# Patient Record
Sex: Female | Born: 1970 | Race: White | Hispanic: No | Marital: Married | Smoking: Never smoker
Health system: Southern US, Community
[De-identification: ages and names within clinical notes are randomized; demographics above are authoritative.]

## PROBLEM LIST (undated history)

## (undated) DIAGNOSIS — G4700 Insomnia, unspecified: Secondary | ICD-10-CM

## (undated) DIAGNOSIS — G8929 Other chronic pain: Secondary | ICD-10-CM

## (undated) DIAGNOSIS — S86112A Strain of other muscle(s) and tendon(s) of posterior muscle group at lower leg level, left leg, initial encounter: Secondary | ICD-10-CM

## (undated) DIAGNOSIS — F419 Anxiety disorder, unspecified: Secondary | ICD-10-CM

## (undated) DIAGNOSIS — I10 Essential (primary) hypertension: Secondary | ICD-10-CM

## (undated) HISTORY — PX: ABDOMINAL HYSTERECTOMY: SHX81

---

## 2005-08-25 ENCOUNTER — Emergency Department: Payer: Self-pay | Admitting: Emergency Medicine

## 2007-02-01 ENCOUNTER — Ambulatory Visit: Payer: Self-pay | Admitting: Family Medicine

## 2008-01-17 ENCOUNTER — Emergency Department: Payer: Self-pay | Admitting: Emergency Medicine

## 2012-08-30 ENCOUNTER — Ambulatory Visit: Payer: Self-pay | Admitting: Obstetrics & Gynecology

## 2012-08-30 LAB — CBC
HCT: 40.3 % (ref 35.0–47.0)
HGB: 13.9 g/dL (ref 12.0–16.0)
MCHC: 34.4 g/dL (ref 32.0–36.0)
MCV: 89 fL (ref 80–100)
RBC: 4.53 10*6/uL (ref 3.80–5.20)
RDW: 13.1 % (ref 11.5–14.5)

## 2012-09-11 ENCOUNTER — Ambulatory Visit: Payer: Self-pay | Admitting: Obstetrics & Gynecology

## 2012-09-12 LAB — HEMOGLOBIN: HGB: 11.7 g/dL — ABNORMAL LOW (ref 12.0–16.0)

## 2012-09-13 LAB — PATHOLOGY REPORT

## 2015-01-02 NOTE — Op Note (Signed)
PATIENT NAME:  Kelly Stephenson, Kelly L MR#:  [id number] DATE OF BIRTH:  [birth year]  DATE OF PROCEDURE:  09/11/2012  PREOPERATIVE DIAGNOSIS: Menorrhagia and fibroid uterus.  POSTOPERATIVE DIAGNOSIS: Menorrhagia and fibroid uterus.  PROCEDURE: Laparoscopic supracervical hysterectomy.   SURGEON: Dierdre Searles. Paul Breanne Olvera, MD  ASSISTANT: Kate SablePhilip Rosenow, MD  ANESTHESIA: General.   ESTIMATED BLOOD LOSS: Minimal.   COMPLICATIONS: None.   FINDINGS: Fibroid uterus, normal ovaries without cysts.   DISPOSITION: To recovery in stable condition.   TECHNIQUE: The patient is prepped and draped in the usual sterile fashion, after adequate anesthesia is obtained, in the dorsal lithotomy position. A Foley catheter is inserted and a sponge stick is placed vaginally for manipulation purposes.   Attention is then turned to the abdomen where a Veress needle is inserted through a 5 mm infraumbilical incision after Marcaine is used to anesthetize the skin. Veress needle placement is confirmed using the hanging drop technique and the abdomen is then insufflated with CO2 gas. A 5 mm trocar is then inserted under direct visualization with the laparoscope with no injuries or bleeding noted and no adhesions visualized. An 11 mm trocar is placed in the right lower quadrant and a 5 mm trocar is placed in the left lower quadrant lateral to the inferior epigastric blood vessels with no injuries or bleeding noted. The patient was placed in Trendelenburg positioning.  The  right and left uterine ovarian blood vessels and their ligaments are carefully coagulated and cut using the bipolar cautery device and then the Harmonic scalpel is used to dissect the adnexa free from the uterus. The round ligaments are carefully coagulated and cut with the Harmonic scalpel and dissection is carried down to the level of the uterine arteries. The uterine arteries are cauterized using the Kleppinger bipolar cautery device and then the bladder is inferiorly  dissected and retracted. The uterus is then amputated with approximately 1 cm of cervix left in place. The endocervical canal is cauterized. Excellent hemostasis is noted and the pelvic cavity is irrigated with aspiration of all fluid.   A morcellator device is placed through the right lower quadrant incision and the uterus is removed by morcellation technique without complication. Inspection of all organs reveals no apparent injury to ureter, bowel, bladder or other structures and all fluid is aspirated. Interceed is placed over the cervical stump. The right lower quadrant rectus fascial incision is closed with 0 Vicryl suture. Trocars are removed after gas is expelled and then the skin is closed with Dermabond. The patient goes to the recovery room in stable condition. All sponge, instrument and needle counts are correct.  ____________________________ R. Annamarie MajorPaul Thorin Starner, MD rph:sb D: 09/11/2012 08:35:13 ET T: 09/11/2012 10:23:21 ET JOB#: [id number]  cc: Dierdre Searles. Paul Orlene Salmons, MD, <Dictator> Nadara MustardOBERT P Faris Coolman MD ELECTRONICALLY SIGNED 09/12/2012 8:56

## 2015-07-21 ENCOUNTER — Emergency Department
Admission: EM | Admit: 2015-07-21 | Discharge: 2015-07-21 | Disposition: A | Discharge status: Home / Self Care | Payer: BLUE CROSS/BLUE SHIELD | Attending: Emergency Medicine | Admitting: Emergency Medicine

## 2015-07-21 ENCOUNTER — Emergency Department: Payer: BLUE CROSS/BLUE SHIELD

## 2015-07-21 DIAGNOSIS — K0889 Other specified disorders of teeth and supporting structures: Secondary | ICD-10-CM | POA: Diagnosis not present

## 2015-07-21 DIAGNOSIS — I10 Essential (primary) hypertension: Secondary | ICD-10-CM | POA: Diagnosis not present

## 2015-07-21 DIAGNOSIS — R079 Chest pain, unspecified: Secondary | ICD-10-CM

## 2015-07-21 DIAGNOSIS — G43809 Other migraine, not intractable, without status migrainosus: Secondary | ICD-10-CM

## 2015-07-21 DIAGNOSIS — H53143 Visual discomfort, bilateral: Secondary | ICD-10-CM

## 2015-07-21 DIAGNOSIS — R51 Headache: Secondary | ICD-10-CM | POA: Diagnosis present

## 2015-07-21 HISTORY — DX: Essential (primary) hypertension: I10

## 2015-07-21 LAB — COMPREHENSIVE METABOLIC PANEL
ALBUMIN: 4.1 g/dL (ref 3.5–5.0)
ALT: 17 U/L (ref 14–54)
ANION GAP: 8 (ref 5–15)
AST: 18 U/L (ref 15–41)
Alkaline Phosphatase: 77 U/L (ref 38–126)
BUN: 11 mg/dL (ref 6–20)
CO2: 25 mmol/L (ref 22–32)
Calcium: 9.1 mg/dL (ref 8.9–10.3)
Chloride: 104 mmol/L (ref 101–111)
Creatinine, Ser: 0.72 mg/dL (ref 0.44–1.00)
GFR calc Af Amer: >60 mL/min (ref >60)
GFR calc non Af Amer: >60 mL/min (ref >60)
GLUCOSE: 100 mg/dL — AB (ref 65–99)
Potassium: 3.8 mmol/L (ref 3.5–5.1)
SODIUM: 137 mmol/L (ref 135–145)
Total Bilirubin: 0.6 mg/dL (ref 0.3–1.2)
Total Protein: 7.4 g/dL (ref 6.5–8.1)

## 2015-07-21 LAB — CBC
HCT: 42.2 % (ref 35.0–47.0)
HEMOGLOBIN: 14.2 g/dL (ref 12.0–16.0)
MCH: 28.9 pg (ref 26.0–34.0)
MCHC: 33.8 g/dL (ref 32.0–36.0)
MCV: 85.6 fL (ref 80.0–100.0)
Platelets: 250 10*3/uL (ref 150–440)
RBC: 4.92 MIL/uL (ref 3.80–5.20)
RDW: 12.8 % (ref 11.5–14.5)
WBC: 8.5 10*3/uL (ref 3.6–11.0)

## 2015-07-21 LAB — TROPONIN I: Troponin I: <0.03 ng/mL (ref <0.031)

## 2015-07-21 MED ORDER — GI COCKTAIL ~~LOC~~
30.0000 mL | Freq: Once | ORAL | Status: AC
  Administered 2015-07-21: 30 mL via ORAL

## 2015-07-21 MED ORDER — KETOROLAC TROMETHAMINE 60 MG/2ML IM SOLN
INTRAMUSCULAR | Status: AC
  Administered 2015-07-21: 60 mg
  Filled 2015-07-21: qty 2

## 2015-07-21 MED ORDER — KETOROLAC TROMETHAMINE 10 MG PO TABS: 10.0000 mg | ORAL_TABLET | Freq: Three times a day (TID) | ORAL | Status: DC | PRN

## 2015-07-21 MED ORDER — KETOROLAC TROMETHAMINE 30 MG/ML IJ SOLN: 60.0000 mg | Freq: Once | INTRAMUSCULAR | Status: AC

## 2015-07-21 MED ORDER — ONDANSETRON 4 MG PO TBDP
4.0000 mg | ORAL_TABLET | Freq: Once | ORAL | Status: AC
  Administered 2015-07-21: 4 mg via ORAL

## 2015-07-21 MED ORDER — GI COCKTAIL ~~LOC~~
ORAL | Status: AC
  Filled 2015-07-21: qty 30

## 2015-07-21 MED ORDER — ONDANSETRON 4 MG PO TBDP
ORAL_TABLET | ORAL | Status: AC
  Filled 2015-07-21: qty 1

## 2015-07-21 NOTE — Discharge Instructions (Signed)
Please return to the emergency department if he develops severe pain, nausea or vomiting, chest pain, shortness of breath, palpitations, fainting, fever or any other symptoms concerning to you.

## 2015-07-21 NOTE — ED Notes (Signed)
New onset of chest pain that started 1 hour ago.  Patient with history of hypertension. Patient denies nausea or vomiting but does report shortness of breath.

## 2015-07-21 NOTE — ED Provider Notes (Signed)
[facility] Emergency Department Provider Note  ____________________________________________  Time seen: Approximately 7:15 PM  I have reviewed the triage vital signs and the nursing notes.   HISTORY  Chief Complaint Chest Pain    HPI Kelly Stephenson is a 44 y.o. female with a history of migraines presenting with headache and chest pain. Patient reports that earlier today she developed a progressively worsening headache with associated photophobia. She did not have any nausea or vomiting, fever, neck stiffness, trauma area did this felt similar to her previous headaches. Now, she has developed some pain in tooth #15. Later on in the day she developed some midsternal chest "tightness" without associated diaphoresis, nausea or vomiting, or shortness of breath. It did not radiate. It was not worse with deep breaths. She has not had any cough or cold symptoms, fever or chills. She had a large meal from this given L for lunch prior to the onset of her chest pain.   Past Medical History  Diagnosis Date  . Hypertension     There are no active problems to display for this patient.  family history is significant for father who died of an MI at age 44.  Social history is significant for no smoking, very occasional alcohol, no cocaine.  History reviewed. No pertinent past surgical history.  Current Outpatient Rx  Name  Route  Sig  Dispense  Refill  . ketorolac (TORADOL) 10 MG tablet   Oral   Take 1 tablet (10 mg total) by mouth every 8 (eight) hours as needed for moderate pain (with food).   15 tablet   0     Allergies Review of patient's allergies indicates no known allergies.  History reviewed. No pertinent family history.  Social History Social History  Substance Use Topics  . Smoking status: Never Smoker   . Smokeless tobacco: None  . Alcohol Use: No    Review of Systems Constitutional: No fever/chills. No lightheadedness or syncope. Eyes: No  visual changes. No blurred or double vision. ENT: No sore throat. Cardiovascular: Positive chest pain, negative palpitations. Respiratory: Denies shortness of breath.  No cough. Gastrointestinal: No abdominal pain.  No nausea, no vomiting.  No diarrhea.  No constipation. Genitourinary: Negative for dysuria. Musculoskeletal: Negative for back pain. Skin: Negative for rash. Neurological: Positive for headaches, negative focal weakness or numbness. No visual or speech changes. No difficulty walking.  10-point ROS otherwise negative.  ____________________________________________   PHYSICAL EXAM:  VITAL SIGNS: ED Triage Vitals  Enc Vitals Group     BP 07/21/15 1703 182/103 mmHg     Pulse Rate 07/21/15 1703 89     Resp 07/21/15 1703 18     Temp 07/21/15 1703 97.8 F (36.6 C)     Temp src --      SpO2 07/21/15 1703 99 %     Weight 07/21/15 1703 175 lb (79.379 kg)     Height 07/21/15 1703 5\' 4"  (1.626 m)     Head Cir --      Peak Flow --      Pain Score 07/21/15 1704 5     Pain Loc --      Pain Edu? --      Excl. in GC? --     Constitutional: Patient is lying in the stretcher mildly uncomfortable. She is alert and oriented and able to answer questions appropriately.  Eyes: Conjunctivae are normal.  EOMI. no nystagmus. PERRLA. Head: Atraumatic.  Nose: No congestion/rhinnorhea. Mouth/Throat: Mucous membranes  are moist. Tooth #15 has some mild discoloration but no evidence of abscess. The patient has no trismus or drooling. No posterior pharyngeal erythema, tonsillar swelling or exudate. Uvula is midline.  Neck: No stridor.  Supple.  No meningismus. Cardiovascular: Normal rate, regular rhythm. No murmurs, rubs or gallops.  Respiratory: Normal respiratory effort.  No retractions. Lungs CTAB.  No wheezes, rales or ronchi. Gastrointestinal: Soft and nontender. No distention. No peritoneal signs. Musculoskeletal: No LE edema.  Neurologic:  Normal speech and language. No gross focal  neurologic deficits are appreciated.  Skin:  Skin is warm, dry and intact. No rash noted. Psychiatric: Mood and affect are normal. Speech and behavior are normal.  Normal judgement.  ____________________________________________   LABS (all labs ordered are listed, but only abnormal results are displayed)  Labs Reviewed  COMPREHENSIVE METABOLIC PANEL - Abnormal; Notable for the following:    Glucose, Bld 100 (*)    All other components within normal limits  CBC  TROPONIN I   ____________________________________________  EKG  ED ECG REPORT I, Rockne Menghini, the attending physician, personally viewed and interpreted this ECG.   Date: 07/21/2015  EKG Time: 1701  Rate: 88  Rhythm: normal sinus rhythm  Axis: Normal  Intervals:none  ST&T Change: Nonspecific T-wave inversions in V1.  ____________________________________________  RADIOLOGY  Dg Chest 2 View  07/21/2015  CLINICAL DATA:  New onset chest pain 1 hour ago. History of hypertension. EXAM: CHEST  2 VIEW COMPARISON:  None. FINDINGS: The cardiac silhouette, mediastinal and hilar contours are normal. The lungs are clear. No pleural effusion. The bony thorax is intact. IMPRESSION: No acute cardiopulmonary findings. Electronically Signed   By: Rudie Meyer M.D.   On: 07/21/2015 17:48    ____________________________________________   PROCEDURES  Procedure(s) performed: None  Critical Care performed: No ____________________________________________   INITIAL IMPRESSION / ASSESSMENT AND PLAN / ED COURSE  Pertinent labs & imaging results that were available during my care of the patient were reviewed by me and considered in my medical decision making (see chart for details).  44 y.o. female with no significant cardiac risk factors other than apparent with early CAD presenting with substernal chest pain after the onset of a typical headache. Clinically it does not appear that the patient has risk for meningitis or  subarachnoid hemorrhage. Her EKG is reassuring for no ischemic changes. She has negative chest x-ray. I will plan symptomatic treatment and if she improves, plan discharge home with close PMD follow-up. I have recommended that she get an outpatient stress test for risk stratification.  ----------------------------------------- 8:13 PM on 07/21/2015 -----------------------------------------  The patient's headache is significantly improved and now down to a 3 out of 10. Her nausea has resolved. And her chest pain is completely gone after GI cocktail. Given that this is the first on the patient has had any chest discomfort, I will not start her on a PPI or H2 blocker. However I did tell her that if she persists in her chest pain that she needs to follow up with her primary care physician for further evaluation. I will talk to the cardiologist on-call to help the patient set up an outpatient stress test. Plan discharge. Patient and her husband understand follow-up instructions and return precautions.  ____________________________________________  FINAL CLINICAL IMPRESSION(S) / ED DIAGNOSES  Final diagnoses:  Chest pain, unspecified chest pain type  Other migraine without status migrainosus, not intractable  Photophobia of both eyes      NEW MEDICATIONS STARTED DURING THIS VISIT:  New Prescriptions   KETOROLAC (TORADOL) 10 MG TABLET    Take 1 tablet (10 mg total) by mouth every 8 (eight) hours as needed for moderate pain (with food).     Rockne Menghini, MD 07/21/15 2016

## 2019-11-21 DIAGNOSIS — R519 Headache, unspecified: Secondary | ICD-10-CM | POA: Diagnosis not present

## 2019-11-21 DIAGNOSIS — M62830 Muscle spasm of back: Secondary | ICD-10-CM | POA: Diagnosis not present

## 2019-11-21 DIAGNOSIS — M9902 Segmental and somatic dysfunction of thoracic region: Secondary | ICD-10-CM | POA: Diagnosis not present

## 2019-11-21 DIAGNOSIS — M9901 Segmental and somatic dysfunction of cervical region: Secondary | ICD-10-CM | POA: Diagnosis not present

## 2020-07-15 DIAGNOSIS — M9902 Segmental and somatic dysfunction of thoracic region: Secondary | ICD-10-CM | POA: Diagnosis not present

## 2020-07-15 DIAGNOSIS — M62830 Muscle spasm of back: Secondary | ICD-10-CM | POA: Diagnosis not present

## 2020-07-15 DIAGNOSIS — M9903 Segmental and somatic dysfunction of lumbar region: Secondary | ICD-10-CM | POA: Diagnosis not present

## 2020-07-15 DIAGNOSIS — M9907 Segmental and somatic dysfunction of upper extremity: Secondary | ICD-10-CM | POA: Diagnosis not present

## 2020-07-15 DIAGNOSIS — R293 Abnormal posture: Secondary | ICD-10-CM | POA: Diagnosis not present

## 2020-07-15 DIAGNOSIS — M62411 Contracture of muscle, right shoulder: Secondary | ICD-10-CM | POA: Diagnosis not present

## 2020-07-16 DIAGNOSIS — M9902 Segmental and somatic dysfunction of thoracic region: Secondary | ICD-10-CM | POA: Diagnosis not present

## 2020-07-16 DIAGNOSIS — M62830 Muscle spasm of back: Secondary | ICD-10-CM | POA: Diagnosis not present

## 2020-07-16 DIAGNOSIS — R293 Abnormal posture: Secondary | ICD-10-CM | POA: Diagnosis not present

## 2020-07-16 DIAGNOSIS — M9907 Segmental and somatic dysfunction of upper extremity: Secondary | ICD-10-CM | POA: Diagnosis not present

## 2020-07-16 DIAGNOSIS — M62411 Contracture of muscle, right shoulder: Secondary | ICD-10-CM | POA: Diagnosis not present

## 2020-07-16 DIAGNOSIS — M9903 Segmental and somatic dysfunction of lumbar region: Secondary | ICD-10-CM | POA: Diagnosis not present

## 2020-07-20 DIAGNOSIS — M62411 Contracture of muscle, right shoulder: Secondary | ICD-10-CM | POA: Diagnosis not present

## 2020-07-20 DIAGNOSIS — R293 Abnormal posture: Secondary | ICD-10-CM | POA: Diagnosis not present

## 2020-07-20 DIAGNOSIS — M9907 Segmental and somatic dysfunction of upper extremity: Secondary | ICD-10-CM | POA: Diagnosis not present

## 2020-07-20 DIAGNOSIS — M9903 Segmental and somatic dysfunction of lumbar region: Secondary | ICD-10-CM | POA: Diagnosis not present

## 2020-07-20 DIAGNOSIS — M9902 Segmental and somatic dysfunction of thoracic region: Secondary | ICD-10-CM | POA: Diagnosis not present

## 2020-07-20 DIAGNOSIS — M62830 Muscle spasm of back: Secondary | ICD-10-CM | POA: Diagnosis not present

## 2020-07-23 DIAGNOSIS — M9907 Segmental and somatic dysfunction of upper extremity: Secondary | ICD-10-CM | POA: Diagnosis not present

## 2020-07-23 DIAGNOSIS — M9902 Segmental and somatic dysfunction of thoracic region: Secondary | ICD-10-CM | POA: Diagnosis not present

## 2020-07-23 DIAGNOSIS — M9903 Segmental and somatic dysfunction of lumbar region: Secondary | ICD-10-CM | POA: Diagnosis not present

## 2020-07-23 DIAGNOSIS — R293 Abnormal posture: Secondary | ICD-10-CM | POA: Diagnosis not present

## 2020-07-23 DIAGNOSIS — M62411 Contracture of muscle, right shoulder: Secondary | ICD-10-CM | POA: Diagnosis not present

## 2020-07-23 DIAGNOSIS — M62830 Muscle spasm of back: Secondary | ICD-10-CM | POA: Diagnosis not present

## 2020-07-27 DIAGNOSIS — M62411 Contracture of muscle, right shoulder: Secondary | ICD-10-CM | POA: Diagnosis not present

## 2020-07-27 DIAGNOSIS — M62830 Muscle spasm of back: Secondary | ICD-10-CM | POA: Diagnosis not present

## 2020-07-27 DIAGNOSIS — M9903 Segmental and somatic dysfunction of lumbar region: Secondary | ICD-10-CM | POA: Diagnosis not present

## 2020-07-27 DIAGNOSIS — R293 Abnormal posture: Secondary | ICD-10-CM | POA: Diagnosis not present

## 2020-07-27 DIAGNOSIS — M9907 Segmental and somatic dysfunction of upper extremity: Secondary | ICD-10-CM | POA: Diagnosis not present

## 2020-07-27 DIAGNOSIS — M9902 Segmental and somatic dysfunction of thoracic region: Secondary | ICD-10-CM | POA: Diagnosis not present

## 2020-08-04 DIAGNOSIS — M62830 Muscle spasm of back: Secondary | ICD-10-CM | POA: Diagnosis not present

## 2020-08-04 DIAGNOSIS — M9907 Segmental and somatic dysfunction of upper extremity: Secondary | ICD-10-CM | POA: Diagnosis not present

## 2020-08-04 DIAGNOSIS — M9903 Segmental and somatic dysfunction of lumbar region: Secondary | ICD-10-CM | POA: Diagnosis not present

## 2020-08-04 DIAGNOSIS — M9902 Segmental and somatic dysfunction of thoracic region: Secondary | ICD-10-CM | POA: Diagnosis not present

## 2020-08-04 DIAGNOSIS — R293 Abnormal posture: Secondary | ICD-10-CM | POA: Diagnosis not present

## 2020-08-04 DIAGNOSIS — M62411 Contracture of muscle, right shoulder: Secondary | ICD-10-CM | POA: Diagnosis not present

## 2020-08-11 DIAGNOSIS — M9902 Segmental and somatic dysfunction of thoracic region: Secondary | ICD-10-CM | POA: Diagnosis not present

## 2020-08-11 DIAGNOSIS — M9903 Segmental and somatic dysfunction of lumbar region: Secondary | ICD-10-CM | POA: Diagnosis not present

## 2020-08-11 DIAGNOSIS — M9907 Segmental and somatic dysfunction of upper extremity: Secondary | ICD-10-CM | POA: Diagnosis not present

## 2020-08-11 DIAGNOSIS — R293 Abnormal posture: Secondary | ICD-10-CM | POA: Diagnosis not present

## 2020-08-11 DIAGNOSIS — M62411 Contracture of muscle, right shoulder: Secondary | ICD-10-CM | POA: Diagnosis not present

## 2020-08-11 DIAGNOSIS — M62830 Muscle spasm of back: Secondary | ICD-10-CM | POA: Diagnosis not present

## 2020-08-18 DIAGNOSIS — R293 Abnormal posture: Secondary | ICD-10-CM | POA: Diagnosis not present

## 2020-08-18 DIAGNOSIS — M62411 Contracture of muscle, right shoulder: Secondary | ICD-10-CM | POA: Diagnosis not present

## 2020-08-18 DIAGNOSIS — M9903 Segmental and somatic dysfunction of lumbar region: Secondary | ICD-10-CM | POA: Diagnosis not present

## 2020-08-18 DIAGNOSIS — M62830 Muscle spasm of back: Secondary | ICD-10-CM | POA: Diagnosis not present

## 2020-08-18 DIAGNOSIS — M9907 Segmental and somatic dysfunction of upper extremity: Secondary | ICD-10-CM | POA: Diagnosis not present

## 2020-08-18 DIAGNOSIS — M9902 Segmental and somatic dysfunction of thoracic region: Secondary | ICD-10-CM | POA: Diagnosis not present

## 2020-10-07 ENCOUNTER — Telehealth: Payer: Self-pay | Admitting: Family Medicine

## 2020-10-07 ENCOUNTER — Ambulatory Visit: Payer: Self-pay | Admitting: *Deleted

## 2020-10-07 NOTE — Telephone Encounter (Signed)
Copied from CRM [id number]. Topic: Appointment Scheduling - Scheduling Inquiry for Clinic >> Oct 07, 2020 12:00 PM Randol Kern wrote: Reason for CRM: Pt is a former patient of Dr. Sherrie Mustache and would like to reestablish Best contact: [phone]

## 2020-10-07 NOTE — Telephone Encounter (Signed)
Positive covid . Caller requesting to review quarantine guidelines. Called patient and no answer, left voicemail to call back at #[phone].

## 2020-10-07 NOTE — Telephone Encounter (Signed)
That's fine

## 2020-10-07 NOTE — Telephone Encounter (Signed)
Attempted to contact patient Left VM to return call to [phone] to speak with a nurse abut her questions.Marland Kitchen

## 2020-10-08 ENCOUNTER — Ambulatory Visit (INDEPENDENT_AMBULATORY_CARE_PROVIDER_SITE_OTHER): Payer: BC Managed Care – PPO | Admitting: Family Medicine

## 2020-10-08 ENCOUNTER — Encounter: Payer: Self-pay | Admitting: Family Medicine

## 2020-10-08 VITALS — Ht 64.0 in | Wt 183.0 lb

## 2020-10-08 DIAGNOSIS — U071 COVID-19: Secondary | ICD-10-CM | POA: Diagnosis not present

## 2020-10-08 DIAGNOSIS — I10 Essential (primary) hypertension: Secondary | ICD-10-CM

## 2020-10-08 NOTE — Progress Notes (Signed)
MyChart Video Visit    Virtual Visit via Video Note   This visit type was conducted due to national recommendations for restrictions regarding the COVID-19 Pandemic (e.g. social distancing) in an effort to limit this patient's exposure and mitigate transmission in our community. This patient is at least at moderate risk for complications without adequate follow up. This format is felt to be most appropriate for this patient at this time. Physical exam was limited by quality of the video and audio technology used for the visit.   Patient location: Home Provider location: Home  I discussed the limitations of evaluation and management by telemedicine and the availability of in person appointments. The patient expressed understanding and agreed to proceed.  Patient: Kelly Stephenson   DOB: Apr 30, 1971   50 y.o. Female  MRN: [id number] Visit Date: 10/08/2020  Today's healthcare provider: Shirlee Latch, MD   Chief Complaint  Patient presents with  . Covid Positive   Subjective    HPI   Kelly Stephenson presents for follow up of her COVID symptoms. She is not vaccinated. She was exposed to COVID at her work and first noticed a sore throat thursday 1/20. The next day she reported a significant headache that last two days and was relieved by Excedrin Migraine. Also reported some fatigue and congestion. Over the last week her symptoms have improved and mostly resolved. Today, she only notes some facial pressure and congestion. She is concerned if she is doing everything right and would like more information on the risks of it progressing to a severe infection.   She would also like to reestablish care at [facility] with former PCP Dr Sherrie Mustache.    Past Medical History:  Diagnosis Date  . Hypertension     Medications: Outpatient Medications Prior to Visit  Medication Sig  . [DISCONTINUED] ketorolac (TORADOL) 10 MG tablet Take 1 tablet (10 mg total) by mouth every 8 (eight) hours as needed for  moderate pain (with food).   No facility-administered medications prior to visit.   Family History  Problem Relation Age of Onset  . High blood pressure Father   . Heart Problems Father    Past Surgical History:  Procedure Laterality Date  . ABDOMINAL HYSTERECTOMY     Social History   Substance and Sexual Activity  Sexual Activity Not on file   Tobacco Use: Low Risk   . Smoking Tobacco Use: Never Smoker  . Smokeless Tobacco Use: Never Used    Review of Systems  Constitutional: Positive for fatigue.  HENT: Positive for congestion, postnasal drip, rhinorrhea and sinus pressure.       Objective    Ht 5\' 4"  (1.626 m)   Wt 183 lb (83 kg)   BMI 31.41 kg/m    Physical Exam   General: Well appearing woman in her living room with her dog on camera. Oriented to conversation and speaking in full sentences without breaks Lungs: Normal Work of breathing Cardiac: extremities appear pink and well-perfused Abdomen: non-distended Derm: No apparent rashes  Psych: Alert and Oriented, mood and affect appropriate to situation.    Assessment & Plan     Problem List Items Addressed This Visit      Other   COVID-19 - Primary    One week since onset of symptoms Confirmed by + PCR Only mild congestion and fatigue persist Counseled on natural course, symptomatic management, and return precautions Discussed quarantine window       Other Visit Diagnoses  Primary hypertension        - will f/u and repeat at next office visit in person.  Not currently on any meds.   Return in about 2 months (around 12/06/2020) for CPE.     I discussed the assessment and treatment plan with the patient. The patient was provided an opportunity to ask questions and all were answered. The patient agreed with the plan and demonstrated an understanding of the instructions.   The patient was advised to call back or seek an in-person evaluation if the symptoms worsen or if the condition fails to  improve as anticipated.  I provided 30 minutes of non-face-to-face time during this encounter.  Patient seen along with MS[employer] student [facility]. I personally evaluated this patient along with the student, and verified all aspects of the history, physical exam, and medical decision making as documented by the student. I agree with the student's documentation and have made all necessary edits.  Saraann Enneking, Marzella Schlein, MD, MPH [facility]

## 2020-10-08 NOTE — Patient Instructions (Signed)

## 2020-10-08 NOTE — Progress Notes (Signed)
See note from Preston Roche, medical student, attested by me from same date of service 

## 2020-10-08 NOTE — Assessment & Plan Note (Addendum)
One week since onset of symptoms Confirmed by + PCR Only mild congestion and fatigue persist Counseled on natural course, symptomatic management, and return precautions Discussed quarantine window

## 2020-10-12 ENCOUNTER — Ambulatory Visit: Payer: Self-pay | Admitting: Family Medicine

## 2020-11-16 ENCOUNTER — Ambulatory Visit: Payer: Self-pay | Admitting: Family Medicine

## 2020-11-27 ENCOUNTER — Encounter: Payer: Self-pay | Admitting: Family Medicine

## 2020-11-27 ENCOUNTER — Other Ambulatory Visit: Payer: Self-pay

## 2020-11-27 ENCOUNTER — Ambulatory Visit (INDEPENDENT_AMBULATORY_CARE_PROVIDER_SITE_OTHER): Payer: BC Managed Care – PPO | Admitting: Family Medicine

## 2020-11-27 VITALS — BP 183/115 | HR 96 | Temp 97.5°F | Ht 64.0 in | Wt 191.0 lb

## 2020-11-27 DIAGNOSIS — F5101 Primary insomnia: Secondary | ICD-10-CM

## 2020-11-27 DIAGNOSIS — R4586 Emotional lability: Secondary | ICD-10-CM

## 2020-11-27 DIAGNOSIS — R030 Elevated blood-pressure reading, without diagnosis of hypertension: Secondary | ICD-10-CM | POA: Diagnosis not present

## 2020-11-27 DIAGNOSIS — R61 Generalized hyperhidrosis: Secondary | ICD-10-CM

## 2020-11-27 DIAGNOSIS — F419 Anxiety disorder, unspecified: Secondary | ICD-10-CM

## 2020-11-27 DIAGNOSIS — R221 Localized swelling, mass and lump, neck: Secondary | ICD-10-CM

## 2020-11-27 MED ORDER — METOPROLOL SUCCINATE ER 25 MG PO TB24: 25.0000 mg | ORAL_TABLET | Freq: Every day | ORAL | 3 refills | Status: DC

## 2020-11-27 NOTE — Progress Notes (Signed)
New patient visit   Patient: Kelly Stephenson   DOB: [birth year]   50 y.o. Female  MRN: [id number] Visit Date: 11/27/2020  Today's healthcare provider: Mila Merry, MD   No chief complaint on file.  Subjective    Kelly Stephenson is a 50 y.o. female who presents today as a new patient to reestablish care.  HPI  Pt would like to discuss difficulty sleeping. She states she has had nervousness, insomnia, racing heart, palpitations, night sweats and mood swings for several months. She had covid in January but sx preceded that. She has also felt lump in her throat when she swallows and is tender in the front of her neck. She states he appetite goes up and down. She did lose about 20 pounds a few months ago but has gained it all back, including about 15 pounds in the the last month.   Past Medical History:  Diagnosis Date  . Hypertension    Past Surgical History:  Procedure Laterality Date  . ABDOMINAL HYSTERECTOMY     Family Status  Relation Name Status  . Mother  Alive  . Father  Deceased at age 91       heart attack  . Sister  Alive  . Brother  Alive  . Daughter  Alive  . Son  Alive  . Sister  Alive  . Daughter  Alive   Family History  Problem Relation Age of Onset  . High blood pressure Father   . Heart Problems Father    Social History   Socioeconomic History  . Marital status: Married    Spouse name: Not on file  . Number of children: Not on file  . Years of education: Not on file  . Highest education level: Not on file  Occupational History  . Not on file  Tobacco Use  . Smoking status: Never Smoker  . Smokeless tobacco: Never Used  Vaping Use  . Vaping Use: Never used  Substance and Sexual Activity  . Alcohol use: Yes    Alcohol/week: 1.0 standard drink    Types: 1 Glasses of wine per week  . Drug use: No  . Sexual activity: Not on file  Other Topics Concern  . Not on file  Social History Narrative   3 children   Social Determinants of Health    Financial Resource Strain: Not on file  Food Insecurity: Not on file  Transportation Needs: Not on file  Physical Activity: Not on file  Stress: Not on file  Social Connections: Not on file   No outpatient medications prior to visit.   No facility-administered medications prior to visit.   No Known Allergies   There is no immunization history on file for this patient.  Health Maintenance  Topic Date Due  . Hepatitis C Screening  Never done  . COVID-19 Vaccine (1) Never done  . HIV Screening  Never done  . PAP SMEAR-Modifier  Never done  . TETANUS/TDAP  09/12/2014  . COLONOSCOPY (Pts 45-95yrs Insurance coverage will need to be confirmed)  Never done  . INFLUENZA VACCINE  12/10/2020 (Originally 04/12/2020)  . HPV VACCINES  Aged Out    Patient Care Team: Malva Limes, MD as PCP - General (Family Medicine)  Review of Systems  Constitutional: Positive for diaphoresis, fatigue, fever and unexpected weight change.  Eyes: Positive for redness.  Respiratory: Positive for apnea, cough, choking, chest tightness and shortness of breath.   Cardiovascular: Positive for chest pain and  palpitations.  Gastrointestinal: Positive for abdominal distention, blood in stool, constipation, diarrhea, nausea, rectal pain and vomiting.  Endocrine: Positive for polydipsia.  Neurological: Positive for dizziness, speech difficulty, light-headedness and headaches.  Psychiatric/Behavioral: Positive for agitation, decreased concentration, dysphoric mood and sleep disturbance. The patient is nervous/anxious.   All other systems reviewed and are negative.     Objective    BP (!) 183/115 (BP Location: Right Arm, Patient Position: Sitting, Cuff Size: Normal)   Pulse 96   Temp (!) 97.5 F (36.4 C) (Temporal)   Ht 5\' 4"  (1.626 m)   Wt 191 lb (86.6 kg)   SpO2 98%   BMI 32.79 kg/m  Physical Exam   General: Appearance:    Mildly obese female in no acute distress  Neck:   Slight fullness and  moderate tenderness of thyroid.   Eyes:    PERRL, conjunctiva/corneas clear, EOM's intact       Lungs:     Clear to auscultation bilaterally, respirations unlabored  Heart:    Normal heart rate. Normal rhythm. No murmurs, rubs, or gallops.   MS:   All extremities are intact.   Neurologic:   Awake, alert, oriented x 3. No apparent focal neurological           defect.        Assessment & Plan      1. Elevated blood pressure reading Differential diagnosis includes essential hypertension, thyroid disorder, perimenopausal, and secondary hypertension.  - EKG 12-Lead - CBC - Comprehensive metabolic panel - TSH - T4, free - FSH  Start - metoprolol succinate (TOPROL-XL) 25 MG 24 hr tablet; Take 1 tablet (25 mg total) by mouth daily.  Dispense: 90 tablet; Refill: 3  Consider addition of calcium channel or alpha-blocker after reviewing labs.    2. Night sweats  - Cortisol  3. Anxiety   4. Primary insomnia   5. Lump in throat   6. Mood swings       The entirety of the information documented in the History of Present Illness, Review of Systems and Physical Exam were personally obtained by me. Portions of this information were initially documented by the CMA and reviewed by me for thoroughness and accuracy.      , MD  [facility] [phone] (phone) [phone] (fax)  [facility]

## 2020-11-28 LAB — COMPREHENSIVE METABOLIC PANEL
ALT: 26 IU/L (ref 0–32)
AST: 22 IU/L (ref 0–40)
Albumin/Globulin Ratio: 1.7 (ref 1.2–2.2)
Albumin: 4.4 g/dL (ref 3.8–4.8)
Alkaline Phosphatase: 130 IU/L — ABNORMAL HIGH (ref 44–121)
BUN/Creatinine Ratio: 15 (ref 9–23)
BUN: 14 mg/dL (ref 6–24)
Bilirubin Total: 0.4 mg/dL (ref 0.0–1.2)
CO2: 21 mmol/L (ref 20–29)
Calcium: 9.3 mg/dL (ref 8.7–10.2)
Chloride: 101 mmol/L (ref 96–106)
Creatinine, Ser: 0.92 mg/dL (ref 0.57–1.00)
Globulin, Total: 2.6 g/dL (ref 1.5–4.5)
Glucose: 95 mg/dL (ref 65–99)
Potassium: 4.1 mmol/L (ref 3.5–5.2)
Sodium: 138 mmol/L (ref 134–144)
Total Protein: 7 g/dL (ref 6.0–8.5)
eGFR: 76 mL/min/{1.73_m2} (ref >59)

## 2020-11-28 LAB — CBC
Hematocrit: 44.1 % (ref 34.0–46.6)
Hemoglobin: 14.6 g/dL (ref 11.1–15.9)
MCH: 28 pg (ref 26.6–33.0)
MCHC: 33.1 g/dL (ref 31.5–35.7)
MCV: 85 fL (ref 79–97)
Platelets: 305 10*3/uL (ref 150–450)
RBC: 5.22 x10E6/uL (ref 3.77–5.28)
RDW: 13.6 % (ref 11.7–15.4)
WBC: 7.8 10*3/uL (ref 3.4–10.8)

## 2020-11-28 LAB — T4, FREE: Free T4: 1.29 ng/dL (ref 0.82–1.77)

## 2020-11-28 LAB — FOLLICLE STIMULATING HORMONE: FSH: 84.6 m[IU]/mL

## 2020-11-28 LAB — TSH: TSH: 2.01 u[IU]/mL (ref 0.450–4.500)

## 2020-11-28 LAB — CORTISOL: Cortisol: 8.8 ug/dL

## 2020-11-30 ENCOUNTER — Telehealth: Payer: Self-pay

## 2020-11-30 NOTE — Telephone Encounter (Signed)
Patient notified of lab results and PCP recommendation- she voices understanding

## 2020-11-30 NOTE — Telephone Encounter (Signed)
-----   Message from Malva Limes, MD sent at 11/30/2020  7:56 AM EDT ----- Labs are all completely normal. She should check blood pressure once daily and follow up her as scheduled next week.  Let me know if she sees any blood pressure over 200/120 before then

## 2020-12-11 ENCOUNTER — Ambulatory Visit
Admission: RE | Admit: 2020-12-11 | Discharge: 2020-12-11 | Disposition: A | Discharge status: Home / Self Care | Payer: BC Managed Care – PPO | Attending: Family Medicine | Admitting: Family Medicine

## 2020-12-11 ENCOUNTER — Other Ambulatory Visit: Payer: Self-pay

## 2020-12-11 ENCOUNTER — Ambulatory Visit
Admission: RE | Admit: 2020-12-11 | Discharge: 2020-12-11 | Disposition: A | Discharge status: Home / Self Care | Payer: BC Managed Care – PPO | Source: Ambulatory Visit | Attending: Family Medicine | Admitting: Family Medicine

## 2020-12-11 ENCOUNTER — Ambulatory Visit (INDEPENDENT_AMBULATORY_CARE_PROVIDER_SITE_OTHER): Payer: BC Managed Care – PPO | Admitting: Family Medicine

## 2020-12-11 VITALS — BP 168/108 | HR 88 | Ht 65.0 in | Wt 188.8 lb

## 2020-12-11 DIAGNOSIS — L409 Psoriasis, unspecified: Secondary | ICD-10-CM | POA: Insufficient documentation

## 2020-12-11 DIAGNOSIS — R079 Chest pain, unspecified: Secondary | ICD-10-CM | POA: Insufficient documentation

## 2020-12-11 DIAGNOSIS — R030 Elevated blood-pressure reading, without diagnosis of hypertension: Secondary | ICD-10-CM | POA: Diagnosis not present

## 2020-12-11 DIAGNOSIS — I10 Essential (primary) hypertension: Secondary | ICD-10-CM | POA: Diagnosis not present

## 2020-12-11 MED ORDER — AMLODIPINE BESYLATE 5 MG PO TABS: 5.0000 mg | ORAL_TABLET | Freq: Every day | ORAL | 3 refills | Status: DC

## 2020-12-11 MED ORDER — TRIAMCINOLONE ACETONIDE 0.5 % EX OINT: 1.0000 "application " | TOPICAL_OINTMENT | Freq: Two times a day (BID) | CUTANEOUS | 0 refills | Status: DC

## 2020-12-11 NOTE — Progress Notes (Signed)
Established patient visit   Patient: Kelly Stephenson   DOB: 11-Aug-1971   50 y.o. Female  MRN: [id number] Visit Date: 12/11/2020  Today's healthcare provider: Mila Merry, MD   No chief complaint on file.  Subjective    HPI  Hypertension, follow-up  BP Readings from Last 3 Encounters:  11/27/20 (!) 183/115  07/21/15 (!) 151/91   Wt Readings from Last 3 Encounters:  11/27/20 191 lb (86.6 kg)  10/08/20 183 lb (83 kg)  07/21/15 175 lb (79.4 kg)     She was last seen for hypertension 2 weeks ago.  BP at that visit was 183/115. Management since that visit includes starting metoprolol succinate (TOPROL-XL) 25 MG 24 hr tablet; Take 1 tablet (25 mg total) by mouth daily. She reports excellent compliance with treatment. She is not having side effects.  She is following a Regular diet. She is exercising. She does not smoke.  Use of agents associated with hypertension: none.    Outside blood pressures are n/a. Symptoms: Yes chest pain Yes chest pressure  Yes palpitations Yes syncope  Yes dyspnea No orthopnea  No paroxysmal nocturnal dyspnea No lower extremity edema   Pertinent labs: No results found for: CHOL, HDL, LDLCALC, LDLDIRECT, TRIG, CHOLHDL Lab Results  Component Value Date   NA 138 11/27/2020   K 4.1 11/27/2020   CREATININE 0.92 11/27/2020   GFRNONAA >60 07/21/2015   GFRAA >60 07/21/2015   GLUCOSE 95 11/27/2020     The ASCVD Risk score Denman George DC Jr., et al., 2013) failed to calculate for the following reasons:   Cannot find a previous HDL lab   Cannot find a previous total cholesterol lab   She also reports having frequent episodes of substernal chest pain sometimes radiating to right. No specific triggers. Has been having a lot more anxiety and feels like she is having panic attacks. No dyspnea. She does report family history of heart disease which her worried.   ---------------------------------------------------------------------------------------------------      Medications: Outpatient Medications Prior to Visit  Medication Sig  . metoprolol succinate (TOPROL-XL) 25 MG 24 hr tablet Take 1 tablet (25 mg total) by mouth daily.   No facility-administered medications prior to visit.    Last CBC Lab Results  Component Value Date   WBC 7.8 11/27/2020   HGB 14.6 11/27/2020   HCT 44.1 11/27/2020   MCV 85 11/27/2020   MCH 28.0 11/27/2020   RDW 13.6 11/27/2020   PLT 305 11/27/2020   Last metabolic panel Lab Results  Component Value Date   GLUCOSE 95 11/27/2020   NA 138 11/27/2020   K 4.1 11/27/2020   CL 101 11/27/2020   CO2 21 11/27/2020   BUN 14 11/27/2020   CREATININE 0.92 11/27/2020   GFRNONAA >60 07/21/2015   GFRAA >60 07/21/2015   CALCIUM 9.3 11/27/2020   PROT 7.0 11/27/2020   ALBUMIN 4.4 11/27/2020   LABGLOB 2.6 11/27/2020   AGRATIO 1.7 11/27/2020   BILITOT 0.4 11/27/2020   ALKPHOS 130 (H) 11/27/2020   AST 22 11/27/2020   ALT 26 11/27/2020   ANIONGAP 8 07/21/2015   Last thyroid functions Lab Results  Component Value Date   TSH 2.010 11/27/2020       Objective    BP (!) 168/108   Pulse 88   Ht 5\' 5"  (1.651 m)   Wt 188 lb 12.8 oz (85.6 kg)   SpO2 98%   BMI 31.42 kg/m     Physical Exam  General: Appearance:    Obese female in no acute distress  Eyes:    PERRL, conjunctiva/corneas clear, EOM's intact       Lungs:     Clear to auscultation bilaterally, respirations unlabored  Heart:    Normal heart rate. Normal rhythm. No murmurs, rubs, or gallops.   MS:   All extremities are intact.   Neurologic:   Awake, alert, oriented x 3. No apparent focal neurological           defect.         Assessment & Plan     1. Chest pain, unspecified type Her father had extensive vascular disease including MI and died in his late 9s. She had normal EKG at recent visit.  - DG Chest 2 View; Future - Ambulatory referral  to Cardiology  Consider chest CT if xray is non-diagnostic.   2. Psoriasis She reports several lesions on extremities that usually flare during the allergy seasons.  - triamcinolone ointment (KENALOG) 0.5 %; Apply 1 application topically 2 (two) times daily.  Dispense: 30 g; Refill: 0  3. Primary hypertension Somewhat improved with metoprolol. Will add- amLODipine (NORVASC) 5 MG tablet; Take 1 tablet (5 mg total) by mouth daily.  Dispense: 30 tablet; Refill: 3    she is also having menopausal symptoms and anxiety attacks, will consider addition of SNRI once her blood pressure is better controlled. Clonidine may also be an option.      The entirety of the information documented in the History of Present Illness, Review of Systems and Physical Exam were personally obtained by me. Portions of this information were initially documented by the CMA and reviewed by me for thoroughness and accuracy.      Mila Merry, MD  [facility] [phone] (phone) [phone] (fax)  [facility]

## 2020-12-11 NOTE — Patient Instructions (Addendum)
Go to the [facility] on [address] for chest Xray  

## 2020-12-15 ENCOUNTER — Other Ambulatory Visit: Payer: Self-pay | Admitting: Family Medicine

## 2020-12-15 DIAGNOSIS — R079 Chest pain, unspecified: Secondary | ICD-10-CM

## 2020-12-15 DIAGNOSIS — S86112A Strain of other muscle(s) and tendon(s) of posterior muscle group at lower leg level, left leg, initial encounter: Secondary | ICD-10-CM | POA: Diagnosis not present

## 2020-12-16 DIAGNOSIS — S86112A Strain of other muscle(s) and tendon(s) of posterior muscle group at lower leg level, left leg, initial encounter: Secondary | ICD-10-CM | POA: Diagnosis not present

## 2020-12-24 ENCOUNTER — Other Ambulatory Visit: Payer: Self-pay

## 2020-12-24 ENCOUNTER — Ambulatory Visit
Admission: RE | Admit: 2020-12-24 | Discharge: 2020-12-24 | Disposition: A | Discharge status: Home / Self Care | Payer: BC Managed Care – PPO | Source: Ambulatory Visit | Attending: Family Medicine | Admitting: Family Medicine

## 2020-12-24 DIAGNOSIS — R079 Chest pain, unspecified: Secondary | ICD-10-CM | POA: Diagnosis not present

## 2020-12-24 DIAGNOSIS — R911 Solitary pulmonary nodule: Secondary | ICD-10-CM | POA: Diagnosis not present

## 2020-12-24 MED ORDER — IOHEXOL 300 MG/ML  SOLN
75.0000 mL | Freq: Once | INTRAMUSCULAR | Status: AC | PRN
  Administered 2020-12-24: 75 mL via INTRAVENOUS

## 2020-12-28 ENCOUNTER — Encounter: Payer: Self-pay | Admitting: Family Medicine

## 2020-12-28 DIAGNOSIS — K760 Fatty (change of) liver, not elsewhere classified: Secondary | ICD-10-CM | POA: Insufficient documentation

## 2021-01-04 ENCOUNTER — Ambulatory Visit: Payer: BC Managed Care – PPO | Admitting: Cardiology

## 2021-01-05 ENCOUNTER — Encounter: Payer: Self-pay | Admitting: Cardiology

## 2021-01-05 DIAGNOSIS — S86112A Strain of other muscle(s) and tendon(s) of posterior muscle group at lower leg level, left leg, initial encounter: Secondary | ICD-10-CM | POA: Diagnosis not present

## 2021-01-26 DIAGNOSIS — S86112D Strain of other muscle(s) and tendon(s) of posterior muscle group at lower leg level, left leg, subsequent encounter: Secondary | ICD-10-CM | POA: Diagnosis not present

## 2021-01-27 DIAGNOSIS — S86112D Strain of other muscle(s) and tendon(s) of posterior muscle group at lower leg level, left leg, subsequent encounter: Secondary | ICD-10-CM | POA: Diagnosis not present

## 2021-02-02 DIAGNOSIS — S86112A Strain of other muscle(s) and tendon(s) of posterior muscle group at lower leg level, left leg, initial encounter: Secondary | ICD-10-CM | POA: Diagnosis not present

## 2021-02-11 DIAGNOSIS — S86112A Strain of other muscle(s) and tendon(s) of posterior muscle group at lower leg level, left leg, initial encounter: Secondary | ICD-10-CM | POA: Diagnosis not present

## 2021-02-17 DIAGNOSIS — S86112A Strain of other muscle(s) and tendon(s) of posterior muscle group at lower leg level, left leg, initial encounter: Secondary | ICD-10-CM | POA: Diagnosis not present

## 2021-02-23 DIAGNOSIS — S86112D Strain of other muscle(s) and tendon(s) of posterior muscle group at lower leg level, left leg, subsequent encounter: Secondary | ICD-10-CM | POA: Diagnosis not present

## 2021-02-25 DIAGNOSIS — S86112A Strain of other muscle(s) and tendon(s) of posterior muscle group at lower leg level, left leg, initial encounter: Secondary | ICD-10-CM | POA: Diagnosis not present

## 2021-07-07 ENCOUNTER — Other Ambulatory Visit: Payer: Self-pay | Admitting: Orthopedic Surgery

## 2021-07-07 DIAGNOSIS — M67911 Unspecified disorder of synovium and tendon, right shoulder: Secondary | ICD-10-CM

## 2021-07-07 DIAGNOSIS — M7531 Calcific tendinitis of right shoulder: Secondary | ICD-10-CM | POA: Diagnosis not present

## 2021-07-07 DIAGNOSIS — M542 Cervicalgia: Secondary | ICD-10-CM | POA: Diagnosis not present

## 2021-07-21 ENCOUNTER — Ambulatory Visit
Admission: EM | Admit: 2021-07-21 | Discharge: 2021-07-21 | Disposition: A | Discharge status: Home / Self Care | Payer: BC Managed Care – PPO | Attending: Emergency Medicine | Admitting: Emergency Medicine

## 2021-07-21 ENCOUNTER — Encounter: Payer: Self-pay | Admitting: Emergency Medicine

## 2021-07-21 ENCOUNTER — Other Ambulatory Visit: Payer: Self-pay

## 2021-07-21 DIAGNOSIS — B349 Viral infection, unspecified: Secondary | ICD-10-CM

## 2021-07-21 DIAGNOSIS — I10 Essential (primary) hypertension: Secondary | ICD-10-CM | POA: Diagnosis not present

## 2021-07-21 LAB — POCT INFLUENZA A/B
Influenza A, POC: NEGATIVE
Influenza B, POC: NEGATIVE

## 2021-07-21 LAB — POCT RAPID STREP A (OFFICE): Rapid Strep A Screen: NEGATIVE

## 2021-07-21 NOTE — ED Triage Notes (Addendum)
Pt c/o ST and cough x 2 weeks. She developed a fever yesterday. At home Covid test was negative.

## 2021-07-21 NOTE — Discharge Instructions (Addendum)
Your strep test is negative.  Your flu test is negative.    Your COVID test is pending.  You should self quarantine until the test result is back.    Take Tylenol or ibuprofen as needed for fever or discomfort.  Rest and keep yourself hydrated.    Follow-up with your primary care provider if your symptoms are not improving.    Your blood pressure is elevated today at 170/100.  Please have this rechecked by your primary care provider in 1-2 weeks.

## 2021-07-21 NOTE — ED Provider Notes (Signed)
Kelly Stephenson    CSN: [id number] Arrival date & time: 07/21/21  1158      History   Chief Complaint Chief Complaint  Patient presents with   Fever   Cough   Sore Throat    HPI Kelly Stephenson is a 50 y.o. female.  Patient presents with fever, sore throat, congestion, cough since yesterday.  1 episode of vomiting yesterday.  Treatment at home with OTC cold and flu medication.  She had similar symptoms 2 weeks ago but those improved except for some residual cough.  She denies rash, shortness of breath, diarrhea, or other symptoms.  Patient requests strep and flu testing.  Her medical history includes hypertension.  The history is provided by the patient and medical records.   Past Medical History:  Diagnosis Date   Hypertension     Patient Active Problem List   Diagnosis Date Noted   Hepatic steatosis 12/28/2020   Primary hypertension 12/11/2020   Psoriasis 12/11/2020   COVID-19 10/08/2020    Past Surgical History:  Procedure Laterality Date   ABDOMINAL HYSTERECTOMY     CESAREAN SECTION      OB History   No obstetric history on file.      Home Medications    Prior to Admission medications   Medication Sig Start Date End Date Taking? Authorizing Provider  amLODipine (NORVASC) 5 MG tablet Take 1 tablet (5 mg total) by mouth daily. 12/11/20   Malva Limes, MD  metoprolol succinate (TOPROL-XL) 25 MG 24 hr tablet Take 1 tablet (25 mg total) by mouth daily. 11/27/20   Malva Limes, MD  triamcinolone ointment (KENALOG) 0.5 % Apply 1 application topically 2 (two) times daily. 12/11/20   Malva Limes, MD    Family History Family History  Problem Relation Age of Onset   Osteoporosis Mother    High blood pressure Father    Heart Problems Father    Diabetes Father    Hypertension Father    Stroke Father    Heart attack Father    Depression Sister    Diabetes Sister    Hypertension Sister    Hypertension Maternal Aunt    Obesity Maternal Aunt     Hypertension Maternal Uncle    Kidney disease Maternal Uncle    Glaucoma Maternal Uncle    Stroke Paternal Uncle    Stroke Maternal Grandfather    Stroke Other     Social History Social History   Tobacco Use   Smoking status: Never   Smokeless tobacco: Never  Vaping Use   Vaping Use: Never used  Substance Use Topics   Alcohol use: Yes    Alcohol/week: 1.0 standard drink    Types: 1 Glasses of wine per week   Drug use: No     Allergies   Patient has no known allergies.   Review of Systems Review of Systems  Constitutional:  Positive for fever. Negative for chills.  HENT:  Positive for congestion and sore throat. Negative for ear pain.   Respiratory:  Positive for cough. Negative for shortness of breath.   Cardiovascular:  Negative for chest pain and palpitations.  Gastrointestinal:  Positive for vomiting. Negative for abdominal pain and diarrhea.  Skin:  Negative for color change and rash.  All other systems reviewed and are negative.   Physical Exam Triage Vital Signs ED Triage Vitals [07/21/21 1235]  Enc Vitals Group     BP      Pulse Rate 92  Resp      Temp 98.8 F (37.1 C)     Temp Source Oral     SpO2 97 %     Weight      Height      Head Circumference      Peak Flow      Pain Score      Pain Loc      Pain Edu?      Excl. in GC?    No data found.  Updated Vital Signs BP (!) 170/100 (BP Location: Left Arm)   Pulse 92   Temp 98.8 F (37.1 C) (Oral)   SpO2 97%   Visual Acuity Right Eye Distance:   Left Eye Distance:   Bilateral Distance:    Right Eye Near:   Left Eye Near:    Bilateral Near:     Physical Exam Vitals and nursing note reviewed.  Constitutional:      General: She is not in acute distress.    Appearance: She is well-developed.  HENT:     Head: Normocephalic and atraumatic.     Right Ear: Tympanic membrane normal.     Left Ear: Tympanic membrane normal.     Nose: Nose normal.     Mouth/Throat:     Mouth: Mucous  membranes are moist.     Pharynx: Posterior oropharyngeal erythema present.  Eyes:     Conjunctiva/sclera: Conjunctivae normal.  Cardiovascular:     Rate and Rhythm: Normal rate and regular rhythm.     Heart sounds: Normal heart sounds.  Pulmonary:     Effort: Pulmonary effort is normal. No respiratory distress.     Breath sounds: Normal breath sounds.  Abdominal:     Palpations: Abdomen is soft.     Tenderness: There is no abdominal tenderness.  Musculoskeletal:     Cervical back: Neck supple.  Skin:    General: Skin is warm and dry.  Neurological:     Mental Status: She is alert.  Psychiatric:        Mood and Affect: Mood normal.        Behavior: Behavior normal.     UC Treatments / Results  Labs (all labs ordered are listed, but only abnormal results are displayed) Labs Reviewed  NOVEL CORONAVIRUS, NAA  POCT RAPID STREP A (OFFICE)  POCT INFLUENZA A/B    EKG   Radiology No results found.  Procedures Procedures (including critical care time)  Medications Ordered in UC Medications - No data to display  Initial Impression / Assessment and Plan / UC Course  I have reviewed the triage vital signs and the nursing notes.  Pertinent labs & imaging results that were available during my care of the patient were reviewed by me and considered in my medical decision making (see chart for details).  Viral illness.  Elevated blood pressure reading with hypertension.  Rapid strep negative.  Rapid flu negative.  COVID pending.  Instructed patient to self quarantine per CDC guidelines.  Discussed symptomatic treatment including Tylenol or ibuprofen, rest, hydration.  Instructed patient to follow up with PCP if symptoms are not improving.  Discussed with patient that her blood pressure is elevated today and needs to be rechecked by her PCP in 1 to 2 weeks.  Education provided on managing hypertension and instructed patient to avoid OTC medications that can elevate her blood  pressure.  Suggested Coricidin HBP for her current symptoms.  Patient agrees to plan of care.    Final Clinical  Impressions(s) / UC Diagnoses   Final diagnoses:  Viral illness  Elevated blood pressure reading in office with diagnosis of hypertension     Discharge Instructions      Your strep test is negative.  Your flu test is negative.    Your COVID test is pending.  You should self quarantine until the test result is back.    Take Tylenol or ibuprofen as needed for fever or discomfort.  Rest and keep yourself hydrated.    Follow-up with your primary care provider if your symptoms are not improving.    Your blood pressure is elevated today at 170/100.  Please have this rechecked by your primary care provider in 1-2 weeks.          ED Prescriptions   None    PDMP not reviewed this encounter.   Mickie Bail, NP 07/21/21 1347

## 2021-07-22 ENCOUNTER — Other Ambulatory Visit: Payer: BC Managed Care – PPO

## 2021-07-22 LAB — NOVEL CORONAVIRUS, NAA: SARS-CoV-2, NAA: NOT DETECTED

## 2021-07-22 LAB — SARS-COV-2, NAA 2 DAY TAT

## 2021-07-23 ENCOUNTER — Encounter: Payer: Self-pay | Admitting: Emergency Medicine

## 2021-07-23 ENCOUNTER — Other Ambulatory Visit: Payer: Self-pay

## 2021-07-23 ENCOUNTER — Emergency Department: Payer: BC Managed Care – PPO

## 2021-07-23 ENCOUNTER — Emergency Department
Admission: EM | Admit: 2021-07-23 | Discharge: 2021-07-23 | Disposition: A | Discharge status: Home / Self Care | Payer: BC Managed Care – PPO | Attending: Emergency Medicine | Admitting: Emergency Medicine

## 2021-07-23 ENCOUNTER — Ambulatory Visit: Payer: Self-pay | Admitting: *Deleted

## 2021-07-23 DIAGNOSIS — J0100 Acute maxillary sinusitis, unspecified: Secondary | ICD-10-CM | POA: Insufficient documentation

## 2021-07-23 DIAGNOSIS — I10 Essential (primary) hypertension: Secondary | ICD-10-CM | POA: Insufficient documentation

## 2021-07-23 DIAGNOSIS — R059 Cough, unspecified: Secondary | ICD-10-CM | POA: Diagnosis not present

## 2021-07-23 DIAGNOSIS — J40 Bronchitis, not specified as acute or chronic: Secondary | ICD-10-CM | POA: Diagnosis not present

## 2021-07-23 DIAGNOSIS — Z20822 Contact with and (suspected) exposure to covid-19: Secondary | ICD-10-CM | POA: Diagnosis not present

## 2021-07-23 DIAGNOSIS — R079 Chest pain, unspecified: Secondary | ICD-10-CM | POA: Diagnosis not present

## 2021-07-23 DIAGNOSIS — Z8616 Personal history of COVID-19: Secondary | ICD-10-CM | POA: Insufficient documentation

## 2021-07-23 DIAGNOSIS — Z79899 Other long term (current) drug therapy: Secondary | ICD-10-CM | POA: Diagnosis not present

## 2021-07-23 LAB — URINALYSIS, COMPLETE (UACMP) WITH MICROSCOPIC
Bilirubin Urine: NEGATIVE
Glucose, UA: NEGATIVE mg/dL
Hgb urine dipstick: NEGATIVE
Ketones, ur: NEGATIVE mg/dL
Leukocytes,Ua: NEGATIVE
Nitrite: NEGATIVE
Protein, ur: NEGATIVE mg/dL
Specific Gravity, Urine: 1.005 (ref 1.005–1.030)
pH: 7 (ref 5.0–8.0)

## 2021-07-23 LAB — BASIC METABOLIC PANEL
Anion gap: 7 (ref 5–15)
BUN: 12 mg/dL (ref 6–20)
CO2: 25 mmol/L (ref 22–32)
Calcium: 9.2 mg/dL (ref 8.9–10.3)
Chloride: 105 mmol/L (ref 98–111)
Creatinine, Ser: 0.69 mg/dL (ref 0.44–1.00)
GFR, Estimated: >60 mL/min (ref >60)
Glucose, Bld: 103 mg/dL — ABNORMAL HIGH (ref 70–99)
Potassium: 3.9 mmol/L (ref 3.5–5.1)
Sodium: 137 mmol/L (ref 135–145)

## 2021-07-23 LAB — RESP PANEL BY RT-PCR (FLU A&B, COVID) ARPGX2
Influenza A by PCR: NEGATIVE
Influenza B by PCR: NEGATIVE
SARS Coronavirus 2 by RT PCR: NEGATIVE

## 2021-07-23 LAB — CBC
HCT: 42.1 % (ref 36.0–46.0)
Hemoglobin: 14.4 g/dL (ref 12.0–15.0)
MCH: 28.1 pg (ref 26.0–34.0)
MCHC: 34.2 g/dL (ref 30.0–36.0)
MCV: 82.2 fL (ref 80.0–100.0)
Platelets: 321 10*3/uL (ref 150–400)
RBC: 5.12 MIL/uL — ABNORMAL HIGH (ref 3.87–5.11)
RDW: 12.7 % (ref 11.5–15.5)
WBC: 10.2 10*3/uL (ref 4.0–10.5)
nRBC: 0 % (ref 0.0–0.2)

## 2021-07-23 LAB — TROPONIN I (HIGH SENSITIVITY)
Troponin I (High Sensitivity): 5 ng/L (ref <18)
Troponin I (High Sensitivity): 4 ng/L (ref <18)

## 2021-07-23 MED ORDER — HYDROCOD POLST-CPM POLST ER 10-8 MG/5ML PO SUER: 5.0000 mL | Freq: Two times a day (BID) | ORAL | 0 refills | Status: DC | PRN

## 2021-07-23 MED ORDER — PREDNISONE 20 MG PO TABS: 40.0000 mg | ORAL_TABLET | Freq: Every day | ORAL | 0 refills | Status: AC

## 2021-07-23 MED ORDER — AMOXICILLIN-POT CLAVULANATE 875-125 MG PO TABS
1.0000 | ORAL_TABLET | Freq: Once | ORAL | Status: AC
  Administered 2021-07-23: 1 via ORAL
  Filled 2021-07-23: qty 1

## 2021-07-23 MED ORDER — AZITHROMYCIN 250 MG PO TABS: ORAL_TABLET | ORAL | 0 refills | Status: DC

## 2021-07-23 MED ORDER — AMOXICILLIN-POT CLAVULANATE 875-125 MG PO TABS: 1.0000 | ORAL_TABLET | Freq: Two times a day (BID) | ORAL | 0 refills | Status: AC

## 2021-07-23 MED ORDER — PREDNISONE 20 MG PO TABS
60.0000 mg | ORAL_TABLET | Freq: Once | ORAL | Status: AC
  Administered 2021-07-23: 60 mg via ORAL
  Filled 2021-07-23: qty 3

## 2021-07-23 MED ORDER — ONDANSETRON 4 MG PO TBDP
4.0000 mg | ORAL_TABLET | Freq: Once | ORAL | Status: AC
  Administered 2021-07-23: 4 mg via ORAL
  Filled 2021-07-23: qty 1

## 2021-07-23 NOTE — Telephone Encounter (Signed)
Reason for Disposition  Chest pain  (Exception: MILD central chest pain, present only when coughing)  Answer Assessment - Initial Assessment Questions 1. ONSET: "When did the cough begin?"      Pt calling in c/o coughing up thick green mucus.   Started for 2 weeks ago today.   2. SEVERITY: "How bad is the cough today?"      Bad   It keeps me up at night 3. SPUTUM: "Describe the color of your sputum" (none, dry cough; clear, white, yellow, green)     Green mucus.   I went to walk in 2 days ago.   No fever then.   Test negative for flu and strep.   Covid test not back yet.   They said to treat symptoms.   I'm getting worse I'm can't catch my breath, and vomiting too. 4. HEMOPTYSIS: "Are you coughing up any blood?" If so ask: "How much?" (flecks, streaks, tablespoons, etc.)     I have blood in mucus day before yesterday when I coughed up a lot of mucus.   No blood since then 5. DIFFICULTY BREATHING: "Are you having difficulty breathing?" If Yes, ask: "How bad is it?" (e.g., mild, moderate, severe)    - MILD: No SOB at rest, mild SOB with walking, speaks normally in sentences, can lie down, no retractions, pulse < 100.    - MODERATE: SOB at rest, SOB with minimal exertion and prefers to sit, cannot lie down flat, speaks in phrases, mild retractions, audible wheezing, pulse 100-120.    - SEVERE: Very SOB at rest, speaks in single words, struggling to breathe, sitting hunched forward, retractions, pulse > 120      I'm short of breath.   I'm sitting up in a recliner.   When I lay down I hear a crackle.    I can't lay down.   6. FEVER: "Do you have a fever?" If Yes, ask: "What is your temperature, how was it measured, and when did it start?"     I don't have fever.  I've checked it twice today. My urine smells like ammonia.      I have hypertension 7. CARDIAC HISTORY: "Do you have any history of heart disease?" (e.g., heart attack, congestive heart failure)      I have hypertension   No heart  problems 8. LUNG HISTORY: "Do you have any history of lung disease?"  (e.g., pulmonary embolus, asthma, emphysema)     No  lung problems 9. PE RISK FACTORS: "Do you have a history of blood clots?" (or: recent major surgery, recent prolonged travel, bedridden)     No 10. OTHER SYMPTOMS: "Do you have any other symptoms?" (e.g., runny nose, wheezing, chest pain)       Difficulty breathing, coughing a lot, runny nose and sore throat and my chest hurts a lot.   The chest pain is tight and really hurts when I cough.   Urine smells like ammonia, frequency, no burning, no lower abd pain.   I vomited when choking and getting choked when I vomit.     11. PREGNANCY: "Is there any chance you are pregnant?" "When was your last menstrual period?"       No 12. TRAVEL: "Have you traveled out of the country in the last month?" (e.g., travel history, exposures)       No exposures or travels.  Protocols used: Cough - Acute Productive-A-AH

## 2021-07-23 NOTE — ED Notes (Signed)
E signature pad did not work. Pt gave verbal consent for discharge. Pt ambulatory to lobby, husband waiting for pt.

## 2021-07-23 NOTE — ED Provider Notes (Signed)
[facility] Emergency Department Provider Note  ____________________________________________   Event Date/Time   First MD Initiated Contact with Patient 07/23/21 1506     (approximate)  I have reviewed the triage vital signs and the nursing notes.   HISTORY  Chief Complaint Cough, Chest Pain, and Shortness of Breath    HPI Kelly Stephenson is a 50 y.o. female  with h/o HTN hepatic steatosis, here with cough, congestion. Pt sx started approx 1.5 weeks ago with cough, nasal congestion, sore throat. Since then, pt has had slight improvement in her sore throat but congestion, cough,a nd significant sputum production has persisted.  She's had intermittent SOB. No nausea, vomiting. Has been taking mucinex with minimal relief.  No specific aggravating factors.  Her sore throat is worse in the mornings.  Chest congestion also appears worse in the morning.  She has history of COVID-19, earlier this year.       Past Medical History:  Diagnosis Date   Hypertension     Patient Active Problem List   Diagnosis Date Noted   Hepatic steatosis 12/28/2020   Primary hypertension 12/11/2020   Psoriasis 12/11/2020   COVID-19 10/08/2020    Past Surgical History:  Procedure Laterality Date   ABDOMINAL HYSTERECTOMY     CESAREAN SECTION      Prior to Admission medications   Medication Sig Start Date End Date Taking? Authorizing Provider  amoxicillin-clavulanate (AUGMENTIN) 875-125 MG tablet Take 1 tablet by mouth 2 (two) times daily for 7 days. 07/23/21 07/30/21 Yes Shaune Pollack, MD  azithromycin (ZITHROMAX Z-PAK) 250 MG tablet Take 2 tablets (500 mg) on  Day 1,  followed by 1 tablet (250 mg) once daily on Days 2 through 5. 07/23/21  Yes Shaune Pollack, MD  chlorpheniramine-HYDROcodone [facility] PENNKINETIC ER) 10-8 MG/5ML SUER Take 5 mLs by mouth every 12 (twelve) hours as needed for cough. 07/23/21  Yes Shaune Pollack, MD  predniSONE (DELTASONE) 20 MG tablet  Take 2 tablets (40 mg total) by mouth daily for 5 days. 07/23/21 07/28/21 Yes Shaune Pollack, MD  amLODipine (NORVASC) 5 MG tablet Take 1 tablet (5 mg total) by mouth daily. 12/11/20   Malva Limes, MD  metoprolol succinate (TOPROL-XL) 25 MG 24 hr tablet Take 1 tablet (25 mg total) by mouth daily. 11/27/20   Malva Limes, MD  triamcinolone ointment (KENALOG) 0.5 % Apply 1 application topically 2 (two) times daily. 12/11/20   Malva Limes, MD    Allergies Patient has no known allergies.  Family History  Problem Relation Age of Onset   Osteoporosis Mother    High blood pressure Father    Heart Problems Father    Diabetes Father    Hypertension Father    Stroke Father    Heart attack Father    Depression Sister    Diabetes Sister    Hypertension Sister    Hypertension Maternal Aunt    Obesity Maternal Aunt    Hypertension Maternal Uncle    Kidney disease Maternal Uncle    Glaucoma Maternal Uncle    Stroke Paternal Uncle    Stroke Maternal Grandfather    Stroke Other     Social History Social History   Tobacco Use   Smoking status: Never   Smokeless tobacco: Never  Vaping Use   Vaping Use: Never used  Substance Use Topics   Alcohol use: Yes    Alcohol/week: 1.0 standard drink    Types: 1 Glasses of wine per week  Drug use: No    Review of Systems  Review of Systems  Constitutional:  Positive for fatigue. Negative for fever.  HENT:  Positive for congestion, postnasal drip, rhinorrhea and sore throat.   Eyes:  Negative for visual disturbance.  Respiratory:  Positive for cough. Negative for shortness of breath.   Cardiovascular:  Negative for chest pain.  Gastrointestinal:  Negative for abdominal pain, diarrhea, nausea and vomiting.  Genitourinary:  Negative for flank pain.  Musculoskeletal:  Negative for back pain and neck pain.  Skin:  Negative for rash and wound.  Neurological:  Negative for weakness.  All other systems reviewed and are negative.    ____________________________________________  PHYSICAL EXAM:      VITAL SIGNS: ED Triage Vitals  Enc Vitals Group     BP 07/23/21 1132 (!) 186/118     Pulse Rate 07/23/21 1132 (!) 105     Resp 07/23/21 1132 18     Temp 07/23/21 1132 98 F (36.7 C)     Temp Source 07/23/21 1132 Oral     SpO2 07/23/21 1132 96 %     Weight 07/23/21 1133 195 lb (88.5 kg)     Height 07/23/21 1133 5\' 4"  (1.626 m)     Head Circumference --      Peak Flow --      Pain Score 07/23/21 1132 7     Pain Loc --      Pain Edu? --      Excl. in [place]? --      Physical Exam Vitals and nursing note reviewed.  Constitutional:      General: She is not in acute distress.    Appearance: She is well-developed.  HENT:     Head: Normocephalic and atraumatic.     Comments: Moderate nasal congestion.  Posterior pharyngeal erythema without tonsillar swelling or exudates.  Uvula is midline. Eyes:     Conjunctiva/sclera: Conjunctivae normal.  Cardiovascular:     Rate and Rhythm: Normal rate and regular rhythm.     Heart sounds: Normal heart sounds. No murmur heard.   No friction rub.  Pulmonary:     Effort: Pulmonary effort is normal. No respiratory distress.     Breath sounds: Examination of the left-lower field reveals rales. Rales present. No wheezing.  Abdominal:     General: There is no distension.     Palpations: Abdomen is soft.     Tenderness: There is no abdominal tenderness.  Musculoskeletal:     Cervical back: Neck supple.  Skin:    General: Skin is warm.     Capillary Refill: Capillary refill takes less than 2 seconds.  Neurological:     Mental Status: She is alert and oriented to person, place, and time.     Motor: No abnormal muscle tone.      ____________________________________________   LABS (all labs ordered are listed, but only abnormal results are displayed)  Labs Reviewed  BASIC METABOLIC PANEL - Abnormal; Notable for the following components:      Result Value   Glucose, Bld  103 (*)    All other components within normal limits  CBC - Abnormal; Notable for the following components:   RBC 5.12 (*)    All other components within normal limits  URINALYSIS, COMPLETE (UACMP) WITH MICROSCOPIC - Abnormal; Notable for the following components:   Color, Urine STRAW (*)    APPearance CLEAR (*)    Bacteria, UA RARE (*)    All other components within  normal limits  RESP PANEL BY RT-PCR (FLU A&B, COVID) ARPGX2  TROPONIN I (HIGH SENSITIVITY)  TROPONIN I (HIGH SENSITIVITY)    ____________________________________________  EKG: Normal sinus rhythm 108, PR 122, QRS 74, QTc 460. No acute ST elevations or depressions. No ischemia or infarct. ________________________________________  RADIOLOGY All imaging, including plain films, CT scans, and ultrasounds, independently reviewed by me, and interpretations confirmed via formal radiology reads.  ED MD interpretation:   CXR: Clear  Official radiology report(s): DG Chest 2 View  Result Date: 07/23/2021 CLINICAL DATA:  Chest pain EXAM: CHEST - 2 VIEW COMPARISON:  The prior chest x-ray 12/11/2020 FINDINGS: The lungs are clear and negative for focal airspace consolidation, pulmonary edema or suspicious pulmonary nodule. No pleural effusion or pneumothorax. Cardiac and mediastinal contours are within normal limits. No acute fracture or lytic or blastic osseous lesions. The visualized upper abdominal bowel gas pattern is unremarkable. IMPRESSION: Negative chest x-ray. Electronically Signed   By: Malachy Moan M.D.   On: 07/23/2021 12:08    ____________________________________________  PROCEDURES   Procedure(s) performed (including Critical Care):  Procedures  ____________________________________________  INITIAL IMPRESSION / MDM / ASSESSMENT AND PLAN / ED COURSE  As part of my medical decision making, I reviewed the following data within the electronic MEDICAL RECORD NUMBER Nursing notes reviewed and incorporated, Old chart  reviewed, Notes from prior ED visits, and [place] Controlled Substance Database       *ETHELENE CLOSSER was evaluated in Emergency Department on 07/23/2021 for the symptoms described in the history of present illness. She was evaluated in the context of the global COVID-19 pandemic, which necessitated consideration that the patient might be at risk for infection with the SARS-CoV-2 virus that causes COVID-19. Institutional protocols and algorithms that pertain to the evaluation of patients at risk for COVID-19 are in a state of rapid change based on information released by regulatory bodies including the CDC and federal and state organizations. These policies and algorithms were followed during the patient's care in the ED.  Some ED evaluations and interventions may be delayed as a result of limited staffing during the pandemic.*     Medical Decision Making: 50 year old female with past medical history as above including hypertension here with cough, nasal congestion, shortness of breath, sputum production.  Likely has ongoing viral URI which I suspect is possibly developed into a bacterial sinusitis with possible bronchitis given the amount of sputum production and drainage.  The time course is also consistent with this.  She is afebrile and overall nontoxic-appearing.  Chest x-ray is clear.  EKG nonischemic.  Troponin negative despite symptoms for several days, do not suspect ACS.  CBC and BMP are unremarkable.  She has no evidence of congestive heart failure.  COVID-negative.  Flu negative.  Given ongoing infectious symptoms with sputum production and drainage, will treat empirically for possible early commune acquired pneumonia not evident on chest x-ray.  Will give a brief course of prednisone for her possible sinus inflammation, questionable history of reactive airways.  She does not appear to be wheezing currently and will hold on albuterol to avoid additional hypertension or  tachycardia.  ____________________________________________  FINAL CLINICAL IMPRESSION(S) / ED DIAGNOSES  Final diagnoses:  Bronchitis  Acute non-recurrent maxillary sinusitis     MEDICATIONS GIVEN DURING THIS VISIT:  Medications  predniSONE (DELTASONE) tablet 60 mg (60 mg Oral Given 07/23/21 1702)  amoxicillin-clavulanate (AUGMENTIN) 875-125 MG per tablet 1 tablet (1 tablet Oral Given 07/23/21 1702)  ondansetron (ZOFRAN-ODT) disintegrating tablet 4  mg (4 mg Oral Given 07/23/21 1702)     ED Discharge Orders          Ordered    predniSONE (DELTASONE) 20 MG tablet  Daily        07/23/21 1747    amoxicillin-clavulanate (AUGMENTIN) 875-125 MG tablet  2 times daily        07/23/21 1747    azithromycin (ZITHROMAX Z-PAK) 250 MG tablet        07/23/21 1747    chlorpheniramine-HYDROcodone (TUSSIONEX PENNKINETIC ER) 10-8 MG/5ML SUER  Every 12 hours PRN        07/23/21 1748             Note:  This document was prepared using Dragon voice recognition software and may include unintentional dictation errors.   Shaune Pollack, MD 07/23/21 [phone]

## 2021-07-23 NOTE — ED Triage Notes (Signed)
Pt here with c/o chest tightness, productive cough and shob for over a week now, no respiratory distress noted in triage. States she [facility] and hasn't gotten the results, was negative for flu and strep at that time. Also c/o urine smelling like ammonia. Denies vomiting, diarrhea. NAD.

## 2021-07-23 NOTE — ED Notes (Signed)
Provided pt with graham crackers to take with antibiotics and medication. Pt reported had not eaten today.

## 2021-07-23 NOTE — Telephone Encounter (Signed)
Pt called in c/o difficulty breathing, tightness in her chest, coughing a lot with green mucus, sore throat, runny nose and stopped up nose, urine that smells like ammonia.   Been sick for 2 weeks now.    See triage notes.  No appts at [facility] so I have referred her to the ED due to the difficulty breathing and tightness in her chest.   Pt agreeable to going and will go to [facility] ED in [place].   Not comfortable with the care at [facility].  I sent my notes to [facility] for Dr. Sherrie Mustache.

## 2021-08-04 ENCOUNTER — Ambulatory Visit
Admission: RE | Admit: 2021-08-04 | Discharge: 2021-08-04 | Disposition: A | Discharge status: Home / Self Care | Payer: BC Managed Care – PPO | Source: Ambulatory Visit | Attending: Orthopedic Surgery | Admitting: Orthopedic Surgery

## 2021-08-04 DIAGNOSIS — M67911 Unspecified disorder of synovium and tendon, right shoulder: Secondary | ICD-10-CM

## 2021-08-04 DIAGNOSIS — M25511 Pain in right shoulder: Secondary | ICD-10-CM | POA: Diagnosis not present

## 2021-08-13 DIAGNOSIS — M7531 Calcific tendinitis of right shoulder: Secondary | ICD-10-CM | POA: Diagnosis not present

## 2021-11-24 DIAGNOSIS — M9902 Segmental and somatic dysfunction of thoracic region: Secondary | ICD-10-CM | POA: Diagnosis not present

## 2021-11-24 DIAGNOSIS — M9901 Segmental and somatic dysfunction of cervical region: Secondary | ICD-10-CM | POA: Diagnosis not present

## 2021-11-24 DIAGNOSIS — M50122 Cervical disc disorder at C5-C6 level with radiculopathy: Secondary | ICD-10-CM | POA: Diagnosis not present

## 2021-11-24 DIAGNOSIS — M546 Pain in thoracic spine: Secondary | ICD-10-CM | POA: Diagnosis not present

## 2021-11-25 DIAGNOSIS — M50122 Cervical disc disorder at C5-C6 level with radiculopathy: Secondary | ICD-10-CM | POA: Diagnosis not present

## 2021-11-25 DIAGNOSIS — M546 Pain in thoracic spine: Secondary | ICD-10-CM | POA: Diagnosis not present

## 2021-11-25 DIAGNOSIS — M9902 Segmental and somatic dysfunction of thoracic region: Secondary | ICD-10-CM | POA: Diagnosis not present

## 2021-11-25 DIAGNOSIS — M9901 Segmental and somatic dysfunction of cervical region: Secondary | ICD-10-CM | POA: Diagnosis not present

## 2021-11-26 DIAGNOSIS — M50122 Cervical disc disorder at C5-C6 level with radiculopathy: Secondary | ICD-10-CM | POA: Diagnosis not present

## 2021-11-26 DIAGNOSIS — M9902 Segmental and somatic dysfunction of thoracic region: Secondary | ICD-10-CM | POA: Diagnosis not present

## 2021-11-26 DIAGNOSIS — M546 Pain in thoracic spine: Secondary | ICD-10-CM | POA: Diagnosis not present

## 2021-11-26 DIAGNOSIS — M9901 Segmental and somatic dysfunction of cervical region: Secondary | ICD-10-CM | POA: Diagnosis not present

## 2021-11-29 DIAGNOSIS — M50122 Cervical disc disorder at C5-C6 level with radiculopathy: Secondary | ICD-10-CM | POA: Diagnosis not present

## 2021-11-29 DIAGNOSIS — M9901 Segmental and somatic dysfunction of cervical region: Secondary | ICD-10-CM | POA: Diagnosis not present

## 2021-11-29 DIAGNOSIS — M9902 Segmental and somatic dysfunction of thoracic region: Secondary | ICD-10-CM | POA: Diagnosis not present

## 2021-11-29 DIAGNOSIS — M546 Pain in thoracic spine: Secondary | ICD-10-CM | POA: Diagnosis not present

## 2021-11-30 DIAGNOSIS — M9902 Segmental and somatic dysfunction of thoracic region: Secondary | ICD-10-CM | POA: Diagnosis not present

## 2021-11-30 DIAGNOSIS — M9901 Segmental and somatic dysfunction of cervical region: Secondary | ICD-10-CM | POA: Diagnosis not present

## 2021-11-30 DIAGNOSIS — M546 Pain in thoracic spine: Secondary | ICD-10-CM | POA: Diagnosis not present

## 2021-11-30 DIAGNOSIS — M50122 Cervical disc disorder at C5-C6 level with radiculopathy: Secondary | ICD-10-CM | POA: Diagnosis not present

## 2021-12-03 DIAGNOSIS — M9902 Segmental and somatic dysfunction of thoracic region: Secondary | ICD-10-CM | POA: Diagnosis not present

## 2021-12-03 DIAGNOSIS — M9901 Segmental and somatic dysfunction of cervical region: Secondary | ICD-10-CM | POA: Diagnosis not present

## 2021-12-03 DIAGNOSIS — M50122 Cervical disc disorder at C5-C6 level with radiculopathy: Secondary | ICD-10-CM | POA: Diagnosis not present

## 2021-12-03 DIAGNOSIS — M546 Pain in thoracic spine: Secondary | ICD-10-CM | POA: Diagnosis not present

## 2021-12-06 DIAGNOSIS — M546 Pain in thoracic spine: Secondary | ICD-10-CM | POA: Diagnosis not present

## 2021-12-06 DIAGNOSIS — M9901 Segmental and somatic dysfunction of cervical region: Secondary | ICD-10-CM | POA: Diagnosis not present

## 2021-12-06 DIAGNOSIS — M9902 Segmental and somatic dysfunction of thoracic region: Secondary | ICD-10-CM | POA: Diagnosis not present

## 2021-12-06 DIAGNOSIS — M50122 Cervical disc disorder at C5-C6 level with radiculopathy: Secondary | ICD-10-CM | POA: Diagnosis not present

## 2021-12-07 DIAGNOSIS — M9902 Segmental and somatic dysfunction of thoracic region: Secondary | ICD-10-CM | POA: Diagnosis not present

## 2021-12-07 DIAGNOSIS — M546 Pain in thoracic spine: Secondary | ICD-10-CM | POA: Diagnosis not present

## 2021-12-07 DIAGNOSIS — M50122 Cervical disc disorder at C5-C6 level with radiculopathy: Secondary | ICD-10-CM | POA: Diagnosis not present

## 2021-12-07 DIAGNOSIS — M9901 Segmental and somatic dysfunction of cervical region: Secondary | ICD-10-CM | POA: Diagnosis not present

## 2021-12-08 DIAGNOSIS — M9902 Segmental and somatic dysfunction of thoracic region: Secondary | ICD-10-CM | POA: Diagnosis not present

## 2021-12-08 DIAGNOSIS — M546 Pain in thoracic spine: Secondary | ICD-10-CM | POA: Diagnosis not present

## 2021-12-08 DIAGNOSIS — M50122 Cervical disc disorder at C5-C6 level with radiculopathy: Secondary | ICD-10-CM | POA: Diagnosis not present

## 2021-12-08 DIAGNOSIS — M9901 Segmental and somatic dysfunction of cervical region: Secondary | ICD-10-CM | POA: Diagnosis not present

## 2021-12-13 DIAGNOSIS — M9902 Segmental and somatic dysfunction of thoracic region: Secondary | ICD-10-CM | POA: Diagnosis not present

## 2021-12-13 DIAGNOSIS — M50122 Cervical disc disorder at C5-C6 level with radiculopathy: Secondary | ICD-10-CM | POA: Diagnosis not present

## 2021-12-13 DIAGNOSIS — M9901 Segmental and somatic dysfunction of cervical region: Secondary | ICD-10-CM | POA: Diagnosis not present

## 2021-12-13 DIAGNOSIS — M546 Pain in thoracic spine: Secondary | ICD-10-CM | POA: Diagnosis not present

## 2021-12-14 DIAGNOSIS — M9901 Segmental and somatic dysfunction of cervical region: Secondary | ICD-10-CM | POA: Diagnosis not present

## 2021-12-14 DIAGNOSIS — M546 Pain in thoracic spine: Secondary | ICD-10-CM | POA: Diagnosis not present

## 2021-12-14 DIAGNOSIS — M9902 Segmental and somatic dysfunction of thoracic region: Secondary | ICD-10-CM | POA: Diagnosis not present

## 2021-12-14 DIAGNOSIS — M50122 Cervical disc disorder at C5-C6 level with radiculopathy: Secondary | ICD-10-CM | POA: Diagnosis not present

## 2021-12-15 DIAGNOSIS — M546 Pain in thoracic spine: Secondary | ICD-10-CM | POA: Diagnosis not present

## 2021-12-15 DIAGNOSIS — M9901 Segmental and somatic dysfunction of cervical region: Secondary | ICD-10-CM | POA: Diagnosis not present

## 2021-12-15 DIAGNOSIS — M9902 Segmental and somatic dysfunction of thoracic region: Secondary | ICD-10-CM | POA: Diagnosis not present

## 2021-12-15 DIAGNOSIS — M50122 Cervical disc disorder at C5-C6 level with radiculopathy: Secondary | ICD-10-CM | POA: Diagnosis not present

## 2021-12-21 DIAGNOSIS — M546 Pain in thoracic spine: Secondary | ICD-10-CM | POA: Diagnosis not present

## 2021-12-21 DIAGNOSIS — M50122 Cervical disc disorder at C5-C6 level with radiculopathy: Secondary | ICD-10-CM | POA: Diagnosis not present

## 2021-12-21 DIAGNOSIS — M9901 Segmental and somatic dysfunction of cervical region: Secondary | ICD-10-CM | POA: Diagnosis not present

## 2021-12-21 DIAGNOSIS — M9902 Segmental and somatic dysfunction of thoracic region: Secondary | ICD-10-CM | POA: Diagnosis not present

## 2021-12-22 DIAGNOSIS — M9901 Segmental and somatic dysfunction of cervical region: Secondary | ICD-10-CM | POA: Diagnosis not present

## 2021-12-22 DIAGNOSIS — M9902 Segmental and somatic dysfunction of thoracic region: Secondary | ICD-10-CM | POA: Diagnosis not present

## 2021-12-22 DIAGNOSIS — M50122 Cervical disc disorder at C5-C6 level with radiculopathy: Secondary | ICD-10-CM | POA: Diagnosis not present

## 2021-12-22 DIAGNOSIS — M546 Pain in thoracic spine: Secondary | ICD-10-CM | POA: Diagnosis not present

## 2021-12-29 DIAGNOSIS — M546 Pain in thoracic spine: Secondary | ICD-10-CM | POA: Diagnosis not present

## 2021-12-29 DIAGNOSIS — M9901 Segmental and somatic dysfunction of cervical region: Secondary | ICD-10-CM | POA: Diagnosis not present

## 2021-12-29 DIAGNOSIS — M50122 Cervical disc disorder at C5-C6 level with radiculopathy: Secondary | ICD-10-CM | POA: Diagnosis not present

## 2021-12-29 DIAGNOSIS — M9902 Segmental and somatic dysfunction of thoracic region: Secondary | ICD-10-CM | POA: Diagnosis not present

## 2022-01-03 DIAGNOSIS — M9901 Segmental and somatic dysfunction of cervical region: Secondary | ICD-10-CM | POA: Diagnosis not present

## 2022-01-03 DIAGNOSIS — M9902 Segmental and somatic dysfunction of thoracic region: Secondary | ICD-10-CM | POA: Diagnosis not present

## 2022-01-03 DIAGNOSIS — M546 Pain in thoracic spine: Secondary | ICD-10-CM | POA: Diagnosis not present

## 2022-01-03 DIAGNOSIS — M50122 Cervical disc disorder at C5-C6 level with radiculopathy: Secondary | ICD-10-CM | POA: Diagnosis not present

## 2022-01-04 DIAGNOSIS — M9901 Segmental and somatic dysfunction of cervical region: Secondary | ICD-10-CM | POA: Diagnosis not present

## 2022-01-04 DIAGNOSIS — M50122 Cervical disc disorder at C5-C6 level with radiculopathy: Secondary | ICD-10-CM | POA: Diagnosis not present

## 2022-01-04 DIAGNOSIS — M546 Pain in thoracic spine: Secondary | ICD-10-CM | POA: Diagnosis not present

## 2022-01-04 DIAGNOSIS — M9902 Segmental and somatic dysfunction of thoracic region: Secondary | ICD-10-CM | POA: Diagnosis not present

## 2022-08-14 IMAGING — MR MR SHOULDER*R* W/O CM
5 series · 36 of 40 positions shown · non-contrast
Comparison: None.

CLINICAL DATA: Right shoulder pain anteriorly for 2 years.

EXAM:
MRI OF THE RIGHT SHOULDER WITHOUT CONTRAST
TECHNIQUE: Multiplanar, multisequence MR imaging of the shoulder was performed.
No intravenous contrast was administered.

[Series 3: T2 fat-sat · axial · 4.0mm · 0.55mm/px · z∈[-32,+59]mm · 8 of 21 slices shown (1 of 3)]
[im 1/21]
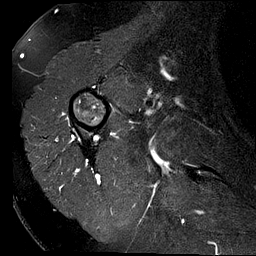
[im 3/21]
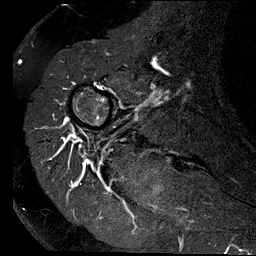
[im 6/21]
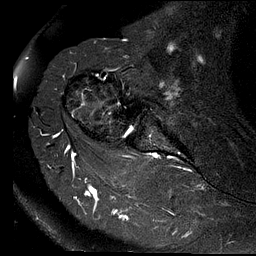
[im 9/21]
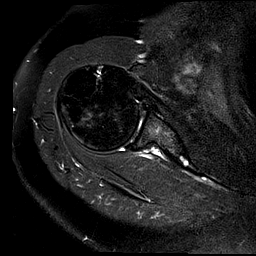
[im 12/21]
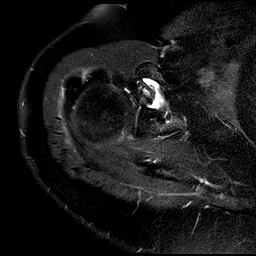
[im 15/21]
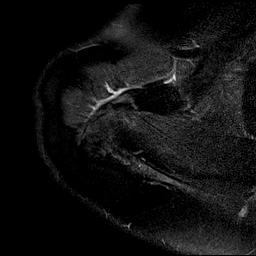
[im 18/21]
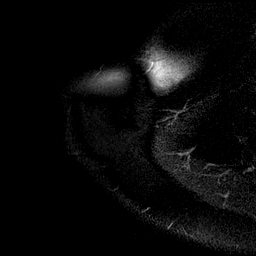
[im 21/21]
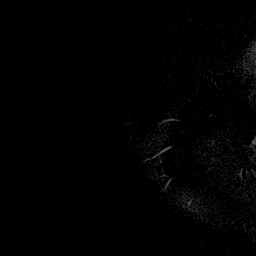

[Series 4: T2 fat-sat · oblique · 4.0mm · 0.59mm/px · 8 of 17 slices shown (2 of 3)]
[im 1/17]
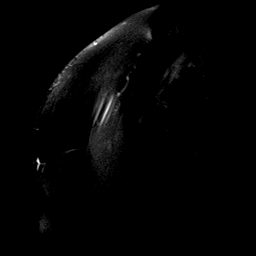
[im 3/17]
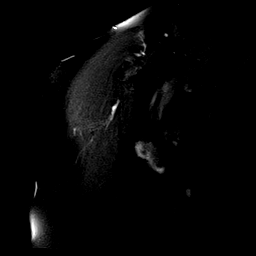
[im 5/17]
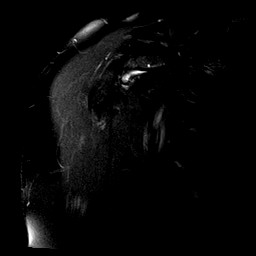
[im 7/17]
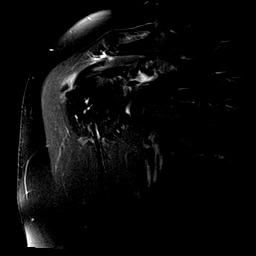
[im 10/17]
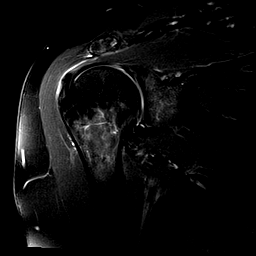
[im 12/17]
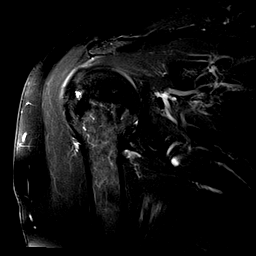
[im 14/17]
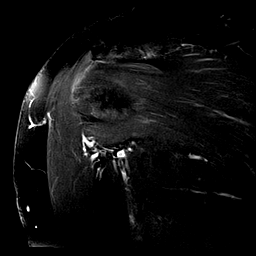
[im 17/17]
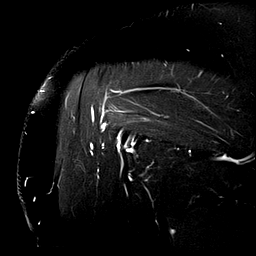

[Series 5: PD · oblique · 4.0mm · 0.29mm/px · 8 of 17 slices shown]
[im 1/17]
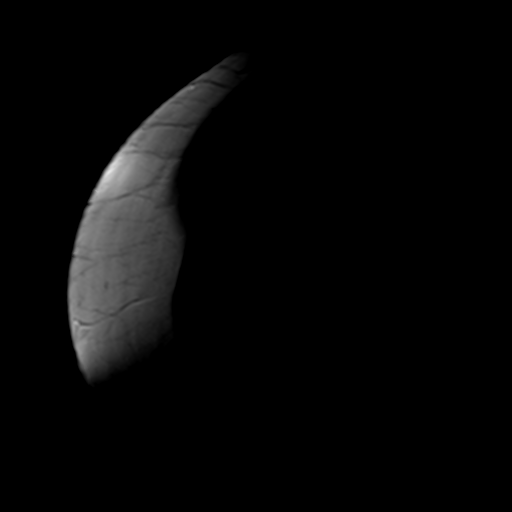
[im 3/17]
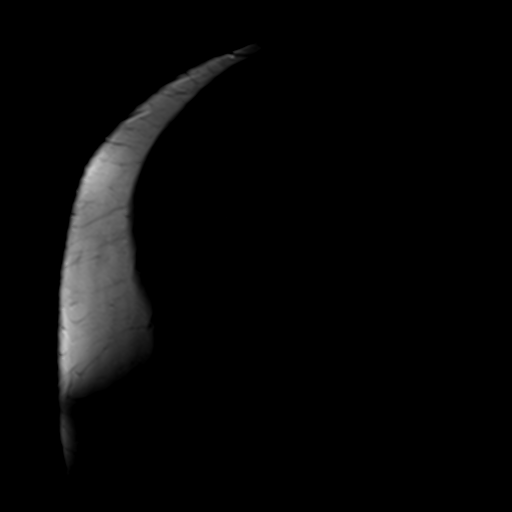
[im 5/17]
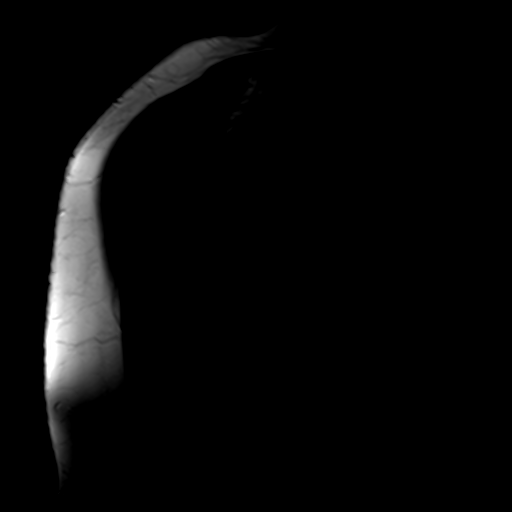
[im 7/17]
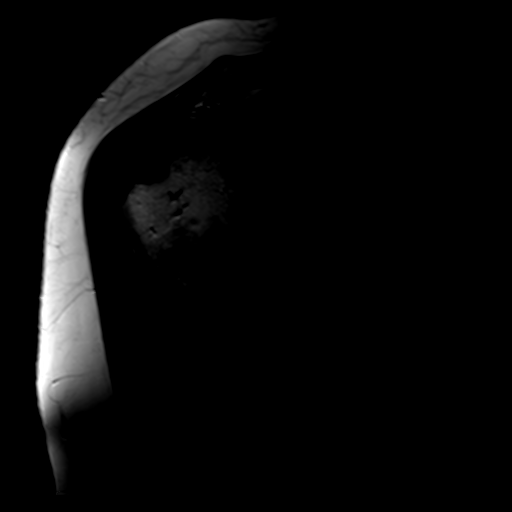
[im 10/17]
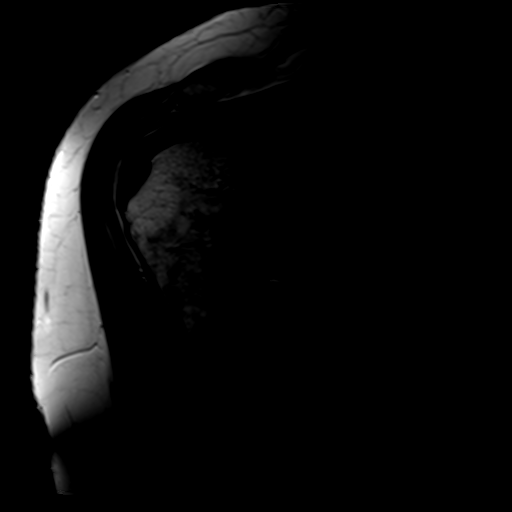
[im 12/17]
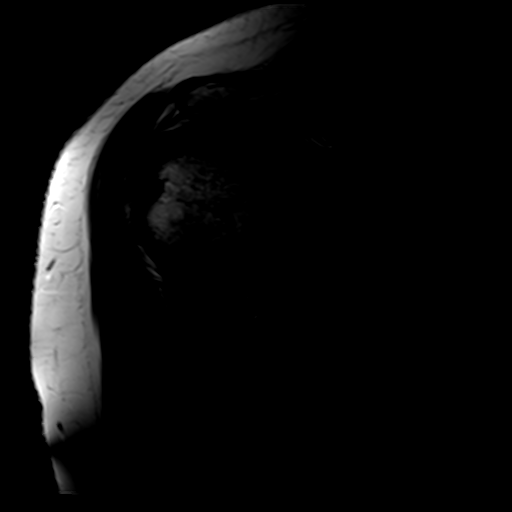
[im 14/17]
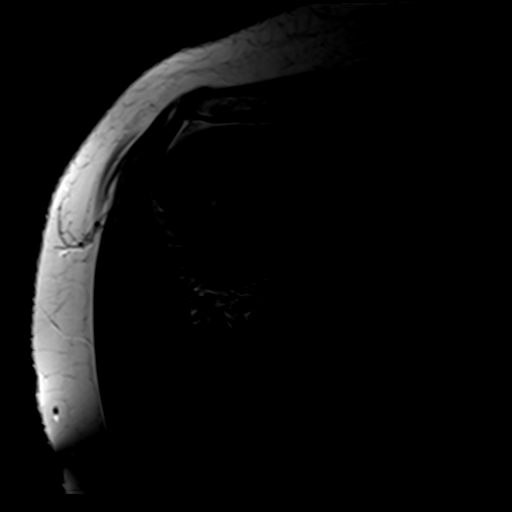
[im 17/17]
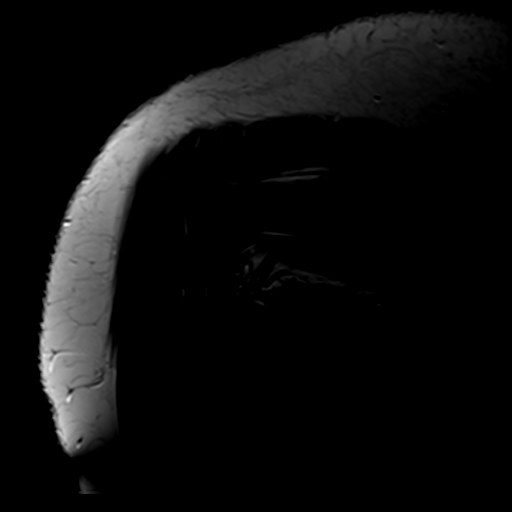

[Series 6: T2 fat-sat · oblique · 4.0mm · 0.59mm/px · 8 of 17 slices shown (3 of 3)]
[im 1/17]
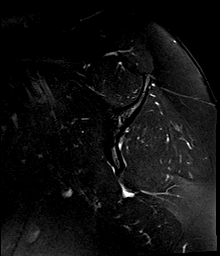
[im 3/17]
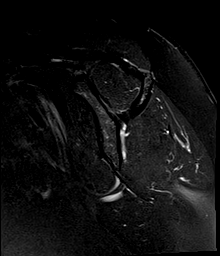
[im 5/17]
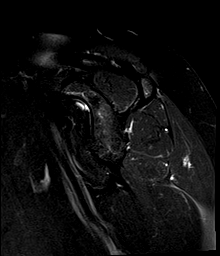
[im 7/17]
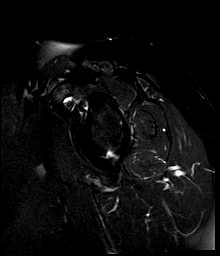
[im 10/17]
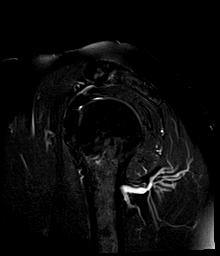
[im 12/17]
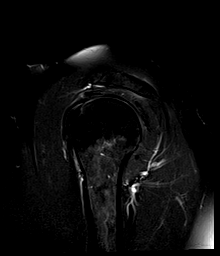
[im 14/17]
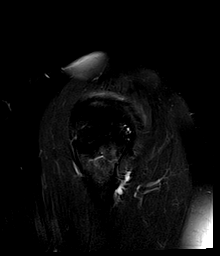
[im 17/17]
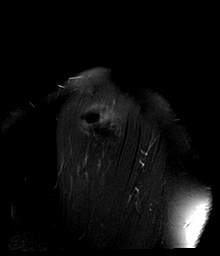

[Series 7: T1 · oblique · 4.0mm · 0.29mm/px · 4 of 17 slices shown]
[im 1/17]
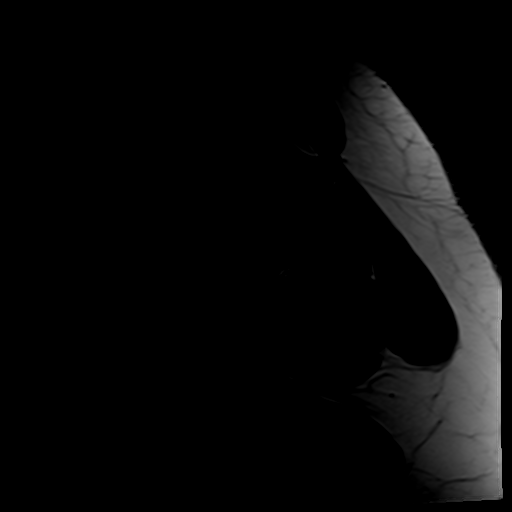
[im 3/17]
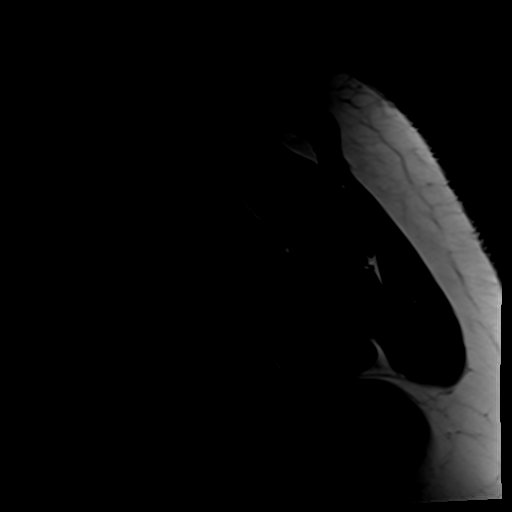
[im 5/17]
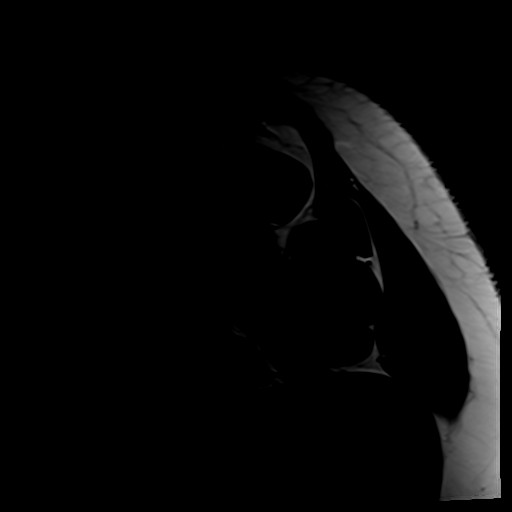
[im 7/17]
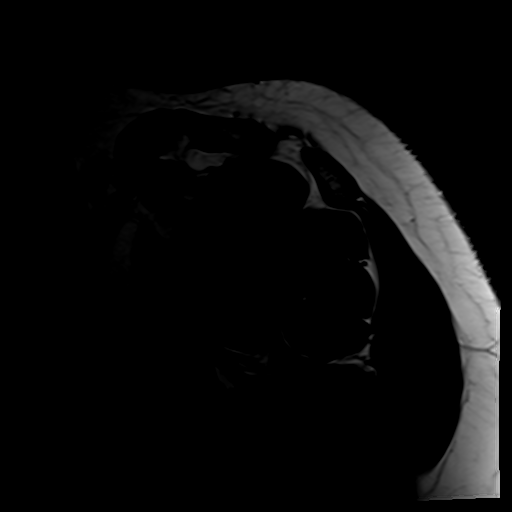

[36 of 40 positions shown; findings below may reference images not displayed]

FINDINGS: Rotator cuff: Substantial calcific tendinopathy of the supraspinatus
tendon, with dense calcification along the superficial margin of the
tendon and partially extending into the subacromial subdeltoid
bursa, with surrounding moderate accentuated signal in the
supraspinatus tendon compatible with tendinopathy and partial
tearing along the bursal surface. No full-thickness tear identified.

Muscles:  Unremarkable

Biceps long head:  Unremarkable

Acromioclavicular Joint: Unremarkable type II acromion. Mild
subacromial subdeltoid bursitis.

Glenohumeral Joint: Mild degenerative glenohumeral chondral
thinning.

Labrum: Linear accentuated signal in the inferior labrum on images 8
through 9 of series 4 could reflect a small labral tear, or edema
within a small fragmented spur.

Bones:  Unremarkable

Other: No supplemental non-categorized findings.
IMPRESSION: 1. Substantial calcific tendinopathy in the supraspinatus tendon,
with a dense calcification partially extending into the subacromial
subdeltoid bursa and primarily in the superficial portion of the
distal tendon where there is potentially some mild partial tearing.
2. Mild subacromial subdeltoid bursitis.
3. Linear accentuated signal along the inferior glenoid could
reflect a small labral tear or edema signal in a small fragmented
osteophyte.

## 2022-09-16 ENCOUNTER — Emergency Department: Payer: BC Managed Care – PPO

## 2022-09-16 ENCOUNTER — Inpatient Hospital Stay (HOSPITAL_COMMUNITY)
Admit: 2022-09-16 | Discharge: 2022-09-20 | DRG: 065 | Disposition: A | Discharge status: Rehabilitation Facility | Payer: BC Managed Care – PPO | Source: Other Acute Inpatient Hospital | Attending: Family Medicine | Admitting: Family Medicine

## 2022-09-16 ENCOUNTER — Inpatient Hospital Stay (HOSPITAL_COMMUNITY): Payer: BC Managed Care – PPO

## 2022-09-16 ENCOUNTER — Other Ambulatory Visit: Payer: Self-pay

## 2022-09-16 ENCOUNTER — Encounter (HOSPITAL_COMMUNITY): Payer: Self-pay

## 2022-09-16 ENCOUNTER — Inpatient Hospital Stay
Admission: EM | Admit: 2022-09-16 | Discharge: 2022-09-16 | DRG: 065 | Disposition: A | Discharge status: Other Acute Inpatient Hospital | Payer: BC Managed Care – PPO | Source: Other Acute Inpatient Hospital | Attending: Emergency Medicine | Admitting: Emergency Medicine

## 2022-09-16 DIAGNOSIS — G441 Vascular headache, not elsewhere classified: Secondary | ICD-10-CM | POA: Diagnosis not present

## 2022-09-16 DIAGNOSIS — F419 Anxiety disorder, unspecified: Secondary | ICD-10-CM | POA: Diagnosis present

## 2022-09-16 DIAGNOSIS — I69154 Hemiplegia and hemiparesis following nontraumatic intracerebral hemorrhage affecting left non-dominant side: Secondary | ICD-10-CM | POA: Diagnosis not present

## 2022-09-16 DIAGNOSIS — R4701 Aphasia: Secondary | ICD-10-CM | POA: Diagnosis not present

## 2022-09-16 DIAGNOSIS — R29709 NIHSS score 9: Secondary | ICD-10-CM | POA: Diagnosis present

## 2022-09-16 DIAGNOSIS — R109 Unspecified abdominal pain: Secondary | ICD-10-CM | POA: Diagnosis not present

## 2022-09-16 DIAGNOSIS — Z79899 Other long term (current) drug therapy: Secondary | ICD-10-CM | POA: Diagnosis not present

## 2022-09-16 DIAGNOSIS — R471 Dysarthria and anarthria: Secondary | ICD-10-CM | POA: Diagnosis present

## 2022-09-16 DIAGNOSIS — Z8249 Family history of ischemic heart disease and other diseases of the circulatory system: Secondary | ICD-10-CM | POA: Diagnosis not present

## 2022-09-16 DIAGNOSIS — I161 Hypertensive emergency: Secondary | ICD-10-CM | POA: Diagnosis present

## 2022-09-16 DIAGNOSIS — N179 Acute kidney failure, unspecified: Secondary | ICD-10-CM | POA: Diagnosis not present

## 2022-09-16 DIAGNOSIS — L409 Psoriasis, unspecified: Secondary | ICD-10-CM | POA: Diagnosis not present

## 2022-09-16 DIAGNOSIS — Z91148 Patient's other noncompliance with medication regimen for other reason: Secondary | ICD-10-CM

## 2022-09-16 DIAGNOSIS — F5104 Psychophysiologic insomnia: Secondary | ICD-10-CM | POA: Diagnosis not present

## 2022-09-16 DIAGNOSIS — R531 Weakness: Secondary | ICD-10-CM | POA: Diagnosis not present

## 2022-09-16 DIAGNOSIS — R29705 NIHSS score 5: Secondary | ICD-10-CM | POA: Diagnosis not present

## 2022-09-16 DIAGNOSIS — R4781 Slurred speech: Secondary | ICD-10-CM | POA: Diagnosis present

## 2022-09-16 DIAGNOSIS — I619 Nontraumatic intracerebral hemorrhage, unspecified: Principal | ICD-10-CM | POA: Diagnosis present

## 2022-09-16 DIAGNOSIS — D72829 Elevated white blood cell count, unspecified: Secondary | ICD-10-CM | POA: Diagnosis present

## 2022-09-16 DIAGNOSIS — E538 Deficiency of other specified B group vitamins: Secondary | ICD-10-CM | POA: Diagnosis not present

## 2022-09-16 DIAGNOSIS — I6389 Other cerebral infarction: Secondary | ICD-10-CM | POA: Diagnosis not present

## 2022-09-16 DIAGNOSIS — Z1152 Encounter for screening for COVID-19: Secondary | ICD-10-CM

## 2022-09-16 DIAGNOSIS — Z8262 Family history of osteoporosis: Secondary | ICD-10-CM | POA: Diagnosis not present

## 2022-09-16 DIAGNOSIS — E785 Hyperlipidemia, unspecified: Secondary | ICD-10-CM | POA: Diagnosis not present

## 2022-09-16 DIAGNOSIS — I10 Essential (primary) hypertension: Secondary | ICD-10-CM | POA: Diagnosis present

## 2022-09-16 DIAGNOSIS — R197 Diarrhea, unspecified: Secondary | ICD-10-CM | POA: Diagnosis not present

## 2022-09-16 DIAGNOSIS — Z823 Family history of stroke: Secondary | ICD-10-CM | POA: Diagnosis not present

## 2022-09-16 DIAGNOSIS — I610 Nontraumatic intracerebral hemorrhage in hemisphere, subcortical: Secondary | ICD-10-CM | POA: Diagnosis not present

## 2022-09-16 DIAGNOSIS — F482 Pseudobulbar affect: Secondary | ICD-10-CM | POA: Diagnosis not present

## 2022-09-16 DIAGNOSIS — I615 Nontraumatic intracerebral hemorrhage, intraventricular: Secondary | ICD-10-CM | POA: Diagnosis present

## 2022-09-16 DIAGNOSIS — F4325 Adjustment disorder with mixed disturbance of emotions and conduct: Secondary | ICD-10-CM | POA: Diagnosis not present

## 2022-09-16 DIAGNOSIS — F411 Generalized anxiety disorder: Secondary | ICD-10-CM | POA: Diagnosis not present

## 2022-09-16 DIAGNOSIS — E871 Hypo-osmolality and hyponatremia: Secondary | ICD-10-CM | POA: Diagnosis not present

## 2022-09-16 DIAGNOSIS — Z833 Family history of diabetes mellitus: Secondary | ICD-10-CM | POA: Diagnosis not present

## 2022-09-16 DIAGNOSIS — I672 Cerebral atherosclerosis: Secondary | ICD-10-CM | POA: Diagnosis not present

## 2022-09-16 DIAGNOSIS — I160 Hypertensive urgency: Secondary | ICD-10-CM | POA: Diagnosis not present

## 2022-09-16 DIAGNOSIS — Z8679 Personal history of other diseases of the circulatory system: Secondary | ICD-10-CM | POA: Diagnosis present

## 2022-09-16 DIAGNOSIS — R7401 Elevation of levels of liver transaminase levels: Secondary | ICD-10-CM | POA: Diagnosis not present

## 2022-09-16 DIAGNOSIS — R29818 Other symptoms and signs involving the nervous system: Secondary | ICD-10-CM | POA: Diagnosis not present

## 2022-09-16 DIAGNOSIS — A419 Sepsis, unspecified organism: Secondary | ICD-10-CM | POA: Diagnosis not present

## 2022-09-16 DIAGNOSIS — I639 Cerebral infarction, unspecified: Secondary | ICD-10-CM | POA: Diagnosis not present

## 2022-09-16 DIAGNOSIS — R29810 Facial weakness: Secondary | ICD-10-CM | POA: Diagnosis present

## 2022-09-16 DIAGNOSIS — I6523 Occlusion and stenosis of bilateral carotid arteries: Secondary | ICD-10-CM | POA: Diagnosis not present

## 2022-09-16 DIAGNOSIS — G8194 Hemiplegia, unspecified affecting left nondominant side: Secondary | ICD-10-CM | POA: Diagnosis present

## 2022-09-16 DIAGNOSIS — Z90710 Acquired absence of both cervix and uterus: Secondary | ICD-10-CM

## 2022-09-16 DIAGNOSIS — I69254 Hemiplegia and hemiparesis following other nontraumatic intracranial hemorrhage affecting left non-dominant side: Secondary | ICD-10-CM | POA: Diagnosis not present

## 2022-09-16 DIAGNOSIS — Z818 Family history of other mental and behavioral disorders: Secondary | ICD-10-CM

## 2022-09-16 DIAGNOSIS — R7989 Other specified abnormal findings of blood chemistry: Secondary | ICD-10-CM | POA: Diagnosis not present

## 2022-09-16 DIAGNOSIS — R519 Headache, unspecified: Secondary | ICD-10-CM | POA: Diagnosis not present

## 2022-09-16 DIAGNOSIS — G936 Cerebral edema: Secondary | ICD-10-CM | POA: Diagnosis not present

## 2022-09-16 HISTORY — DX: Strain of other muscle(s) and tendon(s) of posterior muscle group at lower leg level, left leg, initial encounter: S86.112A

## 2022-09-16 HISTORY — DX: Anxiety disorder, unspecified: F41.9

## 2022-09-16 HISTORY — DX: Other chronic pain: G89.29

## 2022-09-16 HISTORY — DX: Insomnia, unspecified: G47.00

## 2022-09-16 LAB — APTT
aPTT: 22 seconds — ABNORMAL LOW (ref 24–36)
aPTT: 24 seconds (ref 24–36)

## 2022-09-16 LAB — LIPID PANEL
Cholesterol: 310 mg/dL — ABNORMAL HIGH (ref 0–200)
HDL: 59 mg/dL (ref >40)
LDL Cholesterol: 206 mg/dL — ABNORMAL HIGH (ref 0–99)
Total CHOL/HDL Ratio: 5.3 RATIO
Triglycerides: 225 mg/dL — ABNORMAL HIGH (ref <150)
VLDL: 45 mg/dL — ABNORMAL HIGH (ref 0–40)

## 2022-09-16 LAB — PROTIME-INR
INR: 1 (ref 0.8–1.2)
INR: 1 (ref 0.8–1.2)
INR: 1 (ref 0.8–1.2)
Prothrombin Time: 13.3 seconds (ref 11.4–15.2)
Prothrombin Time: 12.8 seconds (ref 11.4–15.2)
Prothrombin Time: 13.4 seconds (ref 11.4–15.2)

## 2022-09-16 LAB — DIFFERENTIAL
Abs Immature Granulocytes: 0.05 10*3/uL (ref 0.00–0.07)
Basophils Absolute: 0.1 10*3/uL (ref 0.0–0.1)
Basophils Relative: 1 %
Eosinophils Absolute: 0.1 10*3/uL (ref 0.0–0.5)
Eosinophils Relative: 1 %
Immature Granulocytes: 1 %
Lymphocytes Relative: 23 %
Lymphs Abs: 2.3 10*3/uL (ref 0.7–4.0)
Monocytes Absolute: 0.7 10*3/uL (ref 0.1–1.0)
Monocytes Relative: 7 %
Neutro Abs: 7 10*3/uL (ref 1.7–7.7)
Neutrophils Relative %: 67 %

## 2022-09-16 LAB — COMPREHENSIVE METABOLIC PANEL
ALT: 20 U/L (ref 0–44)
AST: 24 U/L (ref 15–41)
Albumin: 3.9 g/dL (ref 3.5–5.0)
Alkaline Phosphatase: 124 U/L (ref 38–126)
Anion gap: 12 (ref 5–15)
BUN: 15 mg/dL (ref 6–20)
CO2: 20 mmol/L — ABNORMAL LOW (ref 22–32)
Calcium: 8.9 mg/dL (ref 8.9–10.3)
Chloride: 103 mmol/L (ref 98–111)
Creatinine, Ser: 0.82 mg/dL (ref 0.44–1.00)
GFR, Estimated: >60 mL/min (ref >60)
Glucose, Bld: 131 mg/dL — ABNORMAL HIGH (ref 70–99)
Potassium: 3.6 mmol/L (ref 3.5–5.1)
Sodium: 135 mmol/L (ref 135–145)
Total Bilirubin: 0.9 mg/dL (ref 0.3–1.2)
Total Protein: 7.4 g/dL (ref 6.5–8.1)

## 2022-09-16 LAB — CBC
HCT: 44.7 % (ref 36.0–46.0)
Hemoglobin: 15 g/dL (ref 12.0–15.0)
MCH: 27.9 pg (ref 26.0–34.0)
MCHC: 33.6 g/dL (ref 30.0–36.0)
MCV: 83.2 fL (ref 80.0–100.0)
Platelets: 293 10*3/uL (ref 150–400)
RBC: 5.37 MIL/uL — ABNORMAL HIGH (ref 3.87–5.11)
RDW: 12.6 % (ref 11.5–15.5)
WBC: 10.3 10*3/uL (ref 4.0–10.5)
nRBC: 0 % (ref 0.0–0.2)

## 2022-09-16 LAB — RAPID URINE DRUG SCREEN, HOSP PERFORMED
Amphetamines: NOT DETECTED
Barbiturates: NOT DETECTED
Benzodiazepines: NOT DETECTED
Cocaine: NOT DETECTED
Opiates: NOT DETECTED
Tetrahydrocannabinol: NOT DETECTED

## 2022-09-16 LAB — RESP PANEL BY RT-PCR (RSV, FLU A&B, COVID)  RVPGX2
Influenza A by PCR: NEGATIVE
Influenza B by PCR: NEGATIVE
Resp Syncytial Virus by PCR: NEGATIVE
SARS Coronavirus 2 by RT PCR: NEGATIVE

## 2022-09-16 LAB — MRSA NEXT GEN BY PCR, NASAL: MRSA by PCR Next Gen: NOT DETECTED

## 2022-09-16 LAB — CBG MONITORING, ED: Glucose-Capillary: 127 mg/dL — ABNORMAL HIGH (ref 70–99)

## 2022-09-16 LAB — HIV ANTIBODY (ROUTINE TESTING W REFLEX): HIV Screen 4th Generation wRfx: NONREACTIVE

## 2022-09-16 MED ORDER — LORAZEPAM 2 MG/ML IJ SOLN
1.0000 mg | INTRAMUSCULAR | Status: AC | PRN
  Administered 2022-09-16 (×2): 1 mg via INTRAVENOUS
  Filled 2022-09-16 (×2): qty 1

## 2022-09-16 MED ORDER — DIPHENHYDRAMINE HCL 50 MG/ML IJ SOLN
25.0000 mg | Freq: Once | INTRAMUSCULAR | Status: AC
  Administered 2022-09-16: 25 mg via INTRAVENOUS
  Filled 2022-09-16: qty 1

## 2022-09-16 MED ORDER — GADOBUTROL 1 MMOL/ML IV SOLN
9.2000 mL | Freq: Once | INTRAVENOUS | Status: AC | PRN
  Administered 2022-09-16: 9.2 mL via INTRAVENOUS

## 2022-09-16 MED ORDER — CLEVIDIPINE BUTYRATE 0.5 MG/ML IV EMUL
0.0000 mg/h | INTRAVENOUS | Status: DC
  Administered 2022-09-16: 2 mg/h via INTRAVENOUS
  Filled 2022-09-16 (×3): qty 50

## 2022-09-16 MED ORDER — LORAZEPAM 1 MG PO TABS: 1.0000 mg | ORAL_TABLET | Freq: Once | ORAL | Status: DC

## 2022-09-16 MED ORDER — STROKE: EARLY STAGES OF RECOVERY BOOK: Freq: Once | Status: DC

## 2022-09-16 MED ORDER — ONDANSETRON HCL 4 MG/2ML IJ SOLN
4.0000 mg | Freq: Four times a day (QID) | INTRAMUSCULAR | Status: DC | PRN
  Administered 2022-09-16 – 2022-09-20 (×5): 4 mg via INTRAVENOUS
  Filled 2022-09-16 (×5): qty 2

## 2022-09-16 MED ORDER — STROKE: EARLY STAGES OF RECOVERY BOOK
Freq: Once | Status: AC
  Filled 2022-09-16: qty 1

## 2022-09-16 MED ORDER — LABETALOL HCL 5 MG/ML IV SOLN
10.0000 mg | INTRAVENOUS | Status: DC | PRN
  Administered 2022-09-16 – 2022-09-18 (×9): 10 mg via INTRAVENOUS
  Filled 2022-09-16 (×8): qty 4

## 2022-09-16 MED ORDER — ACETAMINOPHEN 160 MG/5ML PO SOLN: 650.0000 mg | ORAL | Status: DC | PRN

## 2022-09-16 MED ORDER — FENTANYL CITRATE PF 50 MCG/ML IJ SOSY
25.0000 ug | PREFILLED_SYRINGE | Freq: Once | INTRAMUSCULAR | Status: AC
  Administered 2022-09-16: 25 ug via INTRAVENOUS
  Filled 2022-09-16: qty 1

## 2022-09-16 MED ORDER — SENNOSIDES-DOCUSATE SODIUM 8.6-50 MG PO TABS
1.0000 | ORAL_TABLET | Freq: Two times a day (BID) | ORAL | Status: DC
  Administered 2022-09-18 – 2022-09-20 (×3): 1 via ORAL
  Filled 2022-09-16 (×4): qty 1

## 2022-09-16 MED ORDER — PANTOPRAZOLE SODIUM 40 MG IV SOLR
40.0000 mg | Freq: Every day | INTRAVENOUS | Status: DC
  Administered 2022-09-16: 40 mg via INTRAVENOUS
  Filled 2022-09-16: qty 10

## 2022-09-16 MED ORDER — IOHEXOL 350 MG/ML SOLN
75.0000 mL | Freq: Once | INTRAVENOUS | Status: AC | PRN
  Administered 2022-09-16: 75 mL via INTRAVENOUS

## 2022-09-16 MED ORDER — CLEVIDIPINE BUTYRATE 0.5 MG/ML IV EMUL
0.0000 mg/h | INTRAVENOUS | Status: DC
  Administered 2022-09-16: 12 mg/h via INTRAVENOUS
  Administered 2022-09-16: 18 mg/h via INTRAVENOUS
  Administered 2022-09-16: 20 mg/h via INTRAVENOUS
  Administered 2022-09-16 (×2): 21 mg/h via INTRAVENOUS
  Administered 2022-09-17: 9 mg/h via INTRAVENOUS
  Administered 2022-09-17: 10 mg/h via INTRAVENOUS
  Administered 2022-09-17: 7 mg/h via INTRAVENOUS
  Administered 2022-09-17: 5 mg/h via INTRAVENOUS
  Administered 2022-09-17: 10 mg/h via INTRAVENOUS
  Administered 2022-09-17: 11 mg/h via INTRAVENOUS
  Administered 2022-09-17: 7 mg/h via INTRAVENOUS
  Administered 2022-09-18 (×2): 8 mg/h via INTRAVENOUS
  Administered 2022-09-18 (×2): 9 mg/h via INTRAVENOUS
  Administered 2022-09-18: 5 mg/h via INTRAVENOUS
  Filled 2022-09-16 (×13): qty 50
  Filled 2022-09-16: qty 150

## 2022-09-16 MED ORDER — LORAZEPAM 2 MG/ML IJ SOLN
1.0000 mg | Freq: Once | INTRAMUSCULAR | Status: AC
  Administered 2022-09-16: 1 mg via INTRAVENOUS
  Filled 2022-09-16: qty 1

## 2022-09-16 MED ORDER — CHLORHEXIDINE GLUCONATE CLOTH 2 % EX PADS
6.0000 | MEDICATED_PAD | Freq: Every day | CUTANEOUS | Status: DC
  Administered 2022-09-16 – 2022-09-20 (×5): 6 via TOPICAL

## 2022-09-16 MED ORDER — ACETAMINOPHEN 325 MG PO TABS
650.0000 mg | ORAL_TABLET | ORAL | Status: DC | PRN
  Administered 2022-09-16 – 2022-09-19 (×4): 650 mg via ORAL
  Filled 2022-09-16 (×4): qty 2

## 2022-09-16 MED ORDER — SODIUM CHLORIDE 0.9% FLUSH: 3.0000 mL | Freq: Once | INTRAVENOUS | Status: DC

## 2022-09-16 MED ORDER — ACETAMINOPHEN 650 MG RE SUPP: 650.0000 mg | RECTAL | Status: DC | PRN

## 2022-09-16 NOTE — Code Documentation (Addendum)
Stroke Response Nurse Documentation Code Documentation  Kelly Stephenson is a 52 y.o. female arriving to [facility] via [facility] on 09/16/2022 with past medical hx of HTN. On No antithrombotic. Code stroke was activated by EMS.   Patient from work where she was LKW at 1250 and now complaining of left sided weakness and slurred speech. Patient was working when she suddenly developed left sided facial, arm, leg weakness, and slurred speech.  Stroke team at the bedside on patient arrival. Patient to CT with team. CBG checked in CT 127. NIHSS 9, see documentation for details and code stroke times. Patient with left facial droop, left arm weakness, left leg weakness, left decreased sensation, Expressive aphasia , and dysarthria  on exam. The following imaging was completed:  CT Head and CTA. Patient is not a candidate for IV Thrombolytic due to hemorrhagic brain bleed on imaging per MD. Patient is not a candidate for IR due to hemorrhagic bleed on imaging. Cleviprex drip initiated after imaging.   Care Plan: SBP 130-150 Q1H NIHSS, tx to [facility].   Bedside handoff with ED RN Lattie Haw.    Charise Carwin  Stroke Response RN

## 2022-09-16 NOTE — ED Provider Notes (Signed)
[facility] Provider Note    Event Date/Time   First MD Initiated Contact with Patient 09/16/22 1359     (approximate)   History   Code Stroke   HPI  Kelly Stephenson is a 52 y.o. female with no significant past medical history here with acute onset left-sided weakness.  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  Initial blood pressure 210/110 with EMS.  She was activated as a code stroke and brought to the ED.  She currently endorses ongoing weakness and numbness throughout her left side as well as a sensation of confusion.  She now has a mild headache.  Denies history of similar episodes.  She does states she has a strong family history of strokes.     Physical Exam   Triage Vital Signs: ED Triage Vitals  Enc Vitals Group     BP 09/16/22 1352 (!) 214/108     Pulse Rate 09/16/22 1400 (!) 106     Resp 09/16/22 1400 17     Temp --      Temp src --      SpO2 09/16/22 1400 100 %     Weight --      Height --      Head Circumference --      Peak Flow --      Pain Score --      Pain Loc --      Pain Edu? --      Excl. in [place]? --     Most recent vital signs: Vitals:   09/16/22 1445 09/16/22 1448  BP: 134/67   Pulse: (!) 111 (!) 109  Resp: (!) 26 13  Temp:  (!) 97.4 F (36.3 C)  SpO2: 100% 100%     General: Awake, no distress.  CV:  Good peripheral perfusion.  Resp:  Normal effort.  Abd:  No distention.  Other:  Speech mildly dysarthric.  Strength in left upper extremity able to wiggle fingers only and wiggle toes but not raise against gravity.  Right upper extremity and right lower extremity normal.  Left upper extremity numbness.   ED Results / Procedures / Treatments   Labs (all labs ordered are listed, but only abnormal results are displayed) Labs Reviewed  APTT - Abnormal; Notable for the following components:       Result Value   aPTT 22 (*)    All other components within normal limits  CBC - Abnormal; Notable for the following components:   RBC 5.37 (*)    All other components within normal limits  COMPREHENSIVE METABOLIC PANEL - Abnormal; Notable for the following components:   CO2 20 (*)    Glucose, Bld 131 (*)    All other components within normal limits  CBG MONITORING, ED - Abnormal; Notable for the following components:   Glucose-Capillary 127 (*)    All other components within normal limits  PROTIME-INR  DIFFERENTIAL  ETHANOL  POC URINE PREG, ED     EKG Normal sinus rhythm, trickle rate 95.  PR 118, QRS 93, QTc 44.  No acute ST elevations or depressions.   RADIOLOGY CT head: Acute basal ganglia hemorrhage with intraventricular extension CT angio: No vascular malformation   I also independently reviewed and agree with radiologist interpretations.   PROCEDURES:  Critical Care performed: Yes, see critical care procedure note(s)  .  Critical Care  Performed by: Duffy Bruce, MD Authorized by: Duffy Bruce, MD   Critical care provider statement:    Critical care time (minutes):  45   Critical care time was exclusive of:  Separately billable procedures and treating other patients   Critical care was necessary to treat or prevent imminent or life-threatening deterioration of the following conditions:  Cardiac failure, circulatory failure, respiratory failure and CNS failure or compromise   Critical care was time spent personally by me on the following activities:  Development of treatment plan with patient or surrogate, discussions with consultants, evaluation of patient's response to treatment, examination of patient, ordering and review of laboratory studies, ordering and review of radiographic studies, ordering and performing treatments and interventions, pulse oximetry, re-evaluation of patient's condition and review of old charts     MEDICATIONS ORDERED IN  ED: Medications  sodium chloride flush (NS) 0.9 % injection 3 mL (has no administration in time range)  clevidipine (CLEVIPREX) infusion 0.5 mg/mL (17 mg/hr Intravenous Infusion Verify 09/16/22 1506)   stroke: early stages of recovery book (has no administration in time range)  iohexol (OMNIPAQUE) 350 MG/ML injection 75 mL (75 mLs Intravenous Contrast Given 09/16/22 1334)     IMPRESSION / MDM / [place] / ED COURSE  I reviewed the triage vital signs and the nursing notes.                              Differential diagnosis includes, but is not limited to, ICH, aneurysmal bleed, intracranial lesion w hemorrhagic conversion.  Patient's presentation is most consistent with acute presentation with potential threat to life or bodily function.  The patient is on the cardiac monitor to evaluate for evidence of arrhythmia and/or significant heart rate changes.  52 year old female here with acute onset left-sided numbness.  Activated as a code stroke.  CT head immediately obtained and shows basal ganglia hemorrhage with intraventricular extension of hemorrhage.  No evidence of hydro.  Patient protecting airway at this time.  She was immediately started on Cleviprex and transferred to [facility] neuro ICU initiated.  CBC shows no leukocytosis.  Platelets are normal.  She is not on anticoagulation.  INR is normal.  Patient updated and in agreement with this plan.    FINAL CLINICAL IMPRESSION(S) / ED DIAGNOSES   Final diagnoses:  Hemorrhagic stroke ([place])     Rx / DC Orders   ED Discharge Orders     None        Note:  This document was prepared using Dragon voice recognition software and may include unintentional dictation errors.   Duffy Bruce, MD 09/16/22 [phone]

## 2022-09-16 NOTE — ED Notes (Signed)
[facility] at bedside 

## 2022-09-16 NOTE — Progress Notes (Signed)
CODE STROKE- PHARMACY COMMUNICATION   Time CODE STROKE called/page received:1313  Time response to CODE STROKE was made (in person or via phone): 1320  Time Stroke Kit retrieved from [place] (only if needed): N/A  Name of Provider/Nurse contacted:Dr. Quinn Axe  Past Medical History:  Diagnosis Date   Hypertension    Prior to Admission medications   Medication Sig Start Date End Date Taking? Authorizing Provider  amLODipine (NORVASC) 5 MG tablet Take 1 tablet (5 mg total) by mouth daily. 12/11/20   Birdie Sons, MD  azithromycin (ZITHROMAX Z-PAK) 250 MG tablet Take 2 tablets (500 mg) on  Day 1,  followed by 1 tablet (250 mg) once daily on Days 2 through 5. 07/23/21   Duffy Bruce, MD  chlorpheniramine-HYDROcodone [facility] PENNKINETIC ER) 10-8 MG/5ML SUER Take 5 mLs by mouth every 12 (twelve) hours as needed for cough. 07/23/21   Duffy Bruce, MD  metoprolol succinate (TOPROL-XL) 25 MG 24 hr tablet Take 1 tablet (25 mg total) by mouth daily. 11/27/20   Birdie Sons, MD  triamcinolone ointment (KENALOG) 0.5 % Apply 1 application topically 2 (two) times daily. 12/11/20   Birdie Sons, MD    Alison Murray ,PharmD Clinical Pharmacist  09/16/2022  1:36 PM

## 2022-09-16 NOTE — Progress Notes (Addendum)
1715:  RN called MRI for STAT MRI, all scanners currently in use.  MRI tech will notify RN.  RN to notify stroke team.   1800:  MRI in progress, Stroke team updated.  CT at 2000 per stroke team.

## 2022-09-16 NOTE — ED Notes (Addendum)
Pin pad not working. Patient's husband signed paper consent for transport and placed in chart due to hemorraghic stroke.

## 2022-09-16 NOTE — Progress Notes (Signed)
2100: patient complaining of a slight headache with pressure, neuro exam remains the same, Dr. Milas Gain aware, stat CT ordered.   2106: patient rolling around in the bed crying out for being uncomfortable, wanting all medical monitoring devices removed, attempts made to reposition patient and taken to CT. Per patient she has anxiety at baseline.   2140: CT unchanged per Dr. Milas Gain, patient still uncomfortable rolling around in bed, crying out, screams to slight touch, multiple attempts made to reposition patient for comfort. One time dose of IV benadryl ordered.   After benadryl patient calmed for a very short while about 1-2 minutes and began crying out and rolling around in bed saying she was uncomfortable, again multiple attempts made to help reposition the patient failed. BP cuff changed to a different site, cleviprex gtt changed to a different IV, patient given a fan. Dr. Milas Gain aware orders received for ativan.  2228: 1mg  ativan given with no change 2248: additional 1mg  of ativan given with no change Dr. Milas Gain aware, orders received for fentanyl. Patient resting comfortably after receiving fentanyl.

## 2022-09-16 NOTE — Progress Notes (Signed)
Patient recently had an MRI, CT@2000  not needed at this time per Dr. Milas Gain.

## 2022-09-16 NOTE — H&P (Signed)
Neurology H&P  CC: left sided weakness  History is obtained from:patient and chart  HPI: TKAI LARGE is a 52 y.o. female with history of hypertension not currently taking any medications who presents after she was at work at [employer] and suddenly began struggling for her words and developed sudden onset left-sided weakness.  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  She was transferred here for further care in the ICU.  Patient states that she had recently been in a large crowd and feels like she contracted a viral illness, she has been feeling under the weather with slight cough and dyspnea for about 3 days.  She has also had some nausea for the past several days.  Patient reports that she does not have much of a medical history but also says that she does not go to the doctor.  ICH score 1  LKW: 12:15 tpa given?: No, ICH IR Thrombectomy? No, ICH Modified Rankin Scale: 0-Completely asymptomatic and back to baseline post- stroke  NIHSS:  1a Level of Conscious.: 0 1b LOC Questions: 0 1c LOC Commands: 0 2 Best Gaze: 0 3 Visual: 0 4 Facial Palsy: 1 5a Motor Arm - left: 1 5b Motor Arm - Right: 0 6a Motor Leg - Left: 1 6b Motor Leg - Right: 0 7 Limb Ataxia: 0 8 Sensory: 1 9 Best Language: 0 10 Dysarthria: 1 11 Extinct and Inattention.: 0 TOTAL: 5      ROS: A complete ROS was performed and is negative except as noted in the HPI.   Past Medical History:  Diagnosis Date   Hypertension      Family History  Problem Relation Age of Onset   Osteoporosis Mother    High blood pressure Father    Heart Problems Father    Diabetes Father    Hypertension Father    Stroke Father    Heart attack Father    Depression Sister    Diabetes Sister    Hypertension Sister    Hypertension Maternal Aunt    Obesity Maternal Aunt    Hypertension Maternal Uncle    Kidney disease Maternal Uncle     Glaucoma Maternal Uncle    Stroke Paternal Uncle    Stroke Maternal Grandfather    Stroke Other      Social History:  reports that she has never smoked. She has never used smokeless tobacco. She reports current alcohol use of about 1.0 standard drink of alcohol per week. She reports that she does not use drugs.   Prior to Admission medications   Medication Sig Start Date End Date Taking? Authorizing Provider  amLODipine (NORVASC) 5 MG tablet Take 1 tablet (5 mg total) by mouth daily. 12/11/20   Birdie Sons, MD  metoprolol succinate (TOPROL-XL) 25 MG 24 hr tablet Take 1 tablet (25 mg total) by mouth daily. 11/27/20   Birdie Sons, MD     Exam: Current vital signs: BP (!) 142/77   Pulse (!) 110   Resp 16   SpO2 97%    Physical Exam  Constitutional: Appears well-developed and well-nourished.  Psych: Affect appropriate to situation Eyes: No scleral injection HENT: No OP obstrucion Head: Normocephalic.  Cardiovascular: Normal rate and regular rhythm.  Respiratory: Effort normal and breath sounds normal to anterior ascultation GI: Soft.  No distension. There is no tenderness.  Skin: WDI  Neuro: Mental Status: Patient is  awake, alert, oriented to person, place, month, year, and situation. Patient is able to give a clear and coherent history. No signs of aphasia or neglect but very mild dysarthria Cranial Nerves: II: Visual Fields are full. Pupils are equal, round, and reactive to light.   III,IV, VI: EOMI without ptosis or diplopia.  V: Facial sensation is diminished with tingling in left V2 and V3 VII: Face with mild left facial droop VIII: Hearing is intact to voice X: phonation intact XI: Shoulder shrug is weaker on the left XII: tongue is midline without atrophy or fasciculations.  Motor: Tone is normal. Bulk is normal. 5/5 strength present in right upper extremity and right lower extremity, 4 out of 5 strength in left upper extremity and left lower extremity.  Slowed movement of the left compared to the right Sensory: Sensation is symmetric to light touch in the arms and legs. No extinction to DSS present.  Cerebellar: FNF and HKS are intact bilaterally with some difficulty performing on the left   I have reviewed labs in epic and the pertinent results are:    Latest Ref Rng & Units 09/16/2022    1:56 PM 07/23/2021   11:44 AM 11/27/2020    4:23 PM  CBC  WBC 4.0 - 10.5 K/uL 10.3  10.2  7.8   Hemoglobin 12.0 - 15.0 g/dL 15.0  14.4  14.6   Hematocrit 36.0 - 46.0 % 44.7  42.1  44.1   Platelets 150 - 400 K/uL 293  321  305        Latest Ref Rng & Units 09/16/2022    1:56 PM 07/23/2021   11:44 AM 11/27/2020    4:23 PM  BMP  Glucose 70 - 99 mg/dL 131  103  95   BUN 6 - 20 mg/dL 15  12  14    Creatinine 0.44 - 1.00 mg/dL 0.82  0.69  0.92   BUN/Creat Ratio 9 - 23   15   Sodium 135 - 145 mmol/L 135  137  138   Potassium 3.5 - 5.1 mmol/L 3.6  3.9  4.1   Chloride 98 - 111 mmol/L 103  105  101   CO2 22 - 32 mmol/L 20  25  21    Calcium 8.9 - 10.3 mg/dL 8.9  9.2  9.3      I have reviewed the images obtained:  CT head: Right basal ganglia ICH extending over the corona radiata with IVH  CTA head/neck: 1. No evidence of a vascular malformation. 2. Intracranial atherosclerosis including moderate right P1 and mild bilateral P2 stenoses. 3. Widely patent carotid and vertebral arteries.  MRI brain: 1. No lesion or infarct seen underlying the right thalamic hematoma. No detected rebleeding and no hydrocephalus. 2. Areas of T2 hyperintensity with periventricular and pontine involvement, question symptoms of multiple sclerosis. Given the atheromatous changes by CTA this may instead reflect premature small vessel disease in this patient with hypertension.   Impression: Right basal ganglia ICH in patient with history of hypertension not taking any medications for this condition.  Given location, ICH is likely hypertensive in origin.  Will admit to ICU  and control blood pressure tightly with goal systolic blood pressure 259-563.  Will keep patient n.p.o. for now given IVH, as she may develop hydrocephalus and require neurosurgical intervention.  However at this time given her NIH improved from initial NIH of 9 at [facility] hospital to [place] of 4, she does not need hypertonic saline or emergent neurosurgical consultation.  Respiratory virus panel was sent due to history of cough and malaise.  Ondansetron given to control nausea.  Recommendations: -Admit to ICU -Stability MRI obtained, stable and neg for underlying mass/lesion -Hourly neurochecks with NIHSS -Keep n.p.o. for now -ondansetron for nausea, QTc 484 -Strict blood pressure control with Cleviprex, goal systolic blood pressure 130-150 -RVP panel for cough/cold sx, negative -Stroke team to follow in the morning  Pt seen by NP/Neuro and later by MD. Note/plan to be edited by MD as needed.  Cortney E Ernestina Columbia , MSN, AGACNP-BC [facility] See Amion for schedule and pager information 09/16/2022 4:17 PM  Attending Neurologist's note:  I personally saw this patient, gathering history, performing a full neurologic examination, reviewing relevant labs, personally reviewing relevant imaging including Head CT, CTA head/neck and MRI brain, and formulated the assessment and plan, adding the note above for completeness and clarity to accurately reflect my thoughts    CRITICAL CARE Performed by: Gordy Councilman  Total critical care time: 30 minutes  Critical care time was exclusive of separately billable procedures and treating other patients.  Critical care was necessary to treat or prevent imminent or life-threatening deterioration.  Critical care was time spent personally by me on the following activities: development of treatment plan with patient and/or surrogate as well as nursing, discussions with consultants, evaluation of patient's response to treatment, examination of  patient, obtaining history from patient or surrogate, ordering and performing treatments and interventions, ordering and review of laboratory studies, ordering and review of radiographic studies, pulse oximetry and re-evaluation of patient's condition.

## 2022-09-16 NOTE — ED Triage Notes (Signed)
Pt to ED via [facility] for CODE Stroke. Pt having left side weakness and slurred speech. Pt denies personal history of stroke but states that she has family hx. Pt was at work when symptoms started.

## 2022-09-16 NOTE — ED Notes (Signed)
Bladder scanned. 468 mL. Pt unable to urinate. Ellender Hose, MD made aware.

## 2022-09-16 NOTE — Consult Note (Signed)
NEUROLOGY CONSULTATION NOTE   Date of service: September 16, 2022 Patient Name: DARREL BARONI MRN:  [id number] DOB:  11/23/1970 Reason for consult: stroke code Requesting physician: Dr. Duffy Bruce _ _ _   _ __   _ __ _ _  __ __   _ __   __ _  History of Present Illness   This is a 52 year old woman with a past medical history significant for hypertension uncontrolled who presents with left-sided weakness.  Last known well was 12:15 PM when she was at work after which she developed acute onset of weakness of her left face arm and leg, as well as dysarthria.  Blood pressure was 210/110 with EMS.  NIH stroke scale was 9.  CT head personal review showed acute hemorrhage in the right basal ganglia extending over the corona radiata with intraventricular extension.  CTA head showed no evidence of vascular malformation.  TNK was not administered secondary to intracranial hemorrhage.  Patient was started on clevidipine for hypertensive emergency.  Patient is not on anticoagulation.   ROS   Per HPI: all other systems reviewed and are negative  Past History   I have reviewed the following:  Past Medical History:  Diagnosis Date   Hypertension    Past Surgical History:  Procedure Laterality Date   ABDOMINAL HYSTERECTOMY     CESAREAN SECTION     Family History  Problem Relation Age of Onset   Osteoporosis Mother    High blood pressure Father    Heart Problems Father    Diabetes Father    Hypertension Father    Stroke Father    Heart attack Father    Depression Sister    Diabetes Sister    Hypertension Sister    Hypertension Maternal Aunt    Obesity Maternal Aunt    Hypertension Maternal Uncle    Kidney disease Maternal Uncle    Glaucoma Maternal Uncle    Stroke Paternal Uncle    Stroke Maternal Grandfather    Stroke Other    Social History   Socioeconomic History   Marital status: Married    Spouse name: Not on file   Number of children: Not on file   Years of  education: Not on file   Highest education level: Not on file  Occupational History   Not on file  Tobacco Use   Smoking status: Never   Smokeless tobacco: Never  Vaping Use   Vaping Use: Never used  Substance and Sexual Activity   Alcohol use: Yes    Alcohol/week: 1.0 standard drink of alcohol    Types: 1 Glasses of wine per week   Drug use: No   Sexual activity: Not on file  Other Topics Concern   Not on file  Social History Narrative   3 children   Social Determinants of Health   Financial Resource Strain: Not on file  Food Insecurity: Not on file  Transportation Needs: Not on file  Physical Activity: Not on file  Stress: Not on file  Social Connections: Not on file   No Known Allergies  Medications   (Not in a hospital admission)     Current Facility-Administered Medications:    [START ON 09/17/2022]  stroke: early stages of recovery book, , Does not apply, Once, Derek Jack, MD   clevidipine (CLEVIPREX) infusion 0.5 mg/mL, 0-21 mg/hr, Intravenous, Continuous, Derek Jack, MD, Last Rate: 16 mL/hr at 09/16/22 1402, 8 mg/hr at 09/16/22 1402   sodium chloride flush (  NS) 0.9 % injection 3 mL, 3 mL, Intravenous, Once, Bhagat, Srishti L, MD  Current Outpatient Medications:    amLODipine (NORVASC) 5 MG tablet, Take 1 tablet (5 mg total) by mouth daily., Disp: 30 tablet, Rfl: 3   azithromycin (ZITHROMAX Z-PAK) 250 MG tablet, Take 2 tablets (500 mg) on  Day 1,  followed by 1 tablet (250 mg) once daily on Days 2 through 5., Disp: 6 each, Rfl: 0   chlorpheniramine-HYDROcodone (TUSSIONEX PENNKINETIC ER) 10-8 MG/5ML SUER, Take 5 mLs by mouth every 12 (twelve) hours as needed for cough., Disp: 70 mL, Rfl: 0   metoprolol succinate (TOPROL-XL) 25 MG 24 hr tablet, Take 1 tablet (25 mg total) by mouth daily., Disp: 90 tablet, Rfl: 3   triamcinolone ointment (KENALOG) 0.5 %, Apply 1 application topically 2 (two) times daily., Disp: 30 g, Rfl: 0  Vitals   Vitals:    09/16/22 1352 09/16/22 1400 09/16/22 1412  BP: (!) 214/108 (!) 170/124   Pulse:  (!) 106   Resp:  17   SpO2:  100%   Weight:   92.4 kg     Body mass index is 34.97 kg/m.  Physical Exam   Physical Exam Gen: A&O x4, NAD HEENT: Atraumatic, normocephalic;mucous membranes moist; oropharynx clear, tongue without atrophy or fasciculations. Neck: Supple, trachea midline. Resp: CTAB, no w/r/r CV: RRR, no m/g/r; nml S1 and S2. 2+ symmetric peripheral pulses. Abd: soft/NT/ND; nabs x 4 quad Extrem: Nml bulk; no cyanosis, clubbing, or edema.  Neuro: *MS: A&O x4. Follows multi-step commands.  *Speech: fluid, mild dysarthria, able to name and repeat *CN:    I: Deferred   II,III: PERRLA, VFF by confrontation, optic discs unable to be visualized 2/2 pupillary constriction   III,IV,VI: EOMI w/o nystagmus, no ptosis   V: Sensation intact from V1 to V3 to LT   VII: Eyelid closure was full.  L UMN facial droop   VIII: Hearing intact to voice   IX,X: Voice normal, palate elevates symmetrically    XI: SCM/trap 5/5 bilat   XII: Tongue protrudes midline, no atrophy or fasciculations   *Motor:   Normal bulk.  No tremor, rigidity or bradykinesia. RUE and RLE full strength. LUE can only wiggle fingers, LLE can only wiggle toes. *Sensory: LUE numbness. No double-simultaneous extinction.  *Coordination: FNF intact on R *Reflexes:  2+ and symmetric throughout without clonus; toes down-going bilat *Gait: deferred  NIHSS  1a Level of Conscious.: 0 1b LOC Questions: 0 1c LOC Commands: 0 2 Best Gaze: 0 3 Visual: 0 4 Facial Palsy: 1 5a Motor Arm - left: 3 5b Motor Arm - Right: 0 6a Motor Leg - Left: 3 6b Motor Leg - Right: 0 7 Limb Ataxia: 0 8 Sensory: 1 9 Best Language: 0 10 Dysarthria: 1 11 Extinct. and Inatten.: 0  TOTAL: 9   Premorbid mRS = 0   Labs   CBC:  Recent Labs  Lab 09/16/22 1356  WBC 10.3  NEUTROABS 7.0  HGB 15.0  HCT 44.7  MCV 83.2  PLT 293    Basic Metabolic  Panel:  Lab Results  Component Value Date   NA 137 07/23/2021   K 3.9 07/23/2021   CO2 25 07/23/2021   GLUCOSE 103 (H) 07/23/2021   BUN 12 07/23/2021   CREATININE 0.69 07/23/2021   CALCIUM 9.2 07/23/2021   GFRNONAA >60 07/23/2021   GFRAA >60 07/21/2015   Lipid Panel: No results found for: "LDLCALC" HgbA1c: No results found for: "HGBA1C" Urine Drug  Screen: No results found for: "LABOPIA", "COCAINSCRNUR", "LABBENZ", "AMPHETMU", "THCU", "LABBARB"  Alcohol Level No results found for: "ETH"   Impression   This is a 52 year old woman with a past medical history significant for hypertension uncontrolled who presents with left-sided weakness and was found to have a R BG hemorrhage with intraventricular extension. Etiology is hypertensive.  Recommendations   - Admit to 4N neuro ICU under Dr. Lesleigh Noe. Bed available. [facility] will call ED when bed assigned. Patient will need to be transported via EMS.  - Clevidipine for goal SBP <150 - SCDs for DVT prophylaxis - Head CT in 6 hrs assess stability of ICH - HOB elevated 30 degrees  D/w Dr. Ellender Hose and Dr. Curly [place]   This patient is critically ill and at significant risk of neurological worsening, death and care requires constant monitoring of vital signs, hemodynamics,respiratory and cardiac monitoring, neurological assessment, discussion with family, other specialists and medical decision making of high complexity. I spent 70 minutes of neurocritical care time  in the care of  this patient. This was time spent independent of any time provided by nurse practitioner or PA.   Su Monks, MD [facility] [phone]   If 7pm- 7am, please page neurology on call as listed in [place].

## 2022-09-17 ENCOUNTER — Inpatient Hospital Stay (HOSPITAL_COMMUNITY): Payer: BC Managed Care – PPO

## 2022-09-17 DIAGNOSIS — I6389 Other cerebral infarction: Secondary | ICD-10-CM

## 2022-09-17 DIAGNOSIS — I161 Hypertensive emergency: Secondary | ICD-10-CM

## 2022-09-17 DIAGNOSIS — I610 Nontraumatic intracerebral hemorrhage in hemisphere, subcortical: Secondary | ICD-10-CM | POA: Diagnosis not present

## 2022-09-17 LAB — ECHOCARDIOGRAM COMPLETE
AR max vel: 2.42 cm2
AV Area VTI: 2.42 cm2
AV Area mean vel: 2.38 cm2
AV Mean grad: 4 mmHg
AV Peak grad: 7.7 mmHg
Ao pk vel: 1.39 m/s
Area-P 1/2: 2.77 cm2
S' Lateral: 2.9 cm
Weight: 3259.28 oz

## 2022-09-17 LAB — CBC
HCT: 41.8 % (ref 36.0–46.0)
Hemoglobin: 14.8 g/dL (ref 12.0–15.0)
MCH: 28.5 pg (ref 26.0–34.0)
MCHC: 35.4 g/dL (ref 30.0–36.0)
MCV: 80.4 fL (ref 80.0–100.0)
Platelets: 274 10*3/uL (ref 150–400)
RBC: 5.2 MIL/uL — ABNORMAL HIGH (ref 3.87–5.11)
RDW: 13 % (ref 11.5–15.5)
WBC: 13.2 10*3/uL — ABNORMAL HIGH (ref 4.0–10.5)
nRBC: 0 % (ref 0.0–0.2)

## 2022-09-17 LAB — BASIC METABOLIC PANEL
Anion gap: 10 (ref 5–15)
BUN: 13 mg/dL (ref 6–20)
CO2: 24 mmol/L (ref 22–32)
Calcium: 9 mg/dL (ref 8.9–10.3)
Chloride: 104 mmol/L (ref 98–111)
Creatinine, Ser: 0.87 mg/dL (ref 0.44–1.00)
GFR, Estimated: >60 mL/min (ref >60)
Glucose, Bld: 117 mg/dL — ABNORMAL HIGH (ref 70–99)
Potassium: 3.8 mmol/L (ref 3.5–5.1)
Sodium: 138 mmol/L (ref 135–145)

## 2022-09-17 MED ORDER — LOSARTAN POTASSIUM 50 MG PO TABS
100.0000 mg | ORAL_TABLET | Freq: Every day | ORAL | Status: DC
  Administered 2022-09-18 – 2022-09-20 (×2): 100 mg via ORAL
  Filled 2022-09-17 (×3): qty 2

## 2022-09-17 MED ORDER — METOPROLOL SUCCINATE ER 25 MG PO TB24
25.0000 mg | ORAL_TABLET | Freq: Every day | ORAL | Status: DC
  Administered 2022-09-17: 25 mg via ORAL
  Filled 2022-09-17 (×2): qty 1

## 2022-09-17 MED ORDER — PANTOPRAZOLE SODIUM 40 MG PO TBEC
40.0000 mg | DELAYED_RELEASE_TABLET | Freq: Every day | ORAL | Status: DC
  Administered 2022-09-17 – 2022-09-20 (×3): 40 mg via ORAL
  Filled 2022-09-17 (×4): qty 1

## 2022-09-17 MED ORDER — METOPROLOL SUCCINATE ER 25 MG PO TB24
25.0000 mg | ORAL_TABLET | Freq: Once | ORAL | Status: AC
  Administered 2022-09-17: 25 mg via ORAL
  Filled 2022-09-17: qty 1

## 2022-09-17 MED ORDER — METOPROLOL SUCCINATE ER 50 MG PO TB24
50.0000 mg | ORAL_TABLET | Freq: Every day | ORAL | Status: DC
  Administered 2022-09-18: 50 mg via ORAL
  Filled 2022-09-17: qty 1

## 2022-09-17 MED ORDER — ROSUVASTATIN CALCIUM 20 MG PO TABS
40.0000 mg | ORAL_TABLET | Freq: Every day | ORAL | Status: DC
  Administered 2022-09-18 – 2022-09-20 (×2): 40 mg via ORAL
  Filled 2022-09-17 (×3): qty 2

## 2022-09-17 MED ORDER — LOSARTAN POTASSIUM 50 MG PO TABS: 50.0000 mg | ORAL_TABLET | Freq: Once | ORAL | Status: DC

## 2022-09-17 MED ORDER — HEPARIN SODIUM (PORCINE) 5000 UNIT/ML IJ SOLN
5000.0000 [IU] | Freq: Three times a day (TID) | INTRAMUSCULAR | Status: DC
  Administered 2022-09-18 – 2022-09-20 (×7): 5000 [IU] via SUBCUTANEOUS
  Filled 2022-09-17 (×8): qty 1

## 2022-09-17 MED ORDER — AMLODIPINE BESYLATE 10 MG PO TABS
10.0000 mg | ORAL_TABLET | Freq: Every day | ORAL | Status: DC
  Administered 2022-09-17 – 2022-09-20 (×3): 10 mg via ORAL
  Filled 2022-09-17 (×4): qty 1

## 2022-09-17 NOTE — Progress Notes (Signed)
  Echocardiogram 2D Echocardiogram has been performed.  Kelly Stephenson 09/17/2022, 10:49 AM

## 2022-09-17 NOTE — Progress Notes (Signed)
Inpatient Rehab Admissions Coordinator:  ? ?Per therapy recommendations,  patient was screened for CIR candidacy by Sargun Rummell, MS, CCC-SLP. At this time, Pt. Appears to be a a potential candidate for CIR. I will place   order for rehab consult per protocol for full assessment. Please contact me any with questions. ? ?Merridy Pascoe, MS, CCC-SLP ?Rehab Admissions Coordinator  ?[phone] (celll) ?[phone] (office) ? ?

## 2022-09-17 NOTE — Evaluation (Signed)
Physical Therapy Evaluation Patient Details Name: Kelly Stephenson MRN: [id number] DOB: 1971/03/17 Today's Date: 09/17/2022  History of Present Illness  Kelly Stephenson is a 52 y.o. female brought to [facility] due to sudden onset of left-sided weakness and word finding difficulties. 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 PMH: HTN not on medication, anxiety   Clinical Impression  Pt admitted with above. Pt with noted impaired strength, co-ordination, and sensation in L UE and LE requiring maxAx2 for OOB mobility. Despite sleepiness pt orientedx4 and gave great effort. PT gave pt and spouse quad sets and glut sets for HEP to promote L LE strengthening. Pt was able to kick L LE out at EOB by end of session however unable to bear weight on it without buckling requiring PT to block L knee when in standing and during transfer to chair. At this time recommending AIR Upon d/c for maximal functional recovery as pt was indep and working PTA. Acute PT to cont to follow.       Recommendations for follow up therapy are one component of a multi-disciplinary discharge planning process, led by the attending physician.  Recommendations may be updated based on patient status, additional functional criteria and insurance authorization.  Follow Up Recommendations Acute inpatient rehab (3hours/day)      Assistance Recommended at Discharge Frequent or constant Supervision/Assistance  Patient can return home with the following  A lot of help with walking and/or transfers;A lot of help with bathing/dressing/bathroom;Assist for transportation;Help with stairs or ramp for entrance;Assistance with cooking/housework    Equipment Recommendations Other (comment) (TBD at next venue)  Recommendations for Other Services  Rehab consult    Functional Status Assessment Patient has had a recent decline in their functional status and demonstrates the ability to make significant improvements in function in a  reasonable and predictable amount of time.     Precautions / Restrictions Precautions Precautions: Fall Restrictions Weight Bearing Restrictions: No      Mobility  Bed Mobility Overal bed mobility: Needs Assistance Bed Mobility: Supine to Sit     Supine to sit: Mod assist, HOB elevated, +2 for physical assistance     General bed mobility comments: pt with good effort to try and assist with R UE, pt pulled self up on PT while RN tech assist with bringing hips to EOB using bed pad, limited functional use from L UE and LE    Transfers Overall transfer level: Needs assistance Equipment used: 2 person hand held assist (face to face transfer with use of gait belt) Transfers: Sit to/from Stand, Bed to chair/wheelchair/BSC Sit to Stand: Mod assist, +2 physical assistance   Step pivot transfers: Max assist, +2 physical assistance       General transfer comment: pt unable to use L UE to assist with transfer, L knee blocked due to buckling, maxA to advance L LE during transfer to chair    Ambulation/Gait               General Gait Details: unable this date  Stairs            Wheelchair Mobility    Modified Rankin (Stroke Patients Only) Modified Rankin (Stroke Patients Only) Pre-Morbid Rankin Score: No symptoms Modified Rankin: Severe disability     Balance Overall balance assessment: Needs assistance Sitting-balance support: Feet supported, Single extremity supported Sitting balance-Leahy Scale: Poor Sitting balance - Comments: when focused on it pt able to maintain balance with close min guard however  due to lethargy pt with fall to the L requiring verbal cues to self correct Postural control: Left lateral lean Standing balance support: During functional activity, Single extremity supported Standing balance-Leahy Scale: Zero Standing balance comment: dependent on PT and PT tech                             Pertinent Vitals/Pain Pain  Assessment Pain Assessment: Faces Faces Pain Scale: Hurts little more Pain Location: L hand at IV infiltrate site Pain Descriptors / Indicators: Discomfort Pain Intervention(s): Monitored during session    Home Living Family/patient expects to be discharged to:: Private residence Living Arrangements: Spouse/significant other;Children (17yo son who is home schooled) Available Help at Discharge: Family;Available 24 hours/day (spouse works but son can be there) Type of Home: House Home Access: Stairs to enter Entrance Stairs-Rails: None Entrance Stairs-Number of Steps: 3   Home Layout: Able to live on main level with bedroom/bathroom Home Equipment: None      Prior Function Prior Level of Function : Independent/Modified Independent;Driving;Working/employed             Mobility Comments: indep ADLs Comments: indep     Hand Dominance   Dominant Hand: Right    Extremity/Trunk Assessment   Upper Extremity Assessment Upper Extremity Assessment: LUE deficits/detail LUE Deficits / Details: grossly 3-/5, impaired sensation LUE Sensation: decreased light touch;decreased proprioception    Lower Extremity Assessment Lower Extremity Assessment: LLE deficits/detail LLE Deficits / Details: grossly 3/5, DF 1/5 LLE Sensation: decreased light touch    Cervical / Trunk Assessment Cervical / Trunk Assessment: Normal  Communication   Communication: Expressive difficulties (soft spoken and word finding difficulties)  Cognition Arousal/Alertness: Lethargic (but opens eyes and is responsive, more sleepy than lethargic) Behavior During Therapy: Flat affect Overall Cognitive Status: Within Functional Limits for tasks assessed                                 General Comments: pt able to follow all commands, pt with delayed response time but is sleepy, pt orientedx4        General Comments General comments (skin integrity, edema, etc.): VSS, Pt's SPO2 >92% on RA t/o  session    Exercises     Assessment/Plan    PT Assessment    PT Problem List         PT Treatment Interventions      PT Goals (Current goals can be found in the Care Plan section)  Acute Rehab PT Goals Patient Stated Goal: get better PT Goal Formulation: With patient/family Time For Goal Achievement: 10/01/22 Potential to Achieve Goals: Good    Frequency       Co-evaluation               AM-PAC PT "6 Clicks" Mobility  Outcome Measure Help needed turning from your back to your side while in a flat bed without using bedrails?: A Lot Help needed moving from lying on your back to sitting on the side of a flat bed without using bedrails?: A Lot Help needed moving to and from a bed to a chair (including a wheelchair)?: A Lot Help needed standing up from a chair using your arms (e.g., wheelchair or bedside chair)?: Total Help needed to walk in hospital room?: Total Help needed climbing 3-5 steps with a railing? : Total 6 Click Score: 9    End of  Session Equipment Utilized During Treatment: Gait belt Activity Tolerance: Patient limited by lethargy (pt sleepy) Patient left: in chair;with call bell/phone within reach;with chair alarm set;with family/visitor present (spouse) Nurse Communication: Mobility status (use stedy to transfer pt back or turn chair around to complete std pvt to R side) PT Visit Diagnosis: Muscle weakness (generalized) (M62.81);Difficulty in walking, not elsewhere classified (R26.2)    Time: [id number] PT Time Calculation (min) (ACUTE ONLY): 26 min   Charges:   PT Evaluation $PT Eval Moderate Complexity: 1 Mod PT Treatments $Therapeutic Activity: 8-22 mins        Kittie Plater, PT, DPT Acute Rehabilitation Services Secure chat preferred Office #: [phone]   Berline Lopes 09/17/2022, 1:54 PM

## 2022-09-17 NOTE — Progress Notes (Signed)
STROKE TEAM PROGRESS NOTE   SUBJECTIVE (INTERVAL HISTORY) Her RN is at the bedside.  Overall her condition is stable. Pt is lethargic but AAO x 3, still has left hemiparesis, but passed swallow, on diet, will add BP po meds.     OBJECTIVE Temp:  [97.4 F (36.3 C)-99.1 F (37.3 C)] 98.4 F (36.9 C) (01/06 0800) Pulse Rate:  [79-197] 93 (01/06 0945) Resp:  [2-34] 22 (01/06 0945) BP: (95-214)/(53-124) (P) 157/71 (01/06 0945) SpO2:  [90 %-100 %] 99 % (01/06 0945) Weight:  [92.4 kg] 92.4 kg (01/05 1412)  Recent Labs  Lab 09/16/22 1333  GLUCAP 127*   Recent Labs  Lab 09/16/22 1356 09/17/22 0454  NA 135 138  K 3.6 3.8  CL 103 104  CO2 20* 24  GLUCOSE 131* 117*  BUN 15 13  CREATININE 0.82 0.87  CALCIUM 8.9 9.0   Recent Labs  Lab 09/16/22 1356  AST 24  ALT 20  ALKPHOS 124  BILITOT 0.9  PROT 7.4  ALBUMIN 3.9   Recent Labs  Lab 09/16/22 1356 09/17/22 0454  WBC 10.3 13.2*  NEUTROABS 7.0  --   HGB 15.0 14.8  HCT 44.7 41.8  MCV 83.2 80.4  PLT 293 274   No results for input(s): "CKTOTAL", "CKMB", "CKMBINDEX", "TROPONINI" in the last 168 hours. Recent Labs    09/16/22 1356 09/16/22 2007 09/16/22 2142  LABPROT 13.3 12.8 13.4  INR 1.0 1.0 1.0   No results for input(s): "COLORURINE", "LABSPEC", "PHURINE", "GLUCOSEU", "HGBUR", "BILIRUBINUR", "KETONESUR", "PROTEINUR", "UROBILINOGEN", "NITRITE", "LEUKOCYTESUR" in the last 72 hours.  Invalid input(s): "APPERANCEUR"     Component Value Date/Time   CHOL 310 (H) 09/16/2022 1630   TRIG 225 (H) 09/16/2022 1630   HDL 59 09/16/2022 1630   CHOLHDL 5.3 09/16/2022 1630   VLDL 45 (H) 09/16/2022 1630   LDLCALC 206 (H) 09/16/2022 1630   No results found for: "HGBA1C"    Component Value Date/Time   LABOPIA NONE DETECTED 09/16/2022 1630   COCAINSCRNUR NONE DETECTED 09/16/2022 1630   LABBENZ NONE DETECTED 09/16/2022 1630   AMPHETMU NONE DETECTED 09/16/2022 1630   THCU NONE DETECTED 09/16/2022 1630   LABBARB NONE DETECTED  09/16/2022 1630    No results for input(s): "ETH" in the last 168 hours.  I have personally reviewed the radiological images below and agree with the radiology interpretations.  CT HEAD WO CONTRAST ( )  Result Date: 09/16/2022 CLINICAL DATA:  Headache, sudden, severe Hydrocephalus EXAM: CT HEAD WITHOUT CONTRAST TECHNIQUE: Contiguous axial images were obtained from the base of the skull through the vertex without intravenous contrast. RADIATION DOSE REDUCTION: This exam was performed according to the departmental dose-optimization program which includes automated exposure control, adjustment of the mA and/or kV according to patient size and/or use of iterative reconstruction technique. COMPARISON:  1:28 p.m. FINDINGS: Brain: 12 x 17 x 20 mm (volume = 2.1 mL) hematoma within the right caudate body/superior thalamus is stable with extension of hemorrhage into the right lateral ventricle, third and fourth ventricle again noted. Ventricular size is normal and stable. No significant mass effect or midline shift. No acute infarct. Cerebellum is unremarkable. Vascular: No hyperdense vessel or unexpected calcification. Skull: Normal. Negative for fracture or focal lesion. Sinuses/Orbits: Visualized orbits are unremarkable. Paranasal sinuses are clear. Other: Mastoid air cells and middle ear cavities are clear. IMPRESSION: 1. Stable right caudate body/superior thalamus hematoma with extension of hemorrhage into the right lateral ventricle, third and fourth ventricle. Ventricular size is normal and stable. No  significant mass effect or midline shift. 2. No acute infarct. Electronically Signed   By: Helyn Numbers M.D.   On: 09/16/2022 21:44   MR BRAIN W WO CONTRAST  Result Date: 09/16/2022 CLINICAL DATA:  Nonhemorrhagic stroke. EXAM: MRI HEAD WITHOUT AND WITH CONTRAST TECHNIQUE: Multiplanar, multiecho pulse sequences of the brain and surrounding structures were obtained without and with intravenous contrast.  CONTRAST:  9.59mL GADAVIST GADOBUTROL 1 MMOL/ML IV SOLN COMPARISON:  Head CT and CTA from earlier today FINDINGS: Brain: Known acute hematoma in the right thalamus and adjacent white matter with superior extension into the caudate body and right lateral ventricle. Blood clot reaches the fourth ventricle, no detected change from earlier CT. Small area of shine through in the left periventricular white matter, other areas of mild diffusion hyperintensity are related to susceptibility artifact from intraventricular clot. White matter disease with notable periventricular ovoid pattern at the left lateral ventricle and hazy T2 hyperintensity in the right pons, demyelinating disease is considered. No abnormal enhancement at the level of the hemorrhage. No masslike finding throughout the brain. Vascular: Major flow voids and vascular enhancements are preserved. There was preceding CTA. Skull and upper cervical spine: Normal marrow signal. Sinuses/Orbits: Negative. IMPRESSION: 1. No lesion or infarct seen underlying the right thalamic hematoma. No detected rebleeding and no hydrocephalus. 2. Areas of T2 hyperintensity with periventricular and pontine involvement, question symptoms of multiple sclerosis. Given the atheromatous changes by CTA this may instead reflect premature small vessel disease in this patient with hypertension. Electronically Signed   By: Tiburcio Pea M.D.   On: 09/16/2022 19:03   CT ANGIO HEAD NECK W WO CM  Result Date: 09/16/2022 CLINICAL DATA:  Neuro deficit, acute, stroke suspected. Right basal ganglia hemorrhage. Rule out AVM. EXAM: CT ANGIOGRAPHY HEAD AND NECK TECHNIQUE: Multidetector CT imaging of the head and neck was performed using the standard protocol during bolus administration of intravenous contrast. Multiplanar CT image reconstructions and MIPs were obtained to evaluate the vascular anatomy. Carotid stenosis measurements (when applicable) are obtained utilizing NASCET criteria, using  the distal internal carotid diameter as the denominator. RADIATION DOSE REDUCTION: This exam was performed according to the departmental dose-optimization program which includes automated exposure control, adjustment of the mA and/or kV according to patient size and/or use of iterative reconstruction technique. CONTRAST:  69mL OMNIPAQUE IOHEXOL 350 MG/ML SOLN COMPARISON:  None Available. FINDINGS: CTA NECK FINDINGS Aortic arch: Standard 3 vessel aortic arch. Wide patency of the brachiocephalic and subclavian arteries. Right carotid system: Patent without evidence of stenosis or dissection. Left carotid system: Patent with mild atheromatous wall thickening of the mid common carotid artery. No evidence of a significant stenosis or dissection. Vertebral arteries: Patent without evidence of stenosis or dissection. Strongly dominant left vertebral artery. Skeleton: Mild cervical spondylosis. Other neck: No evidence of cervical lymphadenopathy or mass. Upper chest: Clear lung apices. Review of the MIP images confirms the above findings CTA HEAD FINDINGS Anterior circulation: The internal carotid arteries are patent from skull base to carotid termini with mild atherosclerotic irregularity but no significant stenosis. ACAs and MCAs are patent with mild branch vessel irregularity but no evidence of a proximal branch occlusion or significant proximal stenosis. No aneurysm or vascular malformation is identified, and there is no spot sign within the right basal ganglia hemorrhage. Posterior circulation: The intracranial vertebral arteries are widely patent to the basilar. Patent PICA, AICA, and SCA origins are seen bilaterally. The basilar artery is patent with mild atherosclerotic irregularity but no significant  stenosis. There is a moderate-sized right posterior communicating artery. Moderate right P1 and mild bilateral P2 stenoses are present. No aneurysm is identified. Venous sinuses: Poorly evaluated due to arterial  contrast timing. Anatomic variants: None. Review of the MIP images confirms the above findings IMPRESSION: 1. No evidence of a vascular malformation. 2. Intracranial atherosclerosis including moderate right P1 and mild bilateral P2 stenoses. 3. Widely patent carotid and vertebral arteries. Electronically Signed   By: Sebastian Ache M.D.   On: 09/16/2022 14:17   CT HEAD CODE STROKE WO CONTRAST  Result Date: 09/16/2022 CLINICAL DATA:  Code stroke.  Neuro deficit, acute, stroke suspected EXAM: CT HEAD WITHOUT CONTRAST TECHNIQUE: Contiguous axial images were obtained from the base of the skull through the vertex without intravenous contrast. RADIATION DOSE REDUCTION: This exam was performed according to the departmental dose-optimization program which includes automated exposure control, adjustment of the mA and/or kV according to patient size and/or use of iterative reconstruction technique. COMPARISON:  None Available. FINDINGS: Brain: Approximately 1.6 x 1.4 x 2.0 cm acute hemorrhage (estimated volume of 2.2 mL) in the right basal ganglia extending and overlying corona radiata. Intraventricular extension of hemorrhage into the right lateral ventricle, third ventricle, and fourth ventricle. No hydrocephalus at this time. No midline shift. No evidence of acute large vascular territory infarct. Vascular: No hyperdense vessel identified. Skull: No acute fracture. Sinuses/Orbits: Mild right sphenoid sinus mucosal thickening. Other: No mastoid effusions. ASPECTS [facility] Stroke Program Early CT Score) IMPRESSION: Acute hemorrhage in the right basal ganglia extend overlying corona radiata, described above. Intraventricular extension of hemorrhage. Findings discussed with Dr. Selina Cooley at 1:34 PM via telephone. Electronically Signed   By: Feliberto Harts M.D.   On: 09/16/2022 13:35     PHYSICAL EXAM  Temp:  [97.4 F (36.3 C)-99.1 F (37.3 C)] 98.4 F (36.9 C) (01/06 0800) Pulse Rate:  [79-197] 93 (01/06 0945) Resp:   [2-34] 22 (01/06 0945) BP: (95-214)/(53-124) (P) 157/71 (01/06 0945) SpO2:  [90 %-100 %] 99 % (01/06 0945) Weight:  [92.4 kg] 92.4 kg (01/05 1412)  General - Well nourished, well developed, in no apparent distress, mildly lethargic.  Ophthalmologic - fundi not visualized due to noncooperation.  Cardiovascular - Regular rhythm and rate.  Mental Status -  Level of arousal and orientation to time, place, and person were intact. Language including expression, naming, repetition, comprehension was assessed and found intact.  Cranial Nerves II - XII - II - Visual field intact OU. III, IV, VI - Extraocular movements intact. V - Facial sensation decreased on the left VII - left mild facial droop VIII - Hearing & vestibular intact bilaterally. X - Palate elevates symmetrically. XI - Chin turning & shoulder shrug intact bilaterally. XII - Tongue protrusion intact.  Motor Strength - The patient's strength was normal in RUE and RLE, but LUE 3/5 and LLE 3/5 proximal and 2/5 distally.  Bulk was normal and fasciculations were absent.   Motor Tone - Muscle tone was assessed at the neck and appendages and was normal.  Reflexes - The patient's reflexes were symmetrical in all extremities and she had no pathological reflexes.  Sensory - Light touch, temperature/pinprick were assessed and were decreased on the left UE.    Coordination - The patient had normal movements in the hand with no ataxia or dysmetria although very slow on the left.  Tremor was absent.  Gait and Station - deferred.   ASSESSMENT/PLAN Ms. Kelly Stephenson is a 52 y.o. female with history of  HTN admitted for left sided weakness and speech difficulty. No tPA given due to [place].    ICH:  right BG ICH and IVH secondary to uncontrolled hypertension CT head right BG small ICH/IVH CTA head and neck right P1 moderate stenosis, no AVM or aneurysm MRI  stable right BG ICH and IVH 2D Echo  EF 60-65% LDL 206 HgbA1c pending UDS  neg Heparin subq for VTE prophylaxis No antithrombotic prior to admission, now on No antithrombotic due to [place] Ongoing aggressive stroke risk factor management Therapy recommendations:  CIR Disposition:  pending  Hypertensive emergency Unstable On cleviprex BP goal < 160 On amlodipine and metoprolol XL Add cozaar Long term BP goal normotensive  Hyperlipidemia Home meds:  none  LDL 206, goal < 70 Now on crestor 40 Continue statin at discharge  Other Stroke Risk Factors Advanced age  Other [facility] day # 1  This patient is critically ill due to right BG ICH and IVH, hypertensive emergency and at significant risk of neurological worsening, death form hematoma expansion, hydrocephalus, brain herniation. This patient's care requires constant monitoring of vital signs, hemodynamics, respiratory and cardiac monitoring, review of multiple databases, neurological assessment, discussion with family, other specialists and medical decision making of high complexity. I spent 35 minutes of neurocritical care time in the care of this patient.    Rosalin Hawking, MD PhD Stroke Neurology 09/17/2022 9:53 AM    To contact Stroke Continuity provider, please refer to [email or web address] After hours, contact General Neurology

## 2022-09-18 DIAGNOSIS — I161 Hypertensive emergency: Secondary | ICD-10-CM | POA: Diagnosis not present

## 2022-09-18 DIAGNOSIS — I610 Nontraumatic intracerebral hemorrhage in hemisphere, subcortical: Secondary | ICD-10-CM | POA: Diagnosis not present

## 2022-09-18 LAB — BASIC METABOLIC PANEL
Anion gap: 11 (ref 5–15)
BUN: 12 mg/dL (ref 6–20)
CO2: 25 mmol/L (ref 22–32)
Calcium: 8.9 mg/dL (ref 8.9–10.3)
Chloride: 98 mmol/L (ref 98–111)
Creatinine, Ser: 0.79 mg/dL (ref 0.44–1.00)
GFR, Estimated: >60 mL/min (ref >60)
Glucose, Bld: 120 mg/dL — ABNORMAL HIGH (ref 70–99)
Potassium: 3.5 mmol/L (ref 3.5–5.1)
Sodium: 134 mmol/L — ABNORMAL LOW (ref 135–145)

## 2022-09-18 LAB — CBC
HCT: 42.6 % (ref 36.0–46.0)
Hemoglobin: 14.8 g/dL (ref 12.0–15.0)
MCH: 28.4 pg (ref 26.0–34.0)
MCHC: 34.7 g/dL (ref 30.0–36.0)
MCV: 81.8 fL (ref 80.0–100.0)
Platelets: 272 10*3/uL (ref 150–400)
RBC: 5.21 MIL/uL — ABNORMAL HIGH (ref 3.87–5.11)
RDW: 13 % (ref 11.5–15.5)
WBC: 13.5 10*3/uL — ABNORMAL HIGH (ref 4.0–10.5)
nRBC: 0 % (ref 0.0–0.2)

## 2022-09-18 MED ORDER — HYDRALAZINE HCL 20 MG/ML IJ SOLN
10.0000 mg | INTRAMUSCULAR | Status: DC | PRN
  Administered 2022-09-18: 10 mg via INTRAVENOUS
  Filled 2022-09-18: qty 1

## 2022-09-18 MED ORDER — LABETALOL HCL 5 MG/ML IV SOLN
10.0000 mg | INTRAVENOUS | Status: DC | PRN
  Administered 2022-09-18 – 2022-09-19 (×3): 20 mg via INTRAVENOUS
  Filled 2022-09-18 (×3): qty 4

## 2022-09-18 MED ORDER — METOPROLOL TARTRATE 50 MG PO TABS
50.0000 mg | ORAL_TABLET | Freq: Two times a day (BID) | ORAL | Status: DC
  Administered 2022-09-18 – 2022-09-20 (×3): 50 mg via ORAL
  Filled 2022-09-18 (×4): qty 1

## 2022-09-18 MED ORDER — HYDRALAZINE HCL 50 MG PO TABS
50.0000 mg | ORAL_TABLET | Freq: Three times a day (TID) | ORAL | Status: DC
  Administered 2022-09-18 – 2022-09-19 (×3): 50 mg via ORAL
  Filled 2022-09-18 (×3): qty 1

## 2022-09-18 NOTE — Progress Notes (Addendum)
Pt throughout the shift continues to fluctuate on SPO2 while on RA; readings down to 86% but not sustaining longer than approximately 30 sec. Pt has stated she does not want to keep the O2 [place] on bc it is uncomfortable and dries out her nose. Offered to use a humidifer with the [place] but pt requested to still leave it off. [place] assessment remains the same throughout the shift as was at the start.  04: pt with c/o feeling congested, dry nasal passage and restless in the bed. Offered pt humidified O2 again to which pt agreed to try.

## 2022-09-18 NOTE — PMR Pre-admission (Signed)
PMR Admission Coordinator Pre-Admission Assessment  Patient: Kelly Stephenson is an 52 y.o., female MRN: [id number] DOB: 09/07/1971 Height:   Weight:    Insurance Information HMO: ***    PPO: ***     PCP: ***     IPA: ***     80/20: ***     OTHER: *** PRIMARY: BCBS Commercial      Policy#: ***      Subscriber: *** CM Name: ***      Phone#: ***     Fax#: *** Pre-Cert#: ***      Employer: *** Benefits:  Phone #: ***     Name: *** Eff. Date: ***     Deduct: ***      Out of Pocket Max: ***      Life Max: *** CIR: ***      SNF: *** Outpatient: ***     Co-Pay: *** Home Health: ***      Co-Pay: *** DME: ***     Co-Pay: *** Providers: *** SECONDARY: ***      Policy#: ***     Phone#: ***  Financial Counselor: ***      Phone#: ***  The "Data Collection Information Summary" for patients in Inpatient Rehabilitation Facilities with attached "Privacy Act Statement-Health Care Records" was provided and verbally reviewed with: Patient  Emergency Contact Information Contact Information     Name Relation Home Work Mobile   Vallejo,George Spouse [phone]  [phone]       Current Medical History  Patient Admitting Diagnosis: ICH History of Present Illness: Kelly Stephenson is a 52 y.o. female with history of hypertension not currently taking any medications who presented to [facility] ED 09/16/22  after she was at work and suddenly began struggling for her words and developed sudden onset left-sided weakness.  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  She was transferred to [facility] ED  for further care in the ICU. 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 2D Echo with  EF 60-65%. UDS negative. ICH felt due to hypertensive emergency. Pt. With unstable BP, given cleviprex in the  ICU. She was seen by PT/OT/SLP and they recommend CIR to assist return to PLOF.   Complete NIHSS TOTAL: 5  Patient's medical record from [facility]  has been reviewed by the rehabilitation admission coordinator and physician.  Past Medical History  Past Medical History:  Diagnosis Date   Hypertension     Has the patient had major surgery during 100 days prior to admission? No  Family History   family history includes Depression in her sister; Diabetes in her father and sister; Glaucoma in her maternal uncle; Heart Problems in her father; Heart attack in her father; High blood pressure in her father; Hypertension in her father, maternal aunt, maternal uncle, and sister; Kidney disease in her maternal uncle; Obesity in her maternal aunt; Osteoporosis in her mother; Stroke in her father, maternal grandfather, paternal uncle, and another family member.  Current Medications  Current Facility-Administered Medications:    acetaminophen (TYLENOL) tablet 650 mg, 650 mg, Oral, Q4H PRN, 650 mg at 09/18/22 1122 **OR** acetaminophen (TYLENOL) 160 MG/5ML solution 650 mg, 650 mg, Per Tube, Q4H PRN **OR** acetaminophen (TYLENOL) suppository 650 mg, 650  mg, Rectal, Q4H PRN, de Saintclair Halsted, Cortney E, NP   amLODipine (NORVASC) tablet 10 mg, 10 mg, Oral, Daily, Marvel Plan, MD, 10 mg at 09/18/22 0856   Chlorhexidine Gluconate Cloth 2 % PADS 6 each, 6 each, Topical, Daily, Bhagat, Srishti L, MD, 6 each at 09/18/22 0855   clevidipine (CLEVIPREX) infusion 0.5 mg/mL, 0-32 mg/hr, Intravenous, Continuous, Marvel Plan, MD, Last Rate: 10 mL/hr at 09/18/22 1239, 5 mg/hr at 09/18/22 1239   heparin injection 5,000 Units, 5,000 Units, Subcutaneous, Q8H, Xu, Jindong, MD   labetalol (NORMODYNE) injection 10-20 mg, 10-20 mg, Intravenous, Q2H PRN, Leanord Hawking F, NP, 20 mg at 09/18/22 1144   losartan (COZAAR) tablet 100 mg, 100 mg, Oral, Daily, Marvel Plan, MD, 100 mg at 09/18/22 0856   metoprolol  tartrate (LOPRESSOR) tablet 50 mg, 50 mg, Oral, BID, Leanord Hawking F, NP   ondansetron [facility]) injection 4 mg, 4 mg, Intravenous, Q6H PRN, de Saintclair Halsted, Cortney E, NP, 4 mg at 09/16/22 1636   pantoprazole (PROTONIX) EC tablet 40 mg, 40 mg, Oral, Daily, Marvel Plan, MD, 40 mg at 09/18/22 0856   rosuvastatin (CRESTOR) tablet 40 mg, 40 mg, Oral, Daily, Marvel Plan, MD, 40 mg at 09/18/22 0857   senna-docusate (Senokot-S) tablet 1 tablet, 1 tablet, Oral, BID, de Saintclair Halsted, Cortney E, NP  Patients Current Diet:  Diet Order             Diet Heart Room service appropriate? Yes with Assist; Fluid consistency: Thin  Diet effective now                   Precautions / Restrictions Precautions Precautions: Fall Restrictions Weight Bearing Restrictions: No   Has the patient had 2 or more falls or a fall with injury in the past year? No  Prior Activity Level Community (5-7x/wk): Pt. was active in the community PTA  Prior Functional Level Self Care: Did the patient need help bathing, dressing, using the toilet or eating? Independent  Indoor Mobility: Did the patient need assistance with walking from room to room (with or without device)? Independent  Stairs: Did the patient need assistance with internal or external stairs (with or without device)? Independent  Functional Cognition: Did the patient need help planning regular tasks such as shopping or remembering to take medications? Independent  Patient Information Are you of Hispanic, Latino/a,or Spanish origin?: A. No, not of Hispanic, Latino/a, or Spanish origin What is your race?: A. White Do you need or want an interpreter to communicate with a doctor or health care staff?: 0. No  Patient's Response To:  Health Literacy and Transportation Is the patient able to respond to health literacy and transportation needs?: Yes Health Literacy - How often do you need to have someone help you when you read instructions, pamphlets, or other  written material from your doctor or pharmacy?: Never In the past 12 months, has lack of transportation kept you from medical appointments or from getting medications?: No In the past 12 months, has lack of transportation kept you from meetings, work, or from getting things needed for daily living?: No  Journalist, newspaper / Equipment Home Assistive Devices/Equipment: None Home Equipment: None  Prior Device Use: Indicate devices/aids used by the patient prior to current illness, exacerbation or injury? None of the above  Current Functional Level Cognition  Overall Cognitive Status: Within Functional Limits for tasks assessed Orientation Level: Oriented X4 General Comments: pt able to follow all commands, pt with delayed response time  but is sleepy, pt orientedx4    Extremity Assessment (includes Sensation/Coordination)  Upper Extremity Assessment: LUE deficits/detail LUE Deficits / Details: grossly 3-/5, impaired sensation LUE Sensation: decreased light touch, decreased proprioception  Lower Extremity Assessment: LLE deficits/detail LLE Deficits / Details: grossly 3/5, DF 1/5 LLE Sensation: decreased light touch    ADLs       Mobility  Overal bed mobility: Needs Assistance Bed Mobility: Supine to Sit Supine to sit: Mod assist, HOB elevated, +2 for physical assistance General bed mobility comments: pt with good effort to try and assist with R UE, pt pulled self up on PT while RN tech assist with bringing hips to EOB using bed pad, limited functional use from L UE and LE    Transfers  Overall transfer level: Needs assistance Equipment used: 2 person hand held assist (face to face transfer with use of gait belt) Transfers: Sit to/from Stand, Bed to chair/wheelchair/BSC Sit to Stand: Mod assist, +2 physical assistance Bed to/from chair/wheelchair/BSC transfer type:: Step pivot Step pivot transfers: Max assist, +2 physical assistance General transfer comment: pt unable to use L  UE to assist with transfer, L knee blocked due to buckling, maxA to advance L LE during transfer to chair    Ambulation / Gait / Stairs / Wheelchair Mobility  Ambulation/Gait General Gait Details: unable this date    Posture / Balance Dynamic Sitting Balance Sitting balance - Comments: when focused on it pt able to maintain balance with close min guard however due to lethargy pt with fall to the L requiring verbal cues to self correct Balance Overall balance assessment: Needs assistance Sitting-balance support: Feet supported, Single extremity supported Sitting balance-Leahy Scale: Poor Sitting balance - Comments: when focused on it pt able to maintain balance with close min guard however due to lethargy pt with fall to the L requiring verbal cues to self correct Postural control: Left lateral lean Standing balance support: During functional activity, Single extremity supported Standing balance-Leahy Scale: Zero Standing balance comment: dependent on PT and PT tech    Special needs/care consideration Special service needs ***   Previous Home Environment (from acute therapy documentation) Living Arrangements: Spouse/significant other, Children (17yo son who is home schooled)  Lives With: Spouse Available Help at Discharge: Family, Available 24 hours/day (spouse works but son can be there) Type of Home: House Home Layout: Able to live on main level with bedroom/bathroom Home Access: Stairs to enter Entrance Stairs-Rails: None Entrance Stairs-Number of Steps: 3 Bathroom Shower/Tub: Hydrographic surveyor, Health visitor: Pharmacist, community: Yes How Accessible: Accessible via wheelchair, Accessible via walker Home Care Services: No  Discharge Living Setting Plans for Discharge Living Setting: Patient's home Type of Home at Discharge: House Discharge Home Layout: Able to live on main level with bedroom/bathroom Discharge Home Access: Stairs to enter Entrance  Stairs-Rails: None Entrance Stairs-Number of Steps: 3 Discharge Bathroom Shower/Tub: Tub/shower unit Discharge Bathroom Toilet: Standard Discharge Bathroom Accessibility: No Does the patient have any problems obtaining your medications?: No  Social/Family/Support Systems Patient Roles: Spouse Contact Information: [phone] Anticipated Caregiver: Sarde Lene Ability/Limitations of Caregiver: Family to rotate to assit 24/7, husband may take FMLA Caregiver Availability: 24/7 Discharge Plan Discussed with Primary Caregiver: Yes Is Caregiver In Agreement with Plan?: Yes Does Caregiver/Family have Issues with Lodging/Transportation while Pt is in Rehab?: No  Goals Patient/Family Goal for Rehab: PT/OT/SLP MIn A Expected length of stay: 18-21 days Pt/Family Agrees to Admission and willing to participate: Yes Program Orientation Provided & Reviewed with  Pt/Caregiver Including Roles  & Responsibilities: Yes  Decrease burden of Care through IP rehab admission: not anticipated  Possible need for SNF placement upon discharge: not anticipated   Patient Condition: I have reviewed medical records from [facility], spoken with CM, and patient. I met with patient at the bedside for inpatient rehabilitation assessment.  Patient will benefit from ongoing PT, OT, and SLP, can actively participate in 3 hours of therapy a day 5 days of the week, and can make measurable gains during the admission.  Patient will also benefit from the coordinated team approach during an Inpatient Acute Rehabilitation admission.  The patient will receive intensive therapy as well as Rehabilitation physician, nursing, social worker, and care management interventions.  Due to safety, skin/wound care, disease management, medication administration, pain management, and patient education the patient requires 24 hour a day rehabilitation nursing.  The patient is currently min A  with mobility and basic ADLs.   Discharge setting and therapy post discharge at home with home health is anticipated.  Patient has agreed to participate in the Acute Inpatient Rehabilitation Program and will admit {Time; today/tomorrow:10263}.  Preadmission Screen Completed By:  Jeronimo Greaves, 09/18/2022 1:19 PM ______________________________________________________________________   Discussed status with Dr. Marland Kitchen on *** at *** and received approval for admission today.  Admission Coordinator:  Jeronimo Greaves, CCC-SLP, time Marland KitchenDorna Bloom ***   Assessment/Plan: Diagnosis: Does the need for close, 24 hr/day Medical supervision in concert with the patient's rehab needs make it unreasonable for this patient to be served in a less intensive setting? {yes_no_potentially:3041433} Co-Morbidities requiring supervision/potential complications: *** Due to {due WU:9811914}, does the patient require 24 hr/day rehab nursing? {yes_no_potentially:3041433} Does the patient require coordinated care of a physician, rehab nurse, PT, OT, and SLP to address physical and functional deficits in the context of the above medical diagnosis(es)? {yes_no_potentially:3041433} Addressing deficits in the following areas: {deficits:3041436} Can the patient actively participate in an intensive therapy program of at least 3 hrs of therapy 5 days a week? {yes_no_potentially:3041433} The potential for patient to make measurable gains while on inpatient rehab is {potential:3041437} Anticipated functional outcomes upon discharge from inpatient rehab: {functional outcomes:304600100} PT, {functional outcomes:304600100} OT, {functional outcomes:304600100} SLP Estimated rehab length of stay to reach the above functional goals is: *** Anticipated discharge destination: {anticipated dc setting:21604} 10. Overall Rehab/Functional Prognosis: {potential:3041437}   MD Signature: ***

## 2022-09-18 NOTE — Progress Notes (Addendum)
STROKE TEAM PROGRESS NOTE   SUBJECTIVE (INTERVAL HISTORY) Patient lying in the ICU bed in no acute distress.  She is awake and alert.  She is having headache pain.  Her family member is at the bedside.  Her nurses at the bedside.  We are still increasing her oral meds to maximize her blood pressure control.  She did refuse her extra dose of her orals last night and wanted to take it this morning.      OBJECTIVE Temp:  [98.3 F (36.8 C)-99.6 F (37.6 C)] 98.5 F (36.9 C) (01/07 0800) Pulse Rate:  [73-214] 73 (01/07 1200) Resp:  [13-32] 14 (01/07 1200) BP: (113-183)/(59-135) 138/67 (01/07 1200) SpO2:  [87 %-99 %] 94 % (01/07 1200)  Recent Labs  Lab 09/16/22 1333  GLUCAP 127*   Recent Labs  Lab 09/16/22 1356 09/17/22 0454 09/18/22 0412  NA 135 138 134*  K 3.6 3.8 3.5  CL 103 104 98  CO2 20* 24 25  GLUCOSE 131* 117* 120*  BUN 15 13 12   CREATININE 0.82 0.87 0.79  CALCIUM 8.9 9.0 8.9   Recent Labs  Lab 09/16/22 1356  AST 24  ALT 20  ALKPHOS 124  BILITOT 0.9  PROT 7.4  ALBUMIN 3.9   Recent Labs  Lab 09/16/22 1356 09/17/22 0454 09/18/22 0412  WBC 10.3 13.2* 13.5*  NEUTROABS 7.0  --   --   HGB 15.0 14.8 14.8  HCT 44.7 41.8 42.6  MCV 83.2 80.4 81.8  PLT 293 274 272   No results for input(s): "CKTOTAL", "CKMB", "CKMBINDEX", "TROPONINI" in the last 168 hours. Recent Labs    09/16/22 1356 09/16/22 2007 09/16/22 2142  LABPROT 13.3 12.8 13.4  INR 1.0 1.0 1.0   No results for input(s): "COLORURINE", "LABSPEC", "PHURINE", "GLUCOSEU", "HGBUR", "BILIRUBINUR", "KETONESUR", "PROTEINUR", "UROBILINOGEN", "NITRITE", "LEUKOCYTESUR" in the last 72 hours.  Invalid input(s): "APPERANCEUR"     Component Value Date/Time   CHOL 310 (H) 09/16/2022 1630   TRIG 225 (H) 09/16/2022 1630   HDL 59 09/16/2022 1630   CHOLHDL 5.3 09/16/2022 1630   VLDL 45 (H) 09/16/2022 1630   LDLCALC 206 (H) 09/16/2022 1630   No results found for: "HGBA1C"    Component Value Date/Time    LABOPIA NONE DETECTED 09/16/2022 1630   COCAINSCRNUR NONE DETECTED 09/16/2022 1630   LABBENZ NONE DETECTED 09/16/2022 1630   AMPHETMU NONE DETECTED 09/16/2022 1630   THCU NONE DETECTED 09/16/2022 1630   LABBARB NONE DETECTED 09/16/2022 1630    No results for input(s): "ETH" in the last 168 hours.  I have personally reviewed the radiological images below and agree with the radiology interpretations.  ECHOCARDIOGRAM COMPLETE  Result Date: 09/17/2022    ECHOCARDIOGRAM REPORT   Patient Name:   Kelly Stephenson Date of Exam: 09/17/2022 Medical Rec #:  [id number]        Height:       64.0 in Accession #:    [id number]       Weight:       203.7 lb Date of Birth:  [birth year]        BSA:          1.972 m Patient Age:    51 years         BP:           125/79 mmHg Patient Gender: F                HR:  86 bpm. Exam Location:  Inpatient Procedure: 2D Echo, Cardiac Doppler and Color Doppler Indications:    Stroke  History:        Patient has no prior history of Echocardiogram examinations.                 Risk Factors:Hypertension.  Sonographer:    Clayton Lefort RDCS (AE) Referring Phys: Altamease Oiler DE LA TORRE  Sonographer Comments: Suboptimal subcostal window. IMPRESSIONS  1. Left ventricular ejection fraction, by estimation, is 60 to 65%. The left ventricle has normal function. The left ventricle has no regional wall motion abnormalities. There is mild left ventricular hypertrophy. Left ventricular diastolic parameters are consistent with Grade I diastolic dysfunction (impaired relaxation).  2. Right ventricular systolic function is normal. The right ventricular size is mildly enlarged.  3. The mitral valve is abnormal. Trivial mitral valve regurgitation. No evidence of mitral stenosis.  4. The aortic valve is tricuspid. Aortic valve regurgitation is not visualized. No aortic stenosis is present. Comparison(s): No prior Echocardiogram. FINDINGS  Left Ventricle: Left ventricular ejection fraction, by estimation, is  60 to 65%. The left ventricle has normal function. The left ventricle has no regional wall motion abnormalities. The left ventricular internal cavity size was normal in size. There is  mild left ventricular hypertrophy. Left ventricular diastolic parameters are consistent with Grade I diastolic dysfunction (impaired relaxation). Right Ventricle: The right ventricular size is mildly enlarged. No increase in right ventricular wall thickness. Right ventricular systolic function is normal. Left Atrium: Left atrial size was normal in size. Right Atrium: Right atrial size was normal in size. Pericardium: There is no evidence of pericardial effusion. Mitral Valve: The mitral valve is abnormal. Mild to moderate mitral annular calcification. Trivial mitral valve regurgitation. No evidence of mitral valve stenosis. Tricuspid Valve: The tricuspid valve is not well visualized. Tricuspid valve regurgitation is not demonstrated. No evidence of tricuspid stenosis. Aortic Valve: The aortic valve is tricuspid. Aortic valve regurgitation is not visualized. No aortic stenosis is present. Aortic valve mean gradient measures 4.0 mmHg. Aortic valve peak gradient measures 7.7 mmHg. Aortic valve area, by VTI measures 2.42 cm. Pulmonic Valve: The pulmonic valve was not well visualized. Pulmonic valve regurgitation is trivial. No evidence of pulmonic stenosis. Aorta: The aortic root is normal in size and structure. Venous: The inferior vena cava was not well visualized. IAS/Shunts: The interatrial septum was not well visualized.  LEFT VENTRICLE PLAX 2D LVIDd:         4.30 cm   Diastology LVIDs:         2.90 cm   LV e' medial:    6.64 cm/s LV PW:         1.20 cm   LV E/e' medial:  7.9 LV IVS:        1.30 cm   LV e' lateral:   6.20 cm/s LVOT diam:     2.00 cm   LV E/e' lateral: 8.5 LV SV:         62 LV SV Index:   31 LVOT Area:     3.14 cm  RIGHT VENTRICLE RV Basal diam:  3.80 cm RV Mid diam:    3.50 cm RV S prime:     14.50 cm/s TAPSE  (M-mode): 2.3 cm LEFT ATRIUM             Index        RIGHT ATRIUM           Index LA diam:  3.10 cm 1.57 cm/m   RA Area:     18.40 cm LA Vol (A2C):   34.1 ml 17.29 ml/m  RA Volume:   53.70 ml  27.24 ml/m LA Vol (A4C):   22.1 ml 11.21 ml/m LA Biplane Vol: 27.7 ml 14.05 ml/m  AORTIC VALVE AV Area (Vmax):    2.42 cm AV Area (Vmean):   2.38 cm AV Area (VTI):     2.42 cm AV Vmax:           139.00 cm/s AV Vmean:          93.000 cm/s AV VTI:            0.254 m AV Peak Grad:      7.7 mmHg AV Mean Grad:      4.0 mmHg LVOT Vmax:         107.00 cm/s LVOT Vmean:        70.600 cm/s LVOT VTI:          0.196 m LVOT/AV VTI ratio: 0.77  AORTA Ao Root diam: 2.90 cm Ao Asc diam:  3.00 cm MITRAL VALVE MV Area (PHT): 2.77 cm    SHUNTS MV Decel Time: 274 msec    Systemic VTI:  0.20 m MV E velocity: 52.50 cm/s  Systemic Diam: 2.00 cm MV A velocity: 68.80 cm/s MV E/A ratio:  0.76 Vishnu Priya Mallipeddi Electronically signed by Winfield Rast Mallipeddi Signature Date/Time: 09/17/2022/11:49:50 AM    Final    CT HEAD WO CONTRAST ( )  Result Date: 09/16/2022 CLINICAL DATA:  Headache, sudden, severe Hydrocephalus EXAM: CT HEAD WITHOUT CONTRAST TECHNIQUE: Contiguous axial images were obtained from the base of the skull through the vertex without intravenous contrast. RADIATION DOSE REDUCTION: This exam was performed according to the departmental dose-optimization program which includes automated exposure control, adjustment of the mA and/or kV according to patient size and/or use of iterative reconstruction technique. COMPARISON:  1:28 p.m. FINDINGS: Brain: 12 x 17 x 20 mm (volume = 2.1 mL) hematoma within the right caudate body/superior thalamus is stable with extension of hemorrhage into the right lateral ventricle, third and fourth ventricle again noted. Ventricular size is normal and stable. No significant mass effect or midline shift. No acute infarct. Cerebellum is unremarkable. Vascular: No hyperdense vessel or  unexpected calcification. Skull: Normal. Negative for fracture or focal lesion. Sinuses/Orbits: Visualized orbits are unremarkable. Paranasal sinuses are clear. Other: Mastoid air cells and middle ear cavities are clear. IMPRESSION: 1. Stable right caudate body/superior thalamus hematoma with extension of hemorrhage into the right lateral ventricle, third and fourth ventricle. Ventricular size is normal and stable. No significant mass effect or midline shift. 2. No acute infarct. Electronically Signed   By: Helyn Numbers M.D.   On: 09/16/2022 21:44   MR BRAIN W WO CONTRAST  Result Date: 09/16/2022 CLINICAL DATA:  Nonhemorrhagic stroke. EXAM: MRI HEAD WITHOUT AND WITH CONTRAST TECHNIQUE: Multiplanar, multiecho pulse sequences of the brain and surrounding structures were obtained without and with intravenous contrast. CONTRAST:  9.17mL GADAVIST GADOBUTROL 1 MMOL/ML IV SOLN COMPARISON:  Head CT and CTA from earlier today FINDINGS: Brain: Known acute hematoma in the right thalamus and adjacent white matter with superior extension into the caudate body and right lateral ventricle. Blood clot reaches the fourth ventricle, no detected change from earlier CT. Small area of shine through in the left periventricular white matter, other areas of mild diffusion hyperintensity are related to susceptibility artifact from intraventricular clot. White matter disease with notable periventricular  ovoid pattern at the left lateral ventricle and hazy T2 hyperintensity in the right pons, demyelinating disease is considered. No abnormal enhancement at the level of the hemorrhage. No masslike finding throughout the brain. Vascular: Major flow voids and vascular enhancements are preserved. There was preceding CTA. Skull and upper cervical spine: Normal marrow signal. Sinuses/Orbits: Negative. IMPRESSION: 1. No lesion or infarct seen underlying the right thalamic hematoma. No detected rebleeding and no hydrocephalus. 2. Areas of T2  hyperintensity with periventricular and pontine involvement, question symptoms of multiple sclerosis. Given the atheromatous changes by CTA this may instead reflect premature small vessel disease in this patient with hypertension. Electronically Signed   By: Tiburcio Pea M.D.   On: 09/16/2022 19:03   CT ANGIO HEAD NECK W WO CM  Result Date: 09/16/2022 CLINICAL DATA:  Neuro deficit, acute, stroke suspected. Right basal ganglia hemorrhage. Rule out AVM. EXAM: CT ANGIOGRAPHY HEAD AND NECK TECHNIQUE: Multidetector CT imaging of the head and neck was performed using the standard protocol during bolus administration of intravenous contrast. Multiplanar CT image reconstructions and MIPs were obtained to evaluate the vascular anatomy. Carotid stenosis measurements (when applicable) are obtained utilizing NASCET criteria, using the distal internal carotid diameter as the denominator. RADIATION DOSE REDUCTION: This exam was performed according to the departmental dose-optimization program which includes automated exposure control, adjustment of the mA and/or kV according to patient size and/or use of iterative reconstruction technique. CONTRAST:  90mL OMNIPAQUE IOHEXOL 350 MG/ML SOLN COMPARISON:  None Available. FINDINGS: CTA NECK FINDINGS Aortic arch: Standard 3 vessel aortic arch. Wide patency of the brachiocephalic and subclavian arteries. Right carotid system: Patent without evidence of stenosis or dissection. Left carotid system: Patent with mild atheromatous wall thickening of the mid common carotid artery. No evidence of a significant stenosis or dissection. Vertebral arteries: Patent without evidence of stenosis or dissection. Strongly dominant left vertebral artery. Skeleton: Mild cervical spondylosis. Other neck: No evidence of cervical lymphadenopathy or mass. Upper chest: Clear lung apices. Review of the MIP images confirms the above findings CTA HEAD FINDINGS Anterior circulation: The internal carotid  arteries are patent from skull base to carotid termini with mild atherosclerotic irregularity but no significant stenosis. ACAs and MCAs are patent with mild branch vessel irregularity but no evidence of a proximal branch occlusion or significant proximal stenosis. No aneurysm or vascular malformation is identified, and there is no spot sign within the right basal ganglia hemorrhage. Posterior circulation: The intracranial vertebral arteries are widely patent to the basilar. Patent PICA, AICA, and SCA origins are seen bilaterally. The basilar artery is patent with mild atherosclerotic irregularity but no significant stenosis. There is a moderate-sized right posterior communicating artery. Moderate right P1 and mild bilateral P2 stenoses are present. No aneurysm is identified. Venous sinuses: Poorly evaluated due to arterial contrast timing. Anatomic variants: None. Review of the MIP images confirms the above findings IMPRESSION: 1. No evidence of a vascular malformation. 2. Intracranial atherosclerosis including moderate right P1 and mild bilateral P2 stenoses. 3. Widely patent carotid and vertebral arteries. Electronically Signed   By: Sebastian Ache M.D.   On: 09/16/2022 14:17   CT HEAD CODE STROKE WO CONTRAST  Result Date: 09/16/2022 CLINICAL DATA:  Code stroke.  Neuro deficit, acute, stroke suspected EXAM: CT HEAD WITHOUT CONTRAST TECHNIQUE: Contiguous axial images were obtained from the base of the skull through the vertex without intravenous contrast. RADIATION DOSE REDUCTION: This exam was performed according to the departmental dose-optimization program which includes automated exposure control, adjustment  of the mA and/or kV according to patient size and/or use of iterative reconstruction technique. COMPARISON:  None Available. FINDINGS: Brain: Approximately 1.6 x 1.4 x 2.0 cm acute hemorrhage (estimated volume of 2.2 mL) in the right basal ganglia extending and overlying corona radiata. Intraventricular  extension of hemorrhage into the right lateral ventricle, third ventricle, and fourth ventricle. No hydrocephalus at this time. No midline shift. No evidence of acute large vascular territory infarct. Vascular: No hyperdense vessel identified. Skull: No acute fracture. Sinuses/Orbits: Mild right sphenoid sinus mucosal thickening. Other: No mastoid effusions. ASPECTS [facility]([place] Stroke Program Early CT Score) IMPRESSION: Acute hemorrhage in the right basal ganglia extend overlying corona radiata, described above. Intraventricular extension of hemorrhage. Findings discussed with Dr. Selina CooleyStack at 1:34 PM via telephone. Electronically Signed   By: Feliberto HartsFrederick S Jones M.D.   On: 09/16/2022 13:35     PHYSICAL EXAM  Temp:  [98.3 F (36.8 C)-99.6 F (37.6 C)] 98.5 F (36.9 C) (01/07 0800) Pulse Rate:  [73-214] 73 (01/07 1200) Resp:  [13-32] 14 (01/07 1200) BP: (113-183)/(59-135) 138/67 (01/07 1200) SpO2:  [87 %-99 %] 94 % (01/07 1200)  General -well-nourished, well-developed, awake and alert. Cardiac-regular rhythm, regular rate in the 80s. Pulmonary-on room air.  Unlabored.  Regular effort. GI-abdomen soft and nondistended.  Neuro- Mental status-patient is awake and alert.  She is oriented to person, place, and time.  Language is intact.  Speech is intact. Sensation is diminished on the left and both the arm and the leg, also the left side of the face.  She has mild left facial droop. She has normal strength in the right upper and right lower extremity. On the left she is not able to lift them against gravity.  2/5 left upper extremity.  Can only wiggle her toes barely in the left lower extremity. No active ataxia on the right upper extremity. Gait not observed.  No tremors. Visual fields are intact bilaterally.  Extraocular movements are intact.    ASSESSMENT/Stephenson Kelly Stephenson is a 52 y.o. female with history of HTN admitted for left sided weakness and speech difficulty. No tPA given due to  ICH.    ICH:  right BG ICH and IVH secondary to uncontrolled hypertension CT head right BG small ICH/IVH CTA head and neck right P1 moderate stenosis, no AVM or aneurysm MRI  stable right BG ICH and IVH 2D Echo  EF 60-65% LDL 206 HgbA1c pending UDS neg Heparin subq for VTE prophylaxis No antithrombotic prior to admission, now on No antithrombotic due to ICH Ongoing aggressive stroke risk factor management Therapy recommendations:  CIR Disposition:  pending  Hypertensive emergency Unstable On cleviprex BP goal < 160 On amlodipine 10 mg daily, metoprolol changed to short acting 50 mg twice daily, Cozaar was added yesterday 100 mg daily.  Today we will add hydralazine 50 mg every 8 hours orally as well as an as needed hydralazine injection in an attempt to wean her off of Cleviprex. Long term BP goal normotensive  Hyperlipidemia Home meds:  none  LDL 206, goal < 70 Now on crestor 40 Continue statin at discharge  Other Stroke Risk Factors   Other Active Problems Leukocytosis- wbc 13.2->13.5 Hyponatremia - Na 138->134  Hospital day # 2  Kelly BalloonPriscilla F Massey, NP 09/18/2022 1:17 PM  ATTENDING NOTE: I reviewed above note and agree with the assessment and Stephenson. Pt was seen and examined.   Husband at bedside.  Patient lying in bed, mildly lethargic, still has left hemiparesis  but improving from yesterday.  BP still high, received multiple as needed IV labetalol overnight.  While increase metoprolol to 50 twice daily, add hydralazine, to taper off Cleviprex.  On Crestor.  Persistent mild leukocytosis, slight hyponatremia, will monitor CBC and BMP.  PT/OT commended CIR.  For detailed assessment and Stephenson, please refer to above/below as I have made changes wherever appropriate.   Kelly Plan, MD PhD Stroke Neurology 09/18/2022 5:36 PM    This patient is critically ill due to ICH with IVH, hypertensive emergency and at significant risk of neurological worsening, death form hematoma  expansion, cerebral edema, brain herniation, hypertensive encephalopathy, seizure. This patient's care requires constant monitoring of vital signs, hemodynamics, respiratory and cardiac monitoring, review of multiple databases, neurological assessment, discussion with family, other specialists and medical decision making of high complexity. I spent 35 minutes of neurocritical care time in the care of this patient. I had long discussion with patient and husband at bedside, updated pt current condition, treatment Stephenson and potential prognosis, and answered all the questions.  They expressed understanding and appreciation.      To contact Stroke Continuity provider, please refer to [email or web address]. After hours, contact General Neurology

## 2022-09-18 NOTE — Progress Notes (Signed)
Inpatient Rehab Admissions Coordinator:    I spoke with Pt. And her husband regarding potential CIR admit. They are interested and husband states family can work out 24/7 support. I will follow and open case with insurance once medically ready.   Clemens Catholic, [place], [place] Admissions Coordinator  [phone] ([place]) [phone] (office)

## 2022-09-19 ENCOUNTER — Inpatient Hospital Stay (HOSPITAL_COMMUNITY): Payer: BC Managed Care – PPO

## 2022-09-19 DIAGNOSIS — I161 Hypertensive emergency: Secondary | ICD-10-CM | POA: Diagnosis not present

## 2022-09-19 DIAGNOSIS — I610 Nontraumatic intracerebral hemorrhage in hemisphere, subcortical: Secondary | ICD-10-CM | POA: Diagnosis not present

## 2022-09-19 LAB — BASIC METABOLIC PANEL
Anion gap: 9 (ref 5–15)
BUN: 17 mg/dL (ref 6–20)
CO2: 23 mmol/L (ref 22–32)
Calcium: 9.1 mg/dL (ref 8.9–10.3)
Chloride: 101 mmol/L (ref 98–111)
Creatinine, Ser: 0.85 mg/dL (ref 0.44–1.00)
GFR, Estimated: >60 mL/min (ref >60)
Glucose, Bld: 130 mg/dL — ABNORMAL HIGH (ref 70–99)
Potassium: 4 mmol/L (ref 3.5–5.1)
Sodium: 133 mmol/L — ABNORMAL LOW (ref 135–145)

## 2022-09-19 LAB — CBC
HCT: 44.9 % (ref 36.0–46.0)
Hemoglobin: 15.5 g/dL — ABNORMAL HIGH (ref 12.0–15.0)
MCH: 28.6 pg (ref 26.0–34.0)
MCHC: 34.5 g/dL (ref 30.0–36.0)
MCV: 82.8 fL (ref 80.0–100.0)
Platelets: 278 10*3/uL (ref 150–400)
RBC: 5.42 MIL/uL — ABNORMAL HIGH (ref 3.87–5.11)
RDW: 13.2 % (ref 11.5–15.5)
WBC: 11.1 10*3/uL — ABNORMAL HIGH (ref 4.0–10.5)
nRBC: 0 % (ref 0.0–0.2)

## 2022-09-19 MED ORDER — HYDRALAZINE HCL 50 MG PO TABS
100.0000 mg | ORAL_TABLET | Freq: Three times a day (TID) | ORAL | Status: DC
  Administered 2022-09-19 – 2022-09-20 (×4): 100 mg via ORAL
  Filled 2022-09-19 (×4): qty 2

## 2022-09-19 MED ORDER — BUTALBITAL-APAP-CAFFEINE 50-325-40 MG PO TABS
1.0000 | ORAL_TABLET | Freq: Four times a day (QID) | ORAL | Status: DC | PRN
  Administered 2022-09-19 – 2022-09-20 (×3): 1 via ORAL
  Filled 2022-09-19 (×4): qty 1

## 2022-09-19 MED ORDER — OXYCODONE HCL 5 MG PO TABS
5.0000 mg | ORAL_TABLET | Freq: Four times a day (QID) | ORAL | Status: DC | PRN
  Administered 2022-09-19 – 2022-09-20 (×3): 5 mg via ORAL
  Filled 2022-09-19 (×3): qty 1

## 2022-09-19 NOTE — Progress Notes (Signed)
OT Cancellation Note  Patient Details Name: Kelly Stephenson MRN: [id number] DOB: Apr 15, 1971   Cancelled Treatment:    Reason Eval/Treat Not Completed: Medical issues which prohibited therapy;Pain limiting ability to participate. Pt with c/o "My head is killing me." Pt reports 8/10 headache "in the middle" and is constantly rubbing head and keeping eyes closed. BP also 164/81 which is above the <150 permissive systolic. Dr. Erlinda Hong and RN aware and asked OT to hold until BP within permissive range. Acute OT to return as able.   [place] 09/19/2022, 8:48 AM  Jesse Sans OTR/L Acute Rehabilitation Services Office: [phone]

## 2022-09-19 NOTE — Progress Notes (Signed)
Physical Therapy Treatment Patient Details Name: Kelly Stephenson MRN: [id number] DOB: 06/09/1971 Today's Date: 09/19/2022   History of Present Illness Kelly Stephenson is a 52 y.o. female [facility] due to sudden onset of left-sided weakness and word finding difficulties. 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 PMH: HTN not on medication, anxiety    PT Comments    Pt sleepy upon PT/OT arrival with report of "I just don't feel well" but agreeable to try. Pt then with request to go to the bathroom. Pt with improved L UE and LE strength today however remains significantly weaker than R side. Pt with noted impaired with L sided sensation and proprioception requiring verbal and tactile cues to maintain L quad contraction when stepping to prevent L knee buckling. Pt also with no recollection of PT session on Saturday or sitting up in the chair on Saturday. Continue to recommend AIR upon d/c for maximal functional recovery as pt was indep and working PTA. Acute PT to cont to follow.    Recommendations for follow up therapy are one component of a multi-disciplinary discharge planning process, led by the attending physician.  Recommendations may be updated based on patient status, additional functional criteria and insurance authorization.  Follow Up Recommendations  Acute inpatient rehab (3hours/day)     Assistance Recommended at Discharge Frequent or constant Supervision/Assistance  Patient can return home with the following A lot of help with walking and/or transfers;A lot of help with bathing/dressing/bathroom;Assist for transportation;Help with stairs or ramp for entrance;Assistance with cooking/housework   Equipment Recommendations  Other (comment) (TBD at next venue)    Recommendations for Other Services Rehab consult     Precautions / Restrictions Precautions Precautions: Fall Restrictions Weight Bearing Restrictions: No     Mobility  Bed Mobility Overal  bed mobility: Needs Assistance Bed Mobility: Supine to Sit     Supine to sit: Mod assist, HOB elevated, +2 for physical assistance     General bed mobility comments: pt with good effort to try and assist with R UE using bed rail, assist with bringing hips to EOB using bed pad, limited functional use from L UE (inattention and was kind of "left behind" and LE    Transfers Overall transfer level: Needs assistance Equipment used: 2 person hand held assist (face to face transfer with use of gait belt) Transfers: Sit to/from Stand, Bed to chair/wheelchair/BSC Sit to Stand: +2 physical assistance, Min assist, +2 safety/equipment   Step pivot transfers: +2 physical assistance, Mod assist, +2 safety/equipment       General transfer comment: good power up with min A +2, focused on multimodal cues for LLE activation for supporting RLE during steps, mod A +2 for step pivot to right with cues for safety bed>BSC>recliner    Ambulation/Gait               General Gait Details: unable this date   Stairs             Wheelchair Mobility    Modified Rankin (Stroke Patients Only) Modified Rankin (Stroke Patients Only) Pre-Morbid Rankin Score: No symptoms Modified Rankin: Severe disability     Balance Overall balance assessment: Needs assistance Sitting-balance support: Feet supported, Single extremity supported Sitting balance-Leahy Scale: Fair Sitting balance - Comments: improved from previous PT session, able to sit min guard - borderline supervision Postural control: Left lateral lean (with fatigue) Standing balance support: During functional activity, Bilateral upper extremity supported Standing balance-Leahy Scale:  Poor Standing balance comment: requires external assist for balance - but did attempt to perform pericare in standing s/p using BSC                            Cognition Arousal/Alertness: Awake/alert Behavior During Therapy: Flat affect (but also  minor impulsiveness, spirits improved by end of session) Overall Cognitive Status: Impaired/Different from baseline Area of Impairment: Memory, Safety/judgement, Problem solving                     Memory: Decreased short-term memory (did not recall PT session on saturday or sitting in the chair saturday)   Safety/Judgement: Decreased awareness of safety, Decreased awareness of deficits   Problem Solving: Difficulty sequencing, Requires verbal cues, Requires tactile cues General Comments: initially sleepy, but improved with activity, at moments impulsive, will continue to assess        Exercises General Exercises - Lower Extremity Long Arc Quad: AROM, Left, 10 reps, Seated    General Comments General comments (skin integrity, edema, etc.): VSS      Pertinent Vitals/Pain Pain Assessment Pain Assessment: 0-10 Pain Score: 5  Pain Location: headache Pain Descriptors / Indicators: Headache Pain Intervention(s): Monitored during session    Home Living Family/patient expects to be discharged to:: Private residence Living Arrangements: Spouse/significant other;Children (29yo son who is home schooled) Available Help at Discharge: Family;Available 24 hours/day (spouse works but son can be there) Type of Home: House Home Access: Stairs to enter Entrance Stairs-Rails: None Entrance Stairs-Number of Steps: 3   Home Layout: Able to live on main level with bedroom/bathroom Home Equipment: None      Prior Function            PT Goals (current goals can now be found in the care plan section) Acute Rehab PT Goals Patient Stated Goal: get better PT Goal Formulation: With patient/family Time For Goal Achievement: 10/01/22 Potential to Achieve Goals: Good Progress towards PT goals: Progressing toward goals    Frequency    Min 4X/week      PT Plan Current plan remains appropriate    Co-evaluation PT/OT/SLP Co-Evaluation/Treatment: Yes Reason for Co-Treatment: To  address functional/ADL transfers PT goals addressed during session: Mobility/safety with mobility OT goals addressed during session: ADL's and self-care;Strengthening/ROM      AM-PAC PT "6 Clicks" Mobility   Outcome Measure  Help needed turning from your back to your side while in a flat bed without using bedrails?: A Lot Help needed moving from lying on your back to sitting on the side of a flat bed without using bedrails?: A Lot Help needed moving to and from a bed to a chair (including a wheelchair)?: A Lot Help needed standing up from a chair using your arms (e.g., wheelchair or bedside chair)?: Total Help needed to walk in hospital room?: Total Help needed climbing 3-5 steps with a railing? : Total 6 Click Score: 9    End of Session Equipment Utilized During Treatment: Gait belt Activity Tolerance: Patient limited by lethargy (pt sleepy and reporting "I just don't feel good") Patient left: in chair;with call bell/phone within reach;with chair alarm set;with family/visitor present (spouse) Nurse Communication: Mobility status (use stedy to transfer pt back or turn chair around to complete std pvt to R side) PT Visit Diagnosis: Muscle weakness (generalized) (M62.81);Difficulty in walking, not elsewhere classified (R26.2)     Time: 7425-9563 PT Time Calculation (min) (ACUTE ONLY): 34 min  Charges:  $  Therapeutic Activity: 8-22 mins                     Lewis Shock, PT, DPT Acute Rehabilitation Services Secure chat preferred Office #: [phone]    Iona Hansen 09/19/2022, 1:55 PM

## 2022-09-19 NOTE — Progress Notes (Signed)
PT Cancellation Note  Patient Details Name: Kelly Stephenson MRN: [id number] DOB: 02-25-1971   Cancelled Treatment:    Reason Eval/Treat Not Completed: Other (comment). Pt with c/o "My head is killing me." Pt reports 8/10 headache "in the middle" and is constantly rubbing head and keeping eyes closed. BP also 164/81 which is above the <150 permissive systolic. Dr. Erlinda Hong and RN aware and asked PT to hold until BP within permissive range. Acute PT to return as able.   Kittie Plater, PT, DPT Acute Rehabilitation Services Secure chat preferred Office #: [phone]    Berline Lopes 09/19/2022, 8:47 AM

## 2022-09-19 NOTE — Progress Notes (Signed)
  Transition of Care [facility]) Screening Note   Patient Details  Name: Kelly Stephenson Date of Birth: October 10, 1970   Transition of Care [facility]) CM/SW Contact:    Benard Halsted, LCSW Phone Number: 09/19/2022, 9:50 AM    Transition of Care Department [facility]) has reviewed patient and CIR is following. We will continue to monitor patient advancement through interdisciplinary progression rounds. If new patient transition needs arise, please place a TOC consult.

## 2022-09-19 NOTE — Progress Notes (Signed)
SLP Cancellation Note  Patient Details Name: AMBERLYN MARTINEZGARCIA MRN: [id number] DOB: 1971/03/01   Cancelled treatment:       Reason Eval/Treat Not Completed: Medical issues which prohibited therapy. Patient had complained of severe headache this am and is now sleeping. RN advised SLP to hold evaluation at this time and check back either late this pm or next date. Will f/u.   Gabriel Rainwater MA, CCC-SLP    Adi Doro Meryl 09/19/2022, 11:48 AM

## 2022-09-19 NOTE — Progress Notes (Signed)
Inpatient Rehab Admissions Coordinator:    CIR following for potential admit once medically stable. Will need OT/PT notes in order to open case with insurance, but BP not within range to work with therapies this AM.  Clemens Catholic, [place], [place] Admissions Coordinator  [phone] ([place]) [phone] (office)

## 2022-09-19 NOTE — Evaluation (Signed)
Occupational Therapy Evaluation Patient Details Name: Kelly Stephenson MRN: [id number] DOB: 18-Jan-1971 Today's Date: 09/19/2022   History of Present Illness Kelly Stephenson is a 52 y.o. female brought to [facility] due to sudden onset of left-sided weakness and word finding difficulties. 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 PMH: HTN not on medication, anxiety   Clinical Impression   Pt is typically independent in ADL and mobility, works full time as a Scientist, forensic and drives. Today Pt demonstrates deficits in balance, cognition, L inattention, hemiplegia (L non-dominant side) and is overall mod A for UB and LB ADL. Min A +2 for sit<>stand (can be impulsive at attempts to stand), and mod A +2 with multimodal cues for activation of LLE to prevent buckling. Pt does engage LUE spontaneously with donning socks, and required cues to engage LUE for hand washing. At this time recommending AIR post-acute to [facility] safety and independence in ADL and functional transfers and return to PLOF.       Recommendations for follow up therapy are one component of a multi-disciplinary discharge planning process, led by the attending physician.  Recommendations may be updated based on patient status, additional functional criteria and insurance authorization.   Follow Up Recommendations  Acute inpatient rehab (3hours/day)     Assistance Recommended at Discharge Frequent or constant Supervision/Assistance  Patient can return home with the following Two people to help with walking and/or transfers;A lot of help with bathing/dressing/bathroom;Assistance with cooking/housework;Direct supervision/assist for medications management;Direct supervision/assist for financial management;Assist for transportation;Help with stairs or ramp for entrance    Functional Status Assessment  Patient has had a recent decline in their functional status and demonstrates the ability to make significant improvements in  function in a reasonable and predictable amount of time.  Equipment Recommendations  Other (comment) (TBA)    Recommendations for Other Services Rehab consult     Precautions / Restrictions Precautions Precautions: Fall Restrictions Weight Bearing Restrictions: No      Mobility Bed Mobility Overal bed mobility: Needs Assistance Bed Mobility: Supine to Sit     Supine to sit: Mod assist, HOB elevated, +2 for physical assistance     General bed mobility comments: pt with good effort to try and assist with R UE using bed rail, assist with bringing hips to EOB using bed pad, limited functional use from L UE (inattention and was kind of "left behind" and LE    Transfers Overall transfer level: Needs assistance Equipment used: 2 person hand held assist (face to face transfer with use of gait belt) Transfers: Sit to/from Stand, Bed to chair/wheelchair/BSC Sit to Stand: +2 physical assistance, Min assist, +2 safety/equipment     Step pivot transfers: +2 physical assistance, Mod assist, +2 safety/equipment     General transfer comment: good power up with min A +2, focused on multimodal cues for LLE activation for supporting RLE during steps, mod A +2 for step pivot to right with cues for safety bed>BSC>recliner      Balance Overall balance assessment: Needs assistance Sitting-balance support: Feet supported, Single extremity supported Sitting balance-Leahy Scale: Fair Sitting balance - Comments: improved from previous PT session, able to sit min guard - borderline supervision Postural control: Left lateral lean (with fatigue) Standing balance support: During functional activity, Bilateral upper extremity supported Standing balance-Leahy Scale: Poor Standing balance comment: requires external assist for balance - improved from previous session  ADL either performed or assessed with clinical judgement   ADL Overall ADL's : Needs  assistance/impaired Eating/Feeding: Moderate assistance;Sitting Eating/Feeding Details (indicate cue type and reason): for Bil tasks like cutting, but able to self-feed afterwards Grooming: Wash/dry face;Wash/dry hands;Minimal assistance;Sitting Grooming Details (indicate cue type and reason): cues to engage LUE Upper Body Bathing: Minimal assistance;Sitting   Lower Body Bathing: Moderate assistance;Sit to/from stand   Upper Body Dressing : Moderate assistance;Sitting Upper Body Dressing Details (indicate cue type and reason): to don second gown Lower Body Dressing: Moderate assistance;Sitting/lateral leans Lower Body Dressing Details (indicate cue type and reason): able to perform figure 4 and use R hand to don sock on L foot - LUE attempting to assist , weak pincher grasp and so futile at [employer] stime Toilet Transfer: Moderate assistance;+2 for physical assistance;+2 for safety/equipment Toilet Transfer Details (indicate cue type and reason): to the R, min A for stand, mod for pivot with multimodal cues for LLE to straighten/activate Toileting- Clothing Manipulation and Hygiene: Moderate assistance;+2 for safety/equipment;Sit to/from stand Toileting - Clothing Manipulation Details (indicate cue type and reason): able to stand with min A +2 and with OT pulling aside gown, able to perform front peri care in standing     Functional mobility during ADLs: Moderate assistance;+2 for physical assistance;+2 for safety/equipment;Cueing for safety;Cueing for sequencing (SPT only) General ADL Comments: L inattention, deficits in balance, cognition, coordination, proprioception all impacting ADL and functional transfers     Vision Ability to See in Adequate Light: 0 Adequate Patient Visual Report: No change from baseline Additional Comments: some inattention to the left, but can and will look with cues     Perception     Praxis      Pertinent Vitals/Pain Pain Assessment Pain Assessment:  0-10 Pain Score: 5  Pain Location: headache Pain Descriptors / Indicators: Headache Pain Intervention(s): Monitored during session, Repositioned, Premedicated before session     Hand Dominance Right   Extremity/Trunk Assessment Upper Extremity Assessment Upper Extremity Assessment: LUE deficits/detail LUE Deficits / Details: digits 3+/5, flexion/extension at digits, elbow 3/5 and increased time required, shoulder 2+/5 - limited shoulder flexion and abduction LUE Sensation: decreased light touch;decreased proprioception LUE Coordination: decreased fine motor;decreased gross motor   Lower Extremity Assessment Lower Extremity Assessment: Defer to PT evaluation   Cervical / Trunk Assessment Cervical / Trunk Assessment: Normal   Communication Communication Communication: Expressive difficulties (soft spoken and word finding difficulties)   Cognition Arousal/Alertness: Awake/alert Behavior During Therapy: Flat affect (but also minor impulsiveness) Overall Cognitive Status: Impaired/Different from baseline Area of Impairment: Memory, Safety/judgement, Problem solving                     Memory: Decreased short-term memory (did not recall PT session on Sat and OOB)   Safety/Judgement: Decreased awareness of safety, Decreased awareness of deficits   Problem Solving: Difficulty sequencing, Requires verbal cues, Requires tactile cues General Comments: initially sleepy, but improved with activity, at moments impulsive, will continue to assess     General Comments  VSS throughout session on RA, Pt's husband present throughout session    Exercises     Shoulder Instructions      Home Living Family/patient expects to be discharged to:: Private residence Living Arrangements: Spouse/significant other;Children (92yo son who is home schooled) Available Help at Discharge: Family;Available 24 hours/day (spouse works but son can be there) Type of Home: House Home Access: Stairs to  enter [employer] of Steps: 3 Entrance Stairs-Rails: None Home Layout:  Able to live on main level with bedroom/bathroom     Bathroom Shower/Tub: Tub/shower unit;Walk-in shower   Bathroom Toilet: Standard Bathroom Accessibility: Yes How Accessible: Accessible via wheelchair;Accessible via walker Home Equipment: None      Lives With: Spouse    Prior Functioning/Environment Prior Level of Function : Independent/Modified Independent;Driving;Working/employed             Mobility Comments: indep ADLs Comments: indep        OT Problem List: Decreased strength;Decreased range of motion;Decreased activity tolerance;Impaired balance (sitting and/or standing);Decreased coordination;Decreased cognition;Decreased safety awareness;Decreased knowledge of use of DME or AE;Impaired sensation;Impaired UE functional use;Pain      OT Treatment/Interventions: Self-care/ADL training;Therapeutic exercise;Neuromuscular education;DME and/or AE instruction;Manual therapy;Therapeutic activities;Cognitive remediation/compensation;Patient/family education;Balance training    OT Goals(Current goals can be found in the care plan section) Acute Rehab OT Goals Patient Stated Goal: get to inpatient rehab OT Goal Formulation: With patient Time For Goal Achievement: 10/03/22 Potential to Achieve Goals: Good ADL Goals Pt Will Perform Grooming: with supervision;sitting Pt Will Perform Upper Body Dressing: with supervision;sitting Pt Will Perform Lower Body Dressing: with supervision;sit to/from stand Pt Will Transfer to Toilet: with min guard assist;stand pivot transfer;bedside commode Pt Will Perform Toileting - Clothing Manipulation and hygiene: with supervision;sit to/from stand Additional ADL Goal #1: Pt will perform bed mobility at supervision level prior to engaging in ADL  OT Frequency: Min 2X/week    Co-evaluation PT/OT/SLP Co-Evaluation/Treatment: Yes Reason for Co-Treatment: For  patient/therapist safety;To address functional/ADL transfers PT goals addressed during session: Mobility/safety with mobility;Balance;Strengthening/ROM OT goals addressed during session: ADL's and self-care;Strengthening/ROM      AM-PAC OT "6 Clicks" Daily Activity     Outcome Measure Help from another person eating meals?: A Little Help from another person taking care of personal grooming?: A Little Help from another person toileting, which includes using toliet, bedpan, or urinal?: A Lot Help from another person bathing (including washing, rinsing, drying)?: A Lot Help from another person to put on and taking off regular upper body clothing?: A Lot Help from another person to put on and taking off regular lower body clothing?: A Lot 6 Click Score: 14   End of Session Equipment Utilized During Treatment: Gait belt Nurse Communication: Mobility status;Precautions (may want to use stedy for return to bed - also written on white board)  Activity Tolerance: Patient tolerated treatment well Patient left: in chair;with call bell/phone within reach;with chair alarm set;with family/visitor present  OT Visit Diagnosis: Unsteadiness on feet (R26.81);Muscle weakness (generalized) (M62.81);Other symptoms and signs involving the nervous system (R29.898);Other symptoms and signs involving cognitive function;Hemiplegia and hemiparesis;Pain Hemiplegia - Right/Left: Left Hemiplegia - dominant/non-dominant: Non-Dominant Hemiplegia - caused by: Nontraumatic intracerebral hemorrhage Pain - part of body:  (headache)                Time: [id number] OT Time Calculation (min): 25 min Charges:  OT General Charges $OT Visit: 1 Visit OT Evaluation $OT Eval Moderate Complexity: [place] OTR/L Acute Rehabilitation Services Office: [place] 09/19/2022, 1:48 PM

## 2022-09-19 NOTE — Progress Notes (Signed)
STROKE TEAM PROGRESS NOTE   SUBJECTIVE (INTERVAL HISTORY) Husband at the bedside but sleeping. Pt lying in bed, complaining of headache, will add fioricet PRN. BP still on the high end, will increase BP po meds. Off cleviprex, on PRN IV meds.    OBJECTIVE Temp:  [97.5 F (36.4 C)-98.9 F (37.2 C)] 97.5 F (36.4 C) (01/08 1200) Pulse Rate:  [69-99] 72 (01/08 1230) Resp:  [12-24] 13 (01/08 1230) BP: (101-171)/(36-134) 147/89 (01/08 1300) SpO2:  [89 %-98 %] 93 % (01/08 1230)  Recent Labs  Lab 09/16/22 1333  GLUCAP 127*   Recent Labs  Lab 09/16/22 1356 09/17/22 0454 09/18/22 0412 09/19/22 0444  NA 135 138 134* 133*  K 3.6 3.8 3.5 4.0  CL 103 104 98 101  CO2 20* 24 25 23   GLUCOSE 131* 117* 120* 130*  BUN 15 13 12 17   CREATININE 0.82 0.87 0.79 0.85  CALCIUM 8.9 9.0 8.9 9.1   Recent Labs  Lab 09/16/22 1356  AST 24  ALT 20  ALKPHOS 124  BILITOT 0.9  PROT 7.4  ALBUMIN 3.9   Recent Labs  Lab 09/16/22 1356 09/17/22 0454 09/18/22 0412 09/19/22 0444  WBC 10.3 13.2* 13.5* 11.1*  NEUTROABS 7.0  --   --   --   HGB 15.0 14.8 14.8 15.5*  HCT 44.7 41.8 42.6 44.9  MCV 83.2 80.4 81.8 82.8  PLT 293 274 272 278   No results for input(s): "CKTOTAL", "CKMB", "CKMBINDEX", "TROPONINI" in the last 168 hours. Recent Labs    09/16/22 1356 09/16/22 2007 09/16/22 2142  LABPROT 13.3 12.8 13.4  INR 1.0 1.0 1.0   No results for input(s): "COLORURINE", "LABSPEC", "PHURINE", "GLUCOSEU", "HGBUR", "BILIRUBINUR", "KETONESUR", "PROTEINUR", "UROBILINOGEN", "NITRITE", "LEUKOCYTESUR" in the last 72 hours.  Invalid input(s): "APPERANCEUR"     Component Value Date/Time   CHOL 310 (H) 09/16/2022 1630   TRIG 225 (H) 09/16/2022 1630   HDL 59 09/16/2022 1630   CHOLHDL 5.3 09/16/2022 1630   VLDL 45 (H) 09/16/2022 1630   LDLCALC 206 (H) 09/16/2022 1630   No results found for: "HGBA1C"    Component Value Date/Time   LABOPIA NONE DETECTED 09/16/2022 1630   COCAINSCRNUR NONE DETECTED  09/16/2022 1630   LABBENZ NONE DETECTED 09/16/2022 1630   AMPHETMU NONE DETECTED 09/16/2022 1630   THCU NONE DETECTED 09/16/2022 1630   LABBARB NONE DETECTED 09/16/2022 1630    No results for input(s): "ETH" in the last 168 hours.  I have personally reviewed the radiological images below and agree with the radiology interpretations.  ECHOCARDIOGRAM COMPLETE  Result Date: 09/17/2022    ECHOCARDIOGRAM REPORT   Patient Name:   Kelly Stephenson Date of Exam: 09/17/2022 Medical Rec #:  Caren Griffins        Height:       64.0 in Accession #:    11/16/2022       Weight:       203.7 lb Date of Birth:  1971-06-18        BSA:          1.972 m Patient Age:    52 years         BP:           125/79 mmHg Patient Gender: F                HR:           86 bpm. Exam Location:  Inpatient Procedure: 2D Echo, Cardiac Doppler and Color Doppler Indications:  Stroke  History:        Patient has no prior history of Echocardiogram examinations.                 Risk Factors:Hypertension.  Sonographer:    Ross Ludwig RDCS (AE) Referring Phys: Lennox Solders DE LA TORRE  Sonographer Comments: Suboptimal subcostal window. IMPRESSIONS  1. Left ventricular ejection fraction, by estimation, is 60 to 65%. The left ventricle has normal function. The left ventricle has no regional wall motion abnormalities. There is mild left ventricular hypertrophy. Left ventricular diastolic parameters are consistent with Grade I diastolic dysfunction (impaired relaxation).  2. Right ventricular systolic function is normal. The right ventricular size is mildly enlarged.  3. The mitral valve is abnormal. Trivial mitral valve regurgitation. No evidence of mitral stenosis.  4. The aortic valve is tricuspid. Aortic valve regurgitation is not visualized. No aortic stenosis is present. Comparison(s): No prior Echocardiogram. FINDINGS  Left Ventricle: Left ventricular ejection fraction, by estimation, is 60 to 65%. The left ventricle has normal function. The left  ventricle has no regional wall motion abnormalities. The left ventricular internal cavity size was normal in size. There is  mild left ventricular hypertrophy. Left ventricular diastolic parameters are consistent with Grade I diastolic dysfunction (impaired relaxation). Right Ventricle: The right ventricular size is mildly enlarged. No increase in right ventricular wall thickness. Right ventricular systolic function is normal. Left Atrium: Left atrial size was normal in size. Right Atrium: Right atrial size was normal in size. Pericardium: There is no evidence of pericardial effusion. Mitral Valve: The mitral valve is abnormal. Mild to moderate mitral annular calcification. Trivial mitral valve regurgitation. No evidence of mitral valve stenosis. Tricuspid Valve: The tricuspid valve is not well visualized. Tricuspid valve regurgitation is not demonstrated. No evidence of tricuspid stenosis. Aortic Valve: The aortic valve is tricuspid. Aortic valve regurgitation is not visualized. No aortic stenosis is present. Aortic valve mean gradient measures 4.0 mmHg. Aortic valve peak gradient measures 7.7 mmHg. Aortic valve area, by VTI measures 2.42 cm. Pulmonic Valve: The pulmonic valve was not well visualized. Pulmonic valve regurgitation is trivial. No evidence of pulmonic stenosis. Aorta: The aortic root is normal in size and structure. Venous: The inferior vena cava was not well visualized. IAS/Shunts: The interatrial septum was not well visualized.  LEFT VENTRICLE PLAX 2D LVIDd:         4.30 cm   Diastology LVIDs:         2.90 cm   LV e' medial:    6.64 cm/s LV PW:         1.20 cm   LV E/e' medial:  7.9 LV IVS:        1.30 cm   LV e' lateral:   6.20 cm/s LVOT diam:     2.00 cm   LV E/e' lateral: 8.5 LV SV:         62 LV SV Index:   31 LVOT Area:     3.14 cm  RIGHT VENTRICLE RV Basal diam:  3.80 cm RV Mid diam:    3.50 cm RV S prime:     14.50 cm/s TAPSE (M-mode): 2.3 cm LEFT ATRIUM             Index        RIGHT  ATRIUM           Index LA diam:        3.10 cm 1.57 cm/m   RA Area:  18.40 cm LA Vol (A2C):   34.1 ml 17.29 ml/m  RA Volume:   53.70 ml  27.24 ml/m LA Vol (A4C):   22.1 ml 11.21 ml/m LA Biplane Vol: 27.7 ml 14.05 ml/m  AORTIC VALVE AV Area (Vmax):    2.42 cm AV Area (Vmean):   2.38 cm AV Area (VTI):     2.42 cm AV Vmax:           139.00 cm/s AV Vmean:          93.000 cm/s AV VTI:            0.254 m AV Peak Grad:      7.7 mmHg AV Mean Grad:      4.0 mmHg LVOT Vmax:         107.00 cm/s LVOT Vmean:        70.600 cm/s LVOT VTI:          0.196 m LVOT/AV VTI ratio: 0.77  AORTA Ao Root diam: 2.90 cm Ao Asc diam:  3.00 cm MITRAL VALVE MV Area (PHT): 2.77 cm    SHUNTS MV Decel Time: 274 msec    Systemic VTI:  0.20 m MV E velocity: 52.50 cm/s  Systemic Diam: 2.00 cm MV A velocity: 68.80 cm/s MV E/A ratio:  0.76 Vishnu Priya Mallipeddi Electronically signed by Winfield Rast Mallipeddi Signature Date/Time: 09/17/2022/11:49:50 AM    Final    CT HEAD WO CONTRAST ( )  Result Date: 09/16/2022 CLINICAL DATA:  Headache, sudden, severe Hydrocephalus EXAM: CT HEAD WITHOUT CONTRAST TECHNIQUE: Contiguous axial images were obtained from the base of the skull through the vertex without intravenous contrast. RADIATION DOSE REDUCTION: This exam was performed according to the departmental dose-optimization program which includes automated exposure control, adjustment of the mA and/or kV according to patient size and/or use of iterative reconstruction technique. COMPARISON:  1:28 p.m. FINDINGS: Brain: 12 x 17 x 20 mm (volume = 2.1 mL) hematoma within the right caudate body/superior thalamus is stable with extension of hemorrhage into the right lateral ventricle, third and fourth ventricle again noted. Ventricular size is normal and stable. No significant mass effect or midline shift. No acute infarct. Cerebellum is unremarkable. Vascular: No hyperdense vessel or unexpected calcification. Skull: Normal. Negative for fracture or  focal lesion. Sinuses/Orbits: Visualized orbits are unremarkable. Paranasal sinuses are clear. Other: Mastoid air cells and middle ear cavities are clear. IMPRESSION: 1. Stable right caudate body/superior thalamus hematoma with extension of hemorrhage into the right lateral ventricle, third and fourth ventricle. Ventricular size is normal and stable. No significant mass effect or midline shift. 2. No acute infarct. Electronically Signed   By: Helyn Numbers M.D.   On: 09/16/2022 21:44   MR BRAIN W WO CONTRAST  Result Date: 09/16/2022 CLINICAL DATA:  Nonhemorrhagic stroke. EXAM: MRI HEAD WITHOUT AND WITH CONTRAST TECHNIQUE: Multiplanar, multiecho pulse sequences of the brain and surrounding structures were obtained without and with intravenous contrast. CONTRAST:  9.65mL GADAVIST GADOBUTROL 1 MMOL/ML IV SOLN COMPARISON:  Head CT and CTA from earlier today FINDINGS: Brain: Known acute hematoma in the right thalamus and adjacent white matter with superior extension into the caudate body and right lateral ventricle. Blood clot reaches the fourth ventricle, no detected change from earlier CT. Small area of shine through in the left periventricular white matter, other areas of mild diffusion hyperintensity are related to susceptibility artifact from intraventricular clot. White matter disease with notable periventricular ovoid pattern at the left lateral ventricle and hazy T2 hyperintensity in  the right pons, demyelinating disease is considered. No abnormal enhancement at the level of the hemorrhage. No masslike finding throughout the brain. Vascular: Major flow voids and vascular enhancements are preserved. There was preceding CTA. Skull and upper cervical spine: Normal marrow signal. Sinuses/Orbits: Negative. IMPRESSION: 1. No lesion or infarct seen underlying the right thalamic hematoma. No detected rebleeding and no hydrocephalus. 2. Areas of T2 hyperintensity with periventricular and pontine involvement, question  symptoms of multiple sclerosis. Given the atheromatous changes by CTA this may instead reflect premature small vessel disease in this patient with hypertension. Electronically Signed   By: Jorje Guild M.D.   On: 09/16/2022 19:03   CT ANGIO HEAD NECK W WO CM  Result Date: 09/16/2022 CLINICAL DATA:  Neuro deficit, acute, stroke suspected. Right basal ganglia hemorrhage. Rule out AVM. EXAM: CT ANGIOGRAPHY HEAD AND NECK TECHNIQUE: Multidetector CT imaging of the head and neck was performed using the standard protocol during bolus administration of intravenous contrast. Multiplanar CT image reconstructions and MIPs were obtained to evaluate the vascular anatomy. Carotid stenosis measurements (when applicable) are obtained utilizing NASCET criteria, using the distal internal carotid diameter as the denominator. RADIATION DOSE REDUCTION: This exam was performed according to the departmental dose-optimization program which includes automated exposure control, adjustment of the mA and/or kV according to patient size and/or use of iterative reconstruction technique. CONTRAST:  84mL OMNIPAQUE IOHEXOL 350 MG/ML SOLN COMPARISON:  None Available. FINDINGS: CTA NECK FINDINGS Aortic arch: Standard 3 vessel aortic arch. Wide patency of the brachiocephalic and subclavian arteries. Right carotid system: Patent without evidence of stenosis or dissection. Left carotid system: Patent with mild atheromatous wall thickening of the mid common carotid artery. No evidence of a significant stenosis or dissection. Vertebral arteries: Patent without evidence of stenosis or dissection. Strongly dominant left vertebral artery. Skeleton: Mild cervical spondylosis. Other neck: No evidence of cervical lymphadenopathy or mass. Upper chest: Clear lung apices. Review of the MIP images confirms the above findings CTA HEAD FINDINGS Anterior circulation: The internal carotid arteries are patent from skull base to carotid termini with mild  atherosclerotic irregularity but no significant stenosis. ACAs and MCAs are patent with mild branch vessel irregularity but no evidence of a proximal branch occlusion or significant proximal stenosis. No aneurysm or vascular malformation is identified, and there is no spot sign within the right basal ganglia hemorrhage. Posterior circulation: The intracranial vertebral arteries are widely patent to the basilar. Patent PICA, AICA, and SCA origins are seen bilaterally. The basilar artery is patent with mild atherosclerotic irregularity but no significant stenosis. There is a moderate-sized right posterior communicating artery. Moderate right P1 and mild bilateral P2 stenoses are present. No aneurysm is identified. Venous sinuses: Poorly evaluated due to arterial contrast timing. Anatomic variants: None. Review of the MIP images confirms the above findings IMPRESSION: 1. No evidence of a vascular malformation. 2. Intracranial atherosclerosis including moderate right P1 and mild bilateral P2 stenoses. 3. Widely patent carotid and vertebral arteries. Electronically Signed   By: Logan Bores M.D.   On: 09/16/2022 14:17   CT HEAD CODE STROKE WO CONTRAST  Result Date: 09/16/2022 CLINICAL DATA:  Code stroke.  Neuro deficit, acute, stroke suspected EXAM: CT HEAD WITHOUT CONTRAST TECHNIQUE: Contiguous axial images were obtained from the base of the skull through the vertex without intravenous contrast. RADIATION DOSE REDUCTION: This exam was performed according to the departmental dose-optimization program which includes automated exposure control, adjustment of the mA and/or kV according to patient size and/or use of  iterative reconstruction technique. COMPARISON:  None Available. FINDINGS: Brain: Approximately 1.6 x 1.4 x 2.0 cm acute hemorrhage (estimated volume of 2.2 mL) in the right basal ganglia extending and overlying corona radiata. Intraventricular extension of hemorrhage into the right lateral ventricle, third  ventricle, and fourth ventricle. No hydrocephalus at this time. No midline shift. No evidence of acute large vascular territory infarct. Vascular: No hyperdense vessel identified. Skull: No acute fracture. Sinuses/Orbits: Mild right sphenoid sinus mucosal thickening. Other: No mastoid effusions. ASPECTS [facility]([place] Stroke Program Early CT Score) IMPRESSION: Acute hemorrhage in the right basal ganglia extend overlying corona radiata, described above. Intraventricular extension of hemorrhage. Findings discussed with Dr. Selina CooleyStack at 1:34 PM via telephone. Electronically Signed   By: Feliberto HartsFrederick S Jones M.D.   On: 09/16/2022 13:35     PHYSICAL EXAM  Temp:  [97.5 F (36.4 C)-98.9 F (37.2 C)] 97.5 F (36.4 C) (01/08 1200) Pulse Rate:  [69-99] 72 (01/08 1230) Resp:  [12-24] 13 (01/08 1230) BP: (101-171)/(36-134) 147/89 (01/08 1300) SpO2:  [89 %-98 %] 93 % (01/08 1230)  General - Well nourished, well developed, in no apparent distress, mildly lethargic.   Ophthalmologic - fundi not visualized due to noncooperation.   Cardiovascular - Regular rhythm and rate.   Mental Status -  Level of arousal and orientation to time, place, and person were intact. Language including expression, naming, repetition, comprehension was assessed and found intact.   Cranial Nerves II - XII - II - Visual field intact OU. III, IV, VI - Extraocular movements intact. V - Facial sensation decreased on the left VII - left mild facial droop VIII - Hearing & vestibular intact bilaterally. X - Palate elevates symmetrically. XI - Chin turning & shoulder shrug intact bilaterally. XII - Tongue protrusion intact.   Motor Strength - The patient's strength was normal in RUE and RLE, but LUE 3+/5 and LLE 4/5 proximal and 3+/5 distally.  Bulk was normal and fasciculations were absent.   Motor Tone - Muscle tone was assessed at the neck and appendages and was normal.   Reflexes - The patient's reflexes were symmetrical in all  extremities and she had no pathological reflexes.   Sensory - Light touch, temperature/pinprick were assessed and were decreased on the left UE.     Coordination - The patient had normal movements in the hand with no ataxia or dysmetria although slow on the left.  Tremor was absent.   Gait and Station - deferred.    ASSESSMENT/PLAN Ms. Caren GriffinsStacey L Casas is a 52 y.o. female with history of HTN admitted for left sided weakness and speech difficulty. No tPA given due to ICH.    ICH:  right BG ICH and IVH secondary to uncontrolled hypertension CT head right BG small ICH/IVH CTA head and neck right P1 moderate stenosis, no AVM or aneurysm MRI  stable right BG ICH and IVH CT repeat 1/6 pending 2D Echo  EF 60-65% LDL 206 HgbA1c pending UDS neg Heparin subq for VTE prophylaxis No antithrombotic prior to admission, now on No antithrombotic due to ICH Ongoing aggressive stroke risk factor management Therapy recommendations:  CIR Disposition:  pending  Hypertensive emergency Stable on the high end Off cleviprex BP goal < 160 On amlodipine 10 mg daily, metoprolol 25->50 mg twice daily, Cozaar 50->100 mg daily, hydralazine 50->100 mg every 8 hours  Long term BP goal normotensive  Hyperlipidemia Home meds:  none  LDL 206, goal < 70 Now on crestor 40 Continue statin at discharge  Other  Stroke Risk Factors   Other Active Problems Leukocytosis- wbc 13.2->13.5->11.1 Hyponatremia - Na 138->134->133 HA - fioricet PRN x 3 days  Hospital day # 3   Marvel Plan, MD PhD Stroke Neurology 09/19/2022 1:53 PM      To contact Stroke Continuity provider, please refer to [email or web address]. After hours, contact General Neurology

## 2022-09-19 NOTE — Progress Notes (Signed)
[facility]  PROGRESS NOTE  Kelly Stephenson VVO:[id number] DOB: [birth year] DOA: 09/16/2022 PCP: Birdie Sons, MD   Brief HPI:   52 year old female with medical history of hypertension, not taking medications currently who suddenly began struggling with her words and developed sudden onset left-sided weakness.  EMS was called and patient was brought to [facility] where she was found to have a right basal ganglia ICH.  Patient was transferred to [facility].  She was started on Cleviprex with BP goal of SBP less than 160.  Cleviprex being weaned off.    Subjective   Patient seen and examined, complains of headache today.   Assessment/Plan:    Intracerebral hemorrhage -Secondary to uncontrolled hypertension -CT head showed right basal ganglia small ICH/IVH -CT head showed no AVM or aneurysm -Patient started on Cleviprex, to keep SBP less than 160 -Patient to go to CIR  Hypertensive emergency -Patient not on medications at home -She was started on IV Cleviprex with SBP goal of less than 160; Cleviprex has been weaned off -Currently on amlodipine, metoprolol, Cozaar -Also started on hydralazine 50 mg every 8 hours  Hyperlipidemia -LDL 206 -Patient started on Crestor  Headache -Likely in setting of hypotension as above -Will start oxycodone 5 mg every 6 hours as needed  Medications     amLODipine  10 mg Oral Daily   Chlorhexidine Gluconate Cloth  6 each Topical Daily   heparin injection (subcutaneous)  5,000 Units Subcutaneous Q8H   hydrALAZINE  50 mg Oral Q8H   losartan  100 mg Oral Daily   metoprolol tartrate  50 mg Oral BID   pantoprazole  40 mg Oral Daily   rosuvastatin  40 mg Oral Daily   senna-docusate  1 tablet Oral BID     Data Reviewed:   CBG:  Recent Labs  Lab 09/16/22 1333  GLUCAP 127*    SpO2: 97 % O2 Flow Rate (L/min): 4 L/min    Vitals:   09/19/22 0608 09/19/22 0630 09/19/22 0700 09/19/22 0730  BP: (!) 153/98 (!) 109/36 (!) 151/70 (!)  141/62  Pulse: 98 99 92 98  Resp: 14 (!) 22 16 20   Temp:      TempSrc:      SpO2: 94% 98% 93% 97%      Data Reviewed:  Basic Metabolic Panel: Recent Labs  Lab 09/16/22 1356 09/17/22 0454 09/18/22 0412 09/19/22 0444  NA 135 138 134* 133*  K 3.6 3.8 3.5 4.0  CL 103 104 98 101  CO2 20* 24 25 23   GLUCOSE 131* 117* 120* 130*  BUN 15 13 12 17   CREATININE 0.82 0.87 0.79 0.85  CALCIUM 8.9 9.0 8.9 9.1    CBC: Recent Labs  Lab 09/16/22 1356 09/17/22 0454 09/18/22 0412 09/19/22 0444  WBC 10.3 13.2* 13.5* 11.1*  NEUTROABS 7.0  --   --   --   HGB 15.0 14.8 14.8 15.5*  HCT 44.7 41.8 42.6 44.9  MCV 83.2 80.4 81.8 82.8  PLT 293 274 272 278    LFT Recent Labs  Lab 09/16/22 1356  AST 24  ALT 20  ALKPHOS 124  BILITOT 0.9  PROT 7.4  ALBUMIN 3.9     Antibiotics: Anti-infectives (From admission, onward)    None        DVT prophylaxis: Heparin  Code Status: Full code  Family Communication:    CONSULTS neurology   Objective    Physical Examination:   General: Appears in no acute distress Cardiovascular: S1-S2, regular  Respiratory: Clear to auscultation bilaterally Abdomen: Abdomen is soft, nontender, no organomegaly Extremities: No edema in the lower extremities Neurologic: Cranial nerves II through XII grossly intact, no focal deficit noted   Status is: Inpatient:          Meredeth Ide   [facility] If 7PM-7AM, please contact night-coverage at [email or web address], Office  [phone]   09/19/2022, 8:26 AM  LOS: 3 days

## 2022-09-20 ENCOUNTER — Encounter (HOSPITAL_COMMUNITY): Payer: Self-pay | Admitting: Neurology

## 2022-09-20 ENCOUNTER — Inpatient Hospital Stay (HOSPITAL_COMMUNITY)
Admission: RE | Admit: 2022-09-20 | Discharge: 2022-10-13 | DRG: 057 | Disposition: A | Discharge status: Home / Self Care | Payer: BC Managed Care – PPO | Source: Intra-hospital | Attending: Physical Medicine and Rehabilitation | Admitting: Physical Medicine and Rehabilitation

## 2022-09-20 ENCOUNTER — Encounter (HOSPITAL_COMMUNITY): Payer: Self-pay | Admitting: Physical Medicine and Rehabilitation

## 2022-09-20 ENCOUNTER — Other Ambulatory Visit: Payer: Self-pay

## 2022-09-20 DIAGNOSIS — K828 Other specified diseases of gallbladder: Secondary | ICD-10-CM | POA: Diagnosis not present

## 2022-09-20 DIAGNOSIS — I10 Essential (primary) hypertension: Secondary | ICD-10-CM | POA: Diagnosis present

## 2022-09-20 DIAGNOSIS — F419 Anxiety disorder, unspecified: Secondary | ICD-10-CM | POA: Diagnosis present

## 2022-09-20 DIAGNOSIS — I69254 Hemiplegia and hemiparesis following other nontraumatic intracranial hemorrhage affecting left non-dominant side: Secondary | ICD-10-CM

## 2022-09-20 DIAGNOSIS — E785 Hyperlipidemia, unspecified: Secondary | ICD-10-CM | POA: Diagnosis not present

## 2022-09-20 DIAGNOSIS — I69154 Hemiplegia and hemiparesis following nontraumatic intracerebral hemorrhage affecting left non-dominant side: Secondary | ICD-10-CM | POA: Diagnosis not present

## 2022-09-20 DIAGNOSIS — Z833 Family history of diabetes mellitus: Secondary | ICD-10-CM | POA: Diagnosis not present

## 2022-09-20 DIAGNOSIS — A419 Sepsis, unspecified organism: Secondary | ICD-10-CM | POA: Diagnosis not present

## 2022-09-20 DIAGNOSIS — F411 Generalized anxiety disorder: Secondary | ICD-10-CM | POA: Diagnosis not present

## 2022-09-20 DIAGNOSIS — R519 Headache, unspecified: Secondary | ICD-10-CM | POA: Diagnosis not present

## 2022-09-20 DIAGNOSIS — Z823 Family history of stroke: Secondary | ICD-10-CM

## 2022-09-20 DIAGNOSIS — R7989 Other specified abnormal findings of blood chemistry: Secondary | ICD-10-CM | POA: Diagnosis not present

## 2022-09-20 DIAGNOSIS — R7401 Elevation of levels of liver transaminase levels: Secondary | ICD-10-CM | POA: Diagnosis not present

## 2022-09-20 DIAGNOSIS — E538 Deficiency of other specified B group vitamins: Secondary | ICD-10-CM | POA: Diagnosis not present

## 2022-09-20 DIAGNOSIS — R509 Fever, unspecified: Secondary | ICD-10-CM | POA: Diagnosis not present

## 2022-09-20 DIAGNOSIS — M25532 Pain in left wrist: Secondary | ICD-10-CM | POA: Diagnosis not present

## 2022-09-20 DIAGNOSIS — Z818 Family history of other mental and behavioral disorders: Secondary | ICD-10-CM | POA: Diagnosis not present

## 2022-09-20 DIAGNOSIS — I615 Nontraumatic intracerebral hemorrhage, intraventricular: Secondary | ICD-10-CM | POA: Diagnosis not present

## 2022-09-20 DIAGNOSIS — I619 Nontraumatic intracerebral hemorrhage, unspecified: Secondary | ICD-10-CM | POA: Diagnosis not present

## 2022-09-20 DIAGNOSIS — Z8262 Family history of osteoporosis: Secondary | ICD-10-CM

## 2022-09-20 DIAGNOSIS — K760 Fatty (change of) liver, not elsewhere classified: Secondary | ICD-10-CM | POA: Diagnosis present

## 2022-09-20 DIAGNOSIS — D72829 Elevated white blood cell count, unspecified: Secondary | ICD-10-CM | POA: Diagnosis present

## 2022-09-20 DIAGNOSIS — Z8673 Personal history of transient ischemic attack (TIA), and cerebral infarction without residual deficits: Secondary | ICD-10-CM | POA: Diagnosis not present

## 2022-09-20 DIAGNOSIS — Z8249 Family history of ischemic heart disease and other diseases of the circulatory system: Secondary | ICD-10-CM

## 2022-09-20 DIAGNOSIS — F5104 Psychophysiologic insomnia: Secondary | ICD-10-CM | POA: Diagnosis not present

## 2022-09-20 DIAGNOSIS — R109 Unspecified abdominal pain: Secondary | ICD-10-CM | POA: Diagnosis not present

## 2022-09-20 DIAGNOSIS — F482 Pseudobulbar affect: Secondary | ICD-10-CM | POA: Diagnosis not present

## 2022-09-20 DIAGNOSIS — I610 Nontraumatic intracerebral hemorrhage in hemisphere, subcortical: Secondary | ICD-10-CM | POA: Diagnosis not present

## 2022-09-20 DIAGNOSIS — F4325 Adjustment disorder with mixed disturbance of emotions and conduct: Secondary | ICD-10-CM | POA: Diagnosis not present

## 2022-09-20 DIAGNOSIS — R197 Diarrhea, unspecified: Secondary | ICD-10-CM

## 2022-09-20 DIAGNOSIS — Z8679 Personal history of other diseases of the circulatory system: Secondary | ICD-10-CM | POA: Diagnosis present

## 2022-09-20 DIAGNOSIS — L409 Psoriasis, unspecified: Secondary | ICD-10-CM | POA: Diagnosis not present

## 2022-09-20 DIAGNOSIS — I160 Hypertensive urgency: Secondary | ICD-10-CM | POA: Diagnosis present

## 2022-09-20 DIAGNOSIS — F064 Anxiety disorder due to known physiological condition: Secondary | ICD-10-CM | POA: Insufficient documentation

## 2022-09-20 DIAGNOSIS — R112 Nausea with vomiting, unspecified: Secondary | ICD-10-CM | POA: Diagnosis not present

## 2022-09-20 DIAGNOSIS — N179 Acute kidney failure, unspecified: Secondary | ICD-10-CM | POA: Diagnosis present

## 2022-09-20 DIAGNOSIS — E871 Hypo-osmolality and hyponatremia: Secondary | ICD-10-CM | POA: Diagnosis present

## 2022-09-20 DIAGNOSIS — R1110 Vomiting, unspecified: Secondary | ICD-10-CM | POA: Diagnosis not present

## 2022-09-20 DIAGNOSIS — G441 Vascular headache, not elsewhere classified: Secondary | ICD-10-CM | POA: Diagnosis not present

## 2022-09-20 DIAGNOSIS — K6389 Other specified diseases of intestine: Secondary | ICD-10-CM | POA: Diagnosis not present

## 2022-09-20 DIAGNOSIS — G936 Cerebral edema: Secondary | ICD-10-CM | POA: Diagnosis not present

## 2022-09-20 LAB — BASIC METABOLIC PANEL
Anion gap: 10 (ref 5–15)
BUN: 18 mg/dL (ref 6–20)
CO2: 23 mmol/L (ref 22–32)
Calcium: 9.4 mg/dL (ref 8.9–10.3)
Chloride: 101 mmol/L (ref 98–111)
Creatinine, Ser: 0.86 mg/dL (ref 0.44–1.00)
GFR, Estimated: >60 mL/min (ref >60)
Glucose, Bld: 117 mg/dL — ABNORMAL HIGH (ref 70–99)
Potassium: 3.9 mmol/L (ref 3.5–5.1)
Sodium: 134 mmol/L — ABNORMAL LOW (ref 135–145)

## 2022-09-20 LAB — CBC
HCT: 46.1 % — ABNORMAL HIGH (ref 36.0–46.0)
Hemoglobin: 15.4 g/dL — ABNORMAL HIGH (ref 12.0–15.0)
MCH: 27.9 pg (ref 26.0–34.0)
MCHC: 33.4 g/dL (ref 30.0–36.0)
MCV: 83.7 fL (ref 80.0–100.0)
Platelets: 331 10*3/uL (ref 150–400)
RBC: 5.51 MIL/uL — ABNORMAL HIGH (ref 3.87–5.11)
RDW: 13.2 % (ref 11.5–15.5)
WBC: 10.9 10*3/uL — ABNORMAL HIGH (ref 4.0–10.5)
nRBC: 0 % (ref 0.0–0.2)

## 2022-09-20 LAB — HEMOGLOBIN A1C
Hgb A1c MFr Bld: 5.4 % (ref 4.8–5.6)
Mean Plasma Glucose: 108 mg/dL

## 2022-09-20 MED ORDER — GUAIFENESIN-DM 100-10 MG/5ML PO SYRP: 5.0000 mL | ORAL_SOLUTION | Freq: Four times a day (QID) | ORAL | Status: DC | PRN

## 2022-09-20 MED ORDER — METOPROLOL TARTRATE 50 MG PO TABS
50.0000 mg | ORAL_TABLET | Freq: Two times a day (BID) | ORAL | Status: DC
  Administered 2022-09-20 – 2022-10-02 (×21): 50 mg via ORAL
  Filled 2022-09-20 (×23): qty 1

## 2022-09-20 MED ORDER — ALUM & MAG HYDROXIDE-SIMETH 200-200-20 MG/5ML PO SUSP: 30.0000 mL | ORAL | Status: DC | PRN

## 2022-09-20 MED ORDER — HYDRALAZINE HCL 50 MG PO TABS
100.0000 mg | ORAL_TABLET | Freq: Three times a day (TID) | ORAL | Status: DC
  Administered 2022-09-20 – 2022-10-02 (×30): 100 mg via ORAL
  Filled 2022-09-20 (×36): qty 2

## 2022-09-20 MED ORDER — PROCHLORPERAZINE 25 MG RE SUPP: 12.5000 mg | Freq: Four times a day (QID) | RECTAL | Status: DC | PRN

## 2022-09-20 MED ORDER — ENOXAPARIN SODIUM 40 MG/0.4ML IJ SOSY
40.0000 mg | PREFILLED_SYRINGE | INTRAMUSCULAR | Status: DC
  Administered 2022-09-20 – 2022-10-12 (×23): 40 mg via SUBCUTANEOUS
  Filled 2022-09-20 (×23): qty 0.4

## 2022-09-20 MED ORDER — DIPHENHYDRAMINE HCL 12.5 MG/5ML PO ELIX: 12.5000 mg | ORAL_SOLUTION | Freq: Four times a day (QID) | ORAL | Status: DC | PRN

## 2022-09-20 MED ORDER — TRAZODONE HCL 50 MG PO TABS
25.0000 mg | ORAL_TABLET | Freq: Every evening | ORAL | Status: DC | PRN
  Administered 2022-09-20 – 2022-09-28 (×7): 25 mg via ORAL
  Filled 2022-09-20 (×7): qty 1

## 2022-09-20 MED ORDER — BUTALBITAL-APAP-CAFFEINE 50-325-40 MG PO TABS
1.0000 | ORAL_TABLET | Freq: Four times a day (QID) | ORAL | Status: AC | PRN
  Administered 2022-09-20 – 2022-09-22 (×3): 1 via ORAL
  Filled 2022-09-20 (×3): qty 1

## 2022-09-20 MED ORDER — AMLODIPINE BESYLATE 10 MG PO TABS
10.0000 mg | ORAL_TABLET | Freq: Every day | ORAL | Status: DC
  Administered 2022-09-21 – 2022-09-23 (×3): 10 mg via ORAL
  Filled 2022-09-20 (×3): qty 1

## 2022-09-20 MED ORDER — HYDRALAZINE HCL 100 MG PO TABS: 100.0000 mg | ORAL_TABLET | Freq: Three times a day (TID) | ORAL | Status: DC

## 2022-09-20 MED ORDER — ACETAMINOPHEN 325 MG PO TABS
325.0000 mg | ORAL_TABLET | ORAL | Status: DC | PRN
  Administered 2022-09-29 (×2): 650 mg via ORAL
  Filled 2022-09-20 (×4): qty 2

## 2022-09-20 MED ORDER — OXYCODONE HCL 5 MG PO TABS
5.0000 mg | ORAL_TABLET | Freq: Once | ORAL | Status: AC
  Administered 2022-09-20: 5 mg via ORAL
  Filled 2022-09-20: qty 1

## 2022-09-20 MED ORDER — FLUTICASONE PROPIONATE 50 MCG/ACT NA SUSP
1.0000 | Freq: Every day | NASAL | Status: DC
  Administered 2022-09-20 – 2022-10-13 (×22): 1 via NASAL
  Filled 2022-09-20: qty 16

## 2022-09-20 MED ORDER — FLEET ENEMA 7-19 GM/118ML RE ENEM: 1.0000 | ENEMA | Freq: Once | RECTAL | Status: DC | PRN

## 2022-09-20 MED ORDER — OXYCODONE HCL 5 MG PO TABS
5.0000 mg | ORAL_TABLET | ORAL | Status: DC | PRN
  Administered 2022-09-20 – 2022-10-03 (×20): 5 mg via ORAL
  Filled 2022-09-20 (×22): qty 1

## 2022-09-20 MED ORDER — TOPIRAMATE 25 MG PO TABS
50.0000 mg | ORAL_TABLET | Freq: Every day | ORAL | Status: DC
  Administered 2022-09-20: 50 mg via ORAL
  Filled 2022-09-20: qty 2

## 2022-09-20 MED ORDER — METOPROLOL TARTRATE 50 MG PO TABS: 50.0000 mg | ORAL_TABLET | Freq: Two times a day (BID) | ORAL | Status: DC

## 2022-09-20 MED ORDER — TRIAMCINOLONE 0.1 % CREAM:EUCERIN CREAM 1:1
TOPICAL_CREAM | Freq: Three times a day (TID) | CUTANEOUS | Status: DC
  Filled 2022-09-20: qty 1

## 2022-09-20 MED ORDER — SENNOSIDES-DOCUSATE SODIUM 8.6-50 MG PO TABS
2.0000 | ORAL_TABLET | Freq: Every day | ORAL | Status: DC
  Administered 2022-09-21 – 2022-10-09 (×16): 2 via ORAL
  Filled 2022-09-20 (×18): qty 2

## 2022-09-20 MED ORDER — OXYCODONE HCL 5 MG PO TABS: 5.0000 mg | ORAL_TABLET | Freq: Four times a day (QID) | ORAL | 0 refills | Status: DC | PRN

## 2022-09-20 MED ORDER — POLYETHYLENE GLYCOL 3350 17 G PO PACK
17.0000 g | PACK | Freq: Every day | ORAL | Status: DC | PRN
  Administered 2022-09-20 – 2022-09-26 (×2): 17 g via ORAL
  Filled 2022-09-20 (×2): qty 1

## 2022-09-20 MED ORDER — BLOOD PRESSURE CONTROL BOOK
Freq: Once | Status: AC
  Filled 2022-09-20: qty 1

## 2022-09-20 MED ORDER — AMLODIPINE BESYLATE 10 MG PO TABS: 10.0000 mg | ORAL_TABLET | Freq: Every day | ORAL | Status: DC

## 2022-09-20 MED ORDER — PROCHLORPERAZINE EDISYLATE 10 MG/2ML IJ SOLN
10.0000 mg | Freq: Once | INTRAMUSCULAR | Status: AC
  Administered 2022-09-20: 10 mg via INTRAVENOUS
  Filled 2022-09-20: qty 2

## 2022-09-20 MED ORDER — ROSUVASTATIN CALCIUM 20 MG PO TABS
40.0000 mg | ORAL_TABLET | Freq: Every day | ORAL | Status: DC
  Administered 2022-09-21 – 2022-09-29 (×9): 40 mg via ORAL
  Filled 2022-09-20 (×10): qty 2

## 2022-09-20 MED ORDER — ACETAMINOPHEN 325 MG PO TABS: 650.0000 mg | ORAL_TABLET | ORAL | Status: DC | PRN

## 2022-09-20 MED ORDER — LOSARTAN POTASSIUM 100 MG PO TABS: 100.0000 mg | ORAL_TABLET | Freq: Every day | ORAL | Status: DC

## 2022-09-20 MED ORDER — ROSUVASTATIN CALCIUM 40 MG PO TABS: 40.0000 mg | ORAL_TABLET | Freq: Every day | ORAL | Status: DC

## 2022-09-20 MED ORDER — LABETALOL HCL 5 MG/ML IV SOLN: 10.0000 mg | INTRAVENOUS | Status: DC | PRN

## 2022-09-20 MED ORDER — BISACODYL 10 MG RE SUPP
10.0000 mg | Freq: Every day | RECTAL | Status: DC | PRN
  Filled 2022-09-20: qty 1

## 2022-09-20 MED ORDER — PROCHLORPERAZINE EDISYLATE 10 MG/2ML IJ SOLN
5.0000 mg | Freq: Four times a day (QID) | INTRAMUSCULAR | Status: DC | PRN
  Administered 2022-09-29 – 2022-09-30 (×2): 10 mg via INTRAMUSCULAR
  Filled 2022-09-20 (×2): qty 2

## 2022-09-20 MED ORDER — LOSARTAN POTASSIUM 50 MG PO TABS
100.0000 mg | ORAL_TABLET | Freq: Every day | ORAL | Status: DC
  Administered 2022-09-21 – 2022-10-07 (×17): 100 mg via ORAL
  Filled 2022-09-20 (×18): qty 2

## 2022-09-20 MED ORDER — PROCHLORPERAZINE MALEATE 5 MG PO TABS
5.0000 mg | ORAL_TABLET | Freq: Four times a day (QID) | ORAL | Status: DC | PRN
  Administered 2022-09-21 – 2022-09-29 (×2): 10 mg via ORAL
  Administered 2022-10-11: 5 mg via ORAL
  Filled 2022-09-20: qty 2
  Filled 2022-09-20: qty 1
  Filled 2022-09-20 (×2): qty 2

## 2022-09-20 MED ORDER — HYDRALAZINE HCL 20 MG/ML IJ SOLN: 10.0000 mg | INTRAMUSCULAR | Status: DC | PRN

## 2022-09-20 MED ORDER — PANTOPRAZOLE SODIUM 40 MG PO TBEC
40.0000 mg | DELAYED_RELEASE_TABLET | Freq: Every day | ORAL | Status: DC
  Administered 2022-09-21 – 2022-10-13 (×22): 40 mg via ORAL
  Filled 2022-09-20 (×23): qty 1

## 2022-09-20 MED ORDER — LORATADINE 10 MG PO TABS
10.0000 mg | ORAL_TABLET | Freq: Every day | ORAL | Status: DC
  Administered 2022-09-21 – 2022-10-13 (×22): 10 mg via ORAL
  Filled 2022-09-20 (×23): qty 1

## 2022-09-20 NOTE — Discharge Summary (Signed)
Physician Discharge Summary   Patient: Kelly Stephenson MRN: [id number] DOB: 19-Nov-1970  Admit date:     09/16/2022  Discharge date: 09/20/22  Discharge Physician: Meredeth Ide   PCP: Malva Limes, MD   Recommendations at discharge:   Patient to discharge to inpatient rehab Follow-up with neurology as outpatient  Discharge Diagnoses: Principal Problem:   ICH (intracerebral hemorrhage) (HCC)  Resolved Problems:   * No resolved hospital problems. *  Hospital Course: 52 year old female with medical history of hypertension, not taking medications currently who suddenly began struggling with her words and developed sudden onset left-sided weakness.  EMS was called and patient was brought to [facility] where she was found to have a right basal ganglia ICH.  Patient was transferred to [facility].  She was started on Cleviprex with BP goal of SBP less than 160.  Cleviprex being weaned off.   Assessment and Plan:  Intracerebral hemorrhage -Secondary to uncontrolled hypertension -CT head showed right basal ganglia small ICH/IVH -CT head showed no AVM or aneurysm -Patient started on Cleviprex, to keep SBP less than 160; Cleviprex has been weaned off -Patient to go to [facility]   Hypertensive emergency -Patient not on medications at home -She was started on IV Cleviprex with SBP goal of less than 160; Cleviprex has been weaned off -Currently on amlodipine, metoprolol, Cozaar -Also started on hydralazine 50 mg every 8 hours -Continue hydralazine and labetalol as needed IV at rehab   Hyperlipidemia -LDL 206 -Patient started on Crestor   Headache -Likely in setting of hypotension as above -Continue oxycodone 5 mg every 6 hours as needed       Consultants: Neurology Procedures performed:  Disposition: Rehabilitation facility Diet recommendation:  Discharge Diet Orders (From admission, onward)     Start     Ordered   09/20/22 0000  Diet - low sodium heart healthy        09/20/22 1238            Regular diet DISCHARGE MEDICATION: Allergies as of 09/20/2022   No Known Allergies      Medication List     STOP taking these medications    metoprolol succinate 25 MG 24 hr tablet Commonly known as: TOPROL-XL       TAKE these medications    acetaminophen 325 MG tablet Commonly known as: TYLENOL Take 2 tablets (650 mg total) by mouth every 4 (four) hours as needed for mild pain (or temp > 37.5 C (99.5 F)).   amLODipine 10 MG tablet Commonly known as: NORVASC Take 1 tablet (10 mg total) by mouth daily. Start taking on: September 21, 2022 What changed:  medication strength how much to take   hydrALAZINE 100 MG tablet Commonly known as: APRESOLINE Take 1 tablet (100 mg total) by mouth every 8 (eight) hours.   hydrALAZINE 20 MG/ML injection Commonly known as: APRESOLINE Inject 0.5 mLs (10 mg total) into the vein every 4 (four) hours as needed (sbp greater than 150 despite labetolol PRN).   labetalol 5 MG/ML injection Commonly known as: NORMODYNE Inject 2-4 mLs (10-20 mg total) into the vein every 2 (two) hours as needed (SBP >150).   losartan 100 MG tablet Commonly known as: COZAAR Take 1 tablet (100 mg total) by mouth daily. Start taking on: September 21, 2022   metoprolol tartrate 50 MG tablet Commonly known as: LOPRESSOR Take 1 tablet (50 mg total) by mouth 2 (two) times daily.   oxyCODONE 5 MG immediate release tablet Commonly  known as: Oxy IR/ROXICODONE Take 1 tablet (5 mg total) by mouth every 6 (six) hours as needed for severe pain.   rosuvastatin 40 MG tablet Commonly known as: CRESTOR Take 1 tablet (40 mg total) by mouth daily. Start taking on: September 21, 2022        Discharge Exam: Kelly Stephenson Weights   09/20/22 0900  Weight: 92.4 kg   General-appears in no acute distress Heart-S1-S2, regular, no murmur auscultated Lungs-clear to auscultation bilaterally, no wheezing or crackles auscultated Abdomen-soft, nontender, no  organomegaly Extremities-no edema in the lower extremities Neuro-alert, oriented x3, no focal deficit noted  Condition at discharge: good  The results of significant diagnostics from this hospitalization (including imaging, microbiology, ancillary and laboratory) are listed below for reference.   Imaging Studies: CT HEAD WO CONTRAST (5MM)  Result Date: 09/19/2022 CLINICAL DATA:  Intracranial hemorrhage EXAM: CT HEAD WITHOUT CONTRAST TECHNIQUE: Contiguous axial images were obtained from the base of the skull through the vertex without intravenous contrast. RADIATION DOSE REDUCTION: This exam was performed according to the departmental dose-optimization program which includes automated exposure control, adjustment of the mA and/or kV according to patient size and/or use of iterative reconstruction technique. COMPARISON:  CT head 09/16/2022 FINDINGS: Brain: The 1.6 cm AP x 1.0 cm TV x 1.9 cm cc right thalamic hematoma is unchanged in size. Intraventricular extension of the body of the right lateral ventricle is similar to the prior study; however, blood in the third and fourth ventricles is decreased. There is mild surrounding edema with partial effacement of the adjacent right lateral ventricle but no midline shift. The ventricular system is stable in size and configuration without evidence of developing hydrocephalus. There is no new acute intracranial hemorrhage or extra-axial fluid collection. There is no acute territorial infarct. There is no mass lesion. Vascular: No hyperdense vessel or unexpected calcification. Skull: Normal. Negative for fracture or focal lesion. Sinuses/Orbits: Stable. IMPRESSION: 1. Stable size of the right thalamic hematoma with mild surrounding edema but no midline shift, and decreased intraventricular blood 2. No new acute intracranial pathology. Electronically Signed   By: Valetta Mole M.D.   On: 09/19/2022 20:34   ECHOCARDIOGRAM COMPLETE  Result Date: 09/17/2022     ECHOCARDIOGRAM REPORT   Patient Name:   Kelly Stephenson Date of Exam: 09/17/2022 Medical Rec #:  [id number]        Height:       64.0 in Accession #:    [id number]       Weight:       203.7 lb Date of Birth:  11-Aug-1971        BSA:          1.972 m Patient Age:    59 years         BP:           125/79 mmHg Patient Gender: F                HR:           86 bpm. Exam Location:  Inpatient Procedure: 2D Echo, Cardiac Doppler and Color Doppler Indications:    Stroke  History:        Patient has no prior history of Echocardiogram examinations.                 Risk Factors:Hypertension.  Sonographer:    Clayton Lefort RDCS (AE) Referring Phys: Altamease Oiler DE LA TORRE  Sonographer Comments: Suboptimal subcostal window. IMPRESSIONS  1. Left ventricular ejection  fraction, by estimation, is 60 to 65%. The left ventricle has normal function. The left ventricle has no regional wall motion abnormalities. There is mild left ventricular hypertrophy. Left ventricular diastolic parameters are consistent with Grade I diastolic dysfunction (impaired relaxation).  2. Right ventricular systolic function is normal. The right ventricular size is mildly enlarged.  3. The mitral valve is abnormal. Trivial mitral valve regurgitation. No evidence of mitral stenosis.  4. The aortic valve is tricuspid. Aortic valve regurgitation is not visualized. No aortic stenosis is present. Comparison(s): No prior Echocardiogram. FINDINGS  Left Ventricle: Left ventricular ejection fraction, by estimation, is 60 to 65%. The left ventricle has normal function. The left ventricle has no regional wall motion abnormalities. The left ventricular internal cavity size was normal in size. There is  mild left ventricular hypertrophy. Left ventricular diastolic parameters are consistent with Grade I diastolic dysfunction (impaired relaxation). Right Ventricle: The right ventricular size is mildly enlarged. No increase in right ventricular wall thickness. Right ventricular  systolic function is normal. Left Atrium: Left atrial size was normal in size. Right Atrium: Right atrial size was normal in size. Pericardium: There is no evidence of pericardial effusion. Mitral Valve: The mitral valve is abnormal. Mild to moderate mitral annular calcification. Trivial mitral valve regurgitation. No evidence of mitral valve stenosis. Tricuspid Valve: The tricuspid valve is not well visualized. Tricuspid valve regurgitation is not demonstrated. No evidence of tricuspid stenosis. Aortic Valve: The aortic valve is tricuspid. Aortic valve regurgitation is not visualized. No aortic stenosis is present. Aortic valve mean gradient measures 4.0 mmHg. Aortic valve peak gradient measures 7.7 mmHg. Aortic valve area, by VTI measures 2.42 cm. Pulmonic Valve: The pulmonic valve was not well visualized. Pulmonic valve regurgitation is trivial. No evidence of pulmonic stenosis. Aorta: The aortic root is normal in size and structure. Venous: The inferior vena cava was not well visualized. IAS/Shunts: The interatrial septum was not well visualized.  LEFT VENTRICLE PLAX 2D LVIDd:         4.30 cm   Diastology LVIDs:         2.90 cm   LV e' medial:    6.64 cm/s LV PW:         1.20 cm   LV E/e' medial:  7.9 LV IVS:        1.30 cm   LV e' lateral:   6.20 cm/s LVOT diam:     2.00 cm   LV E/e' lateral: 8.5 LV SV:         62 LV SV Index:   31 LVOT Area:     3.14 cm  RIGHT VENTRICLE RV Basal diam:  3.80 cm RV Mid diam:    3.50 cm RV S prime:     14.50 cm/s TAPSE (M-mode): 2.3 cm LEFT ATRIUM             Index        RIGHT ATRIUM           Index LA diam:        3.10 cm 1.57 cm/m   RA Area:     18.40 cm LA Vol (A2C):   34.1 ml 17.29 ml/m  RA Volume:   53.70 ml  27.24 ml/m LA Vol (A4C):   22.1 ml 11.21 ml/m LA Biplane Vol: 27.7 ml 14.05 ml/m  AORTIC VALVE AV Area (Vmax):    2.42 cm AV Area (Vmean):   2.38 cm AV Area (VTI):     2.42 cm AV  Vmax:           139.00 cm/s AV Vmean:          93.000 cm/s AV VTI:             0.254 m AV Peak Grad:      7.7 mmHg AV Mean Grad:      4.0 mmHg LVOT Vmax:         107.00 cm/s LVOT Vmean:        70.600 cm/s LVOT VTI:          0.196 m LVOT/AV VTI ratio: 0.77  AORTA Ao Root diam: 2.90 cm Ao Asc diam:  3.00 cm MITRAL VALVE MV Area (PHT): 2.77 cm    SHUNTS MV Decel Time: 274 msec    Systemic VTI:  0.20 m MV E velocity: 52.50 cm/s  Systemic Diam: 2.00 cm MV A velocity: 68.80 cm/s MV E/A ratio:  0.76 Vishnu Priya Mallipeddi Electronically signed by Lorelee Cover Mallipeddi Signature Date/Time: 09/17/2022/11:49:50 AM    Final    CT HEAD WO CONTRAST (5MM)  Result Date: 09/16/2022 CLINICAL DATA:  Headache, sudden, severe Hydrocephalus EXAM: CT HEAD WITHOUT CONTRAST TECHNIQUE: Contiguous axial images were obtained from the base of the skull through the vertex without intravenous contrast. RADIATION DOSE REDUCTION: This exam was performed according to the departmental dose-optimization program which includes automated exposure control, adjustment of the mA and/or kV according to patient size and/or use of iterative reconstruction technique. COMPARISON:  1:28 p.m. FINDINGS: Brain: 12 x 17 x 20 mm (volume = 2.1 mL) hematoma within the right caudate body/superior thalamus is stable with extension of hemorrhage into the right lateral ventricle, third and fourth ventricle again noted. Ventricular size is normal and stable. No significant mass effect or midline shift. No acute infarct. Cerebellum is unremarkable. Vascular: No hyperdense vessel or unexpected calcification. Skull: Normal. Negative for fracture or focal lesion. Sinuses/Orbits: Visualized orbits are unremarkable. Paranasal sinuses are clear. Other: Mastoid air cells and middle ear cavities are clear. IMPRESSION: 1. Stable right caudate body/superior thalamus hematoma with extension of hemorrhage into the right lateral ventricle, third and fourth ventricle. Ventricular size is normal and stable. No significant mass effect or midline shift. 2. No  acute infarct. Electronically Signed   By: Fidela Salisbury M.D.   On: 09/16/2022 21:44   MR BRAIN W WO CONTRAST  Result Date: 09/16/2022 CLINICAL DATA:  Nonhemorrhagic stroke. EXAM: MRI HEAD WITHOUT AND WITH CONTRAST TECHNIQUE: Multiplanar, multiecho pulse sequences of the brain and surrounding structures were obtained without and with intravenous contrast. CONTRAST:  9.41mL GADAVIST GADOBUTROL 1 MMOL/ML IV SOLN COMPARISON:  Head CT and CTA from earlier today FINDINGS: Brain: Known acute hematoma in the right thalamus and adjacent white matter with superior extension into the caudate body and right lateral ventricle. Blood clot reaches the fourth ventricle, no detected change from earlier CT. Small area of shine through in the left periventricular white matter, other areas of mild diffusion hyperintensity are related to susceptibility artifact from intraventricular clot. White matter disease with notable periventricular ovoid pattern at the left lateral ventricle and hazy T2 hyperintensity in the right pons, demyelinating disease is considered. No abnormal enhancement at the level of the hemorrhage. No masslike finding throughout the brain. Vascular: Major flow voids and vascular enhancements are preserved. There was preceding CTA. Skull and upper cervical spine: Normal marrow signal. Sinuses/Orbits: Negative. IMPRESSION: 1. No lesion or infarct seen underlying the right thalamic hematoma. No detected rebleeding and no hydrocephalus. 2.  Areas of T2 hyperintensity with periventricular and pontine involvement, question symptoms of multiple sclerosis. Given the atheromatous changes by CTA this may instead reflect premature small vessel disease in this patient with hypertension. Electronically Signed   By: Jorje Guild M.D.   On: 09/16/2022 19:03   CT ANGIO HEAD NECK W WO CM  Result Date: 09/16/2022 CLINICAL DATA:  Neuro deficit, acute, stroke suspected. Right basal ganglia hemorrhage. Rule out AVM. EXAM: CT  ANGIOGRAPHY HEAD AND NECK TECHNIQUE: Multidetector CT imaging of the head and neck was performed using the standard protocol during bolus administration of intravenous contrast. Multiplanar CT image reconstructions and MIPs were obtained to evaluate the vascular anatomy. Carotid stenosis measurements (when applicable) are obtained utilizing NASCET criteria, using the distal internal carotid diameter as the denominator. RADIATION DOSE REDUCTION: This exam was performed according to the departmental dose-optimization program which includes automated exposure control, adjustment of the mA and/or kV according to patient size and/or use of iterative reconstruction technique. CONTRAST:  22mL OMNIPAQUE IOHEXOL 350 MG/ML SOLN COMPARISON:  None Available. FINDINGS: CTA NECK FINDINGS Aortic arch: Standard 3 vessel aortic arch. Wide patency of the brachiocephalic and subclavian arteries. Right carotid system: Patent without evidence of stenosis or dissection. Left carotid system: Patent with mild atheromatous wall thickening of the mid common carotid artery. No evidence of a significant stenosis or dissection. Vertebral arteries: Patent without evidence of stenosis or dissection. Strongly dominant left vertebral artery. Skeleton: Mild cervical spondylosis. Other neck: No evidence of cervical lymphadenopathy or mass. Upper chest: Clear lung apices. Review of the MIP images confirms the above findings CTA HEAD FINDINGS Anterior circulation: The internal carotid arteries are patent from skull base to carotid termini with mild atherosclerotic irregularity but no significant stenosis. ACAs and MCAs are patent with mild branch vessel irregularity but no evidence of a proximal branch occlusion or significant proximal stenosis. No aneurysm or vascular malformation is identified, and there is no spot sign within the right basal ganglia hemorrhage. Posterior circulation: The intracranial vertebral arteries are widely patent to the  basilar. Patent PICA, AICA, and SCA origins are seen bilaterally. The basilar artery is patent with mild atherosclerotic irregularity but no significant stenosis. There is a moderate-sized right posterior communicating artery. Moderate right P1 and mild bilateral P2 stenoses are present. No aneurysm is identified. Venous sinuses: Poorly evaluated due to arterial contrast timing. Anatomic variants: None. Review of the MIP images confirms the above findings IMPRESSION: 1. No evidence of a vascular malformation. 2. Intracranial atherosclerosis including moderate right P1 and mild bilateral P2 stenoses. 3. Widely patent carotid and vertebral arteries. Electronically Signed   By: Logan Bores M.D.   On: 09/16/2022 14:17   CT HEAD CODE STROKE WO CONTRAST  Result Date: 09/16/2022 CLINICAL DATA:  Code stroke.  Neuro deficit, acute, stroke suspected EXAM: CT HEAD WITHOUT CONTRAST TECHNIQUE: Contiguous axial images were obtained from the base of the skull through the vertex without intravenous contrast. RADIATION DOSE REDUCTION: This exam was performed according to the departmental dose-optimization program which includes automated exposure control, adjustment of the mA and/or kV according to patient size and/or use of iterative reconstruction technique. COMPARISON:  None Available. FINDINGS: Brain: Approximately 1.6 x 1.4 x 2.0 cm acute hemorrhage (estimated volume of 2.2 mL) in the right basal ganglia extending and overlying corona radiata. Intraventricular extension of hemorrhage into the right lateral ventricle, third ventricle, and fourth ventricle. No hydrocephalus at this time. No midline shift. No evidence of acute large vascular territory infarct. Vascular:  No hyperdense vessel identified. Skull: No acute fracture. Sinuses/Orbits: Mild right sphenoid sinus mucosal thickening. Other: No mastoid effusions. ASPECTS [facility] Stroke Program Early CT Score) IMPRESSION: Acute hemorrhage in the right basal ganglia extend  overlying corona radiata, described above. Intraventricular extension of hemorrhage. Findings discussed with Dr. Quinn Axe at 1:34 PM via telephone. Electronically Signed   By: Margaretha Sheffield M.D.   On: 09/16/2022 13:35    Microbiology: Results for orders placed or performed during the hospital encounter of 09/16/22  MRSA Next Gen by PCR, Nasal     Status: None   Collection Time: 09/16/22  4:30 PM   Specimen: Nasal Mucosa; Nasal Swab  Result Value Ref Range Status   MRSA by PCR Next Gen NOT DETECTED NOT DETECTED Final    Comment: (NOTE) The GeneXpert MRSA Assay (FDA approved for NASAL specimens only), is one component of a comprehensive MRSA colonization surveillance program. It is not intended to diagnose MRSA infection nor to guide or monitor treatment for MRSA infections. Test performance is not FDA approved in patients less than 36 years old. Performed at [facility], [place] [address] [place], [place] [address]   Resp panel by RT-PCR (RSV, Flu A&B, Covid) Anterior Nasal Swab     Status: None   Collection Time: 09/16/22  4:30 PM   Specimen: Anterior Nasal Swab  Result Value Ref Range Status   SARS Coronavirus 2 by RT PCR NEGATIVE NEGATIVE Final    Comment: (NOTE) SARS-CoV-2 target nucleic acids are NOT DETECTED.  The SARS-CoV-2 RNA is generally detectable in upper respiratory specimens during the acute phase of infection. The lowest concentration of SARS-CoV-2 viral copies this assay can detect is 138 copies/mL. A negative result does not preclude SARS-Cov-2 infection and should not be used as the sole basis for treatment or other patient management decisions. A negative result may occur with  improper specimen collection/handling, submission of specimen other than nasopharyngeal swab, presence of viral mutation(s) within the areas targeted by this assay, and inadequate number of viral copies(<138 copies/mL). A negative result must be combined with clinical observations,  patient history, and epidemiological information. The expected result is Negative.  Fact Sheet for Patients:  EntrepreneurPulse.com.au  Fact Sheet for Healthcare Providers:  IncredibleEmployment.be  This test is no t yet approved or cleared by the [place] FDA and  has been authorized for detection and/or diagnosis of SARS-CoV-2 by FDA under an Emergency Use Authorization (EUA). This EUA will remain  in effect (meaning this test can be used) for the duration of the COVID-19 declaration under Section 564(b)(1) of the Act, 21 U.S.C.section 360bbb-3(b)(1), unless the authorization is terminated  or revoked sooner.       Influenza A by PCR NEGATIVE NEGATIVE Final   Influenza B by PCR NEGATIVE NEGATIVE Final    Comment: (NOTE) The Xpert Xpress SARS-CoV-2/FLU/RSV plus assay is intended as an aid in the diagnosis of influenza from Nasopharyngeal swab specimens and should not be used as a sole basis for treatment. Nasal washings and aspirates are unacceptable for Xpert Xpress SARS-CoV-2/FLU/RSV testing.  Fact Sheet for Patients: EntrepreneurPulse.com.au  Fact Sheet for Healthcare Providers: IncredibleEmployment.be  This test is not yet approved or cleared by the [place] FDA and has been authorized for detection and/or diagnosis of SARS-CoV-2 by FDA under an Emergency Use Authorization (EUA). This EUA will remain in effect (meaning this test can be used) for the duration of the COVID-19 declaration under Section 564(b)(1) of the Act, 21 U.S.C. section  360bbb-3(b)(1), unless the authorization is terminated or revoked.     Resp Syncytial Virus by PCR NEGATIVE NEGATIVE Final    Comment: (NOTE) Fact Sheet for Patients: EntrepreneurPulse.com.au  Fact Sheet for Healthcare Providers: IncredibleEmployment.be  This test is not yet approved or cleared by the [place] FDA and has been authorized for detection and/or diagnosis of SARS-CoV-2 by FDA under an Emergency Use Authorization (EUA). This EUA will remain in effect (meaning this test can be used) for the duration of the COVID-19 declaration under Section 564(b)(1) of the Act, 21 U.S.C. section 360bbb-3(b)(1), unless the authorization is terminated or revoked.  Performed at [facility], [place] [address] [place], [place] [address]     Labs: CBC: Recent Labs  Lab 09/16/22 1356 09/17/22 0454 09/18/22 0412 09/19/22 0444 09/20/22 0423  WBC 10.3 13.2* 13.5* 11.1* 10.9*  NEUTROABS 7.0  --   --   --   --   HGB 15.0 14.8 14.8 15.5* 15.4*  HCT 44.7 41.8 42.6 44.9 46.1*  MCV 83.2 80.4 81.8 82.8 83.7  PLT 293 274 272 278 [id number]   Basic Metabolic Panel: Recent Labs  Lab 09/16/22 1356 09/17/22 0454 09/18/22 0412 09/19/22 0444 09/20/22 0423  NA 135 138 134* 133* 134*  K 3.6 3.8 3.5 4.0 3.9  CL 103 104 98 101 101  CO2 20* 24 25 23 23   GLUCOSE 131* 117* 120* 130* 117*  BUN 15 13 12 17 18   CREATININE 0.82 0.87 0.79 0.85 0.86  CALCIUM 8.9 9.0 8.9 9.1 9.4   Liver Function Tests: Recent Labs  Lab 09/16/22 1356  AST 24  ALT 20  ALKPHOS 124  BILITOT 0.9  PROT 7.4  ALBUMIN 3.9   CBG: Recent Labs  Lab 09/16/22 1333  GLUCAP 127*    Discharge time spent: greater than 30 minutes.  Signed: Oswald Hillock, MD [facility] 09/20/2022

## 2022-09-20 NOTE — H&P (Signed)
Expand All Collapse All      Physical Medicine and Rehabilitation Admission H&P     CC: L hemiparesis     HPI: Kelly Stephenson is a 52 year old female with history of HTN--no meds X "years", chronic right shoulder pain (took aleve 4 pills/day) who was admitted on 09/16/22 from work with sudden onset of left facial droop, LUE/LLE weakness with decreased sensation and problems speaking. BP 210/110 at admission and CT head done showing acute hemorrhage in right basal ganglia with extending and overlying corona radiata with extension into right lateral, 3rd and 4th ventricles without hydro. CTA negative for vascular malformation and showed patent vessels. She was started on Cleveprex with SBP goal 130-150 and transferred to [facility] for management. Follow up MRI brain showed no lesion or infarct underlying thalamic hematoma and areas of T2 hyperintensity with periventricular and pontine involvement--question symptoms of MS v/s small vessel disease.   2D echo showed EF 60-65% with no wall abnormality and mild right ventricular wall thickness.    Dr. Roda Shutters felt that stroke was secondary to uncontrolled HTN and oral meds added for better control. Lethargy resolving. She needed supplemental oxygen due to issues with hypoxia and reports difficulty breathing felt to be due to allergies. She has had issues with N/V and developed severe headaches. Last CT head 01/08 showed stable 1.6 X 1.0 X 1.9 cm decrease in IVH and mild surrounding edema with partial effacement of right lateral ventricle without shift or developing hydrocephalus.       Per chart, had BP 180s/110s in 2022 and was prescribed Metoprolol- per her husband, "she wasn't a medicine person" and so appeared she didn't fill the medicine.   Fioricet nor Oxycodone have been real helpful for headaches- using cold compresses and keeping eyes closed.  Using [facility] for voiding, but pt prefers the bedpan- nursing trying to make her use BSC.  Per husband/mother,  hasn't eaten much since admission- no BM since CVA 1/5- pt denies constipation.        Review of Systems  Constitutional:  Negative for fever.  HENT:  Positive for sinus pain. Negative for hearing loss.   Eyes:  Positive for photophobia.  Respiratory:  Negative for shortness of breath.   Cardiovascular:  Negative for chest pain and palpitations.  Gastrointestinal:  Positive for constipation (has not had a BM since admission--usually has one daily) and nausea.  Musculoskeletal:  Positive for joint pain (right shoulder pain/due to tennis).  Skin:  Positive for rash (right ankle, left great toe, left elbow).  Neurological:  Positive for dizziness (with therapy), sensory change, focal weakness and headaches.  All other systems reviewed and are negative.         Past Medical History:  Diagnosis Date   Hypertension             Past Surgical History:  Procedure Laterality Date   ABDOMINAL HYSTERECTOMY       CESAREAN SECTION               Family History  Problem Relation Age of Onset   Osteoporosis Mother     High blood pressure Father     Heart Problems Father     Diabetes Father     Hypertension Father     Stroke Father     Heart attack Father     Depression Sister     Diabetes Sister     Hypertension Sister     Hypertension Maternal Aunt  Obesity Maternal Aunt     Hypertension Maternal Uncle     Kidney disease Maternal Uncle     Glaucoma Maternal Uncle     Stroke Paternal Uncle     Stroke Maternal Grandfather     Stroke Other        Social History:  Married. Has 52 year old son who is home schooled.  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 Plays tennis 4 times a week. She reports that she has never smoked. She has never used smokeless tobacco. She reports current alcohol use of about 1.0 standard drink of alcohol per week. She reports that she does not use drugs.     Allergies: No Known Allergies           Medications Prior to Admission  Medication Sig  Dispense Refill   amLODipine (NORVASC) 5 MG tablet Take 1 tablet (5 mg total) by mouth daily. (Patient not taking: Reported on 09/17/2022) 30 tablet 3   metoprolol succinate (TOPROL-XL) 25 MG 24 hr tablet Take 1 tablet (25 mg total) by mouth daily. (Patient not taking: Reported on 09/17/2022) 90 tablet 3          Home: Home Living Family/patient expects to be discharged to:: Private residence Living Arrangements: Spouse/significant other, Children (17yo son who is home schooled) Available Help at Discharge: Family, Available 24 hours/day Type of Home: House Home Access: Stairs to enter Secretary/administratorntrance Stairs-Number of Steps: 3 Entrance Stairs-Rails: None Home Layout: Able to live on main level with bedroom/bathroom Bathroom Shower/Tub: Tub/shower unit, Health visitorWalk-in shower Bathroom Toilet: Pharmacist, communitytandard Bathroom Accessibility: Yes Home Equipment: None  Lives With: Spouse   Functional History: Prior Function Prior Level of Function : Independent/Modified Independent, Driving, Working/employed Mobility Comments: indep ADLs Comments: indep   Functional Status:  Mobility: Bed Mobility Overal bed mobility: Needs Assistance Bed Mobility: Supine to Sit Supine to sit: Min assist, HOB elevated General bed mobility comments: pt with good effort to try and assist with R UE using bed rail, assist with bringing hips to EOB using bed pad, limited functional use from L UE (inattention and was kind of "left behind") Transfers Overall transfer level: Needs assistance Equipment used: Rolling walker (2 wheels), None (face to face transfer with use of gait belt) Transfers: Sit to/from Stand, Bed to chair/wheelchair/BSC Sit to Stand: Min assist Bed to/from chair/wheelchair/BSC transfer type:: Step pivot Step pivot transfers: Mod assist General transfer comment: good power up with min A, focused on multimodal cues for LLE activation for supporting RLE during steps, mod A to [facility] face tre[facility]iqure,  mod a to step pivot to recliner with RW Ambulation/Gait General Gait Details: unable this date   ADL: ADL Overall ADL's : Needs assistance/impaired Eating/Feeding: Moderate assistance, Sitting Eating/Feeding Details (indicate cue type and reason): for Bil tasks like cutting, but able to self-feed afterwards Grooming: Wash/dry face, Wash/dry hands, Minimal assistance, Sitting Grooming Details (indicate cue type and reason): cues to engage LUE Upper Body Bathing: Minimal assistance, Sitting Lower Body Bathing: Moderate assistance, Sit to/from stand Upper Body Dressing : Moderate assistance, Sitting Upper Body Dressing Details (indicate cue type and reason): to don second gown Lower Body Dressing: Moderate assistance, Sitting/lateral leans Lower Body Dressing Details (indicate cue type and reason): able to perform figure 4 and use R hand to don sock on L foot - LUE attempting to assist , weak pincher grasp and so futile at [employer] stime Toilet Transfer: Moderate assistance, +2 for physical assistance, +2  for safety/equipment Toilet Transfer Details (indicate cue type and reason): to the R, min A for stand, mod for pivot with multimodal cues for LLE to straighten/activate Toileting- Clothing Manipulation and Hygiene: Moderate assistance, +2 for safety/equipment, Sit to/from stand Toileting - Clothing Manipulation Details (indicate cue type and reason): able to stand with min A +2 and with OT pulling aside gown, able to perform front peri care in standing Functional mobility during ADLs: Moderate assistance, +2 for physical assistance, +2 for safety/equipment, Cueing for safety, Cueing for sequencing (SPT only) General ADL Comments: L inattention, deficits in balance, cognition, coordination, proprioception all impacting ADL and functional transfers   Cognition: Cognition Overall Cognitive Status: Impaired/Different from baseline Arousal/Alertness: Awake/alert Orientation Level: Oriented to  person, Oriented to place, Disoriented to time, Oriented to situation Year: 2023 Month: January Day of Week: Incorrect Attention: Focused, Sustained Focused Attention: Impaired Focused Attention Impairment: Verbal basic Sustained Attention: Impaired Sustained Attention Impairment: Verbal basic Memory: Impaired Memory Impairment: Retrieval deficit, Decreased recall of new information Awareness: Impaired Awareness Impairment: Emergent impairment Executive Function: Initiating Initiating: Impaired Initiating Impairment: Verbal basic, Functional basic Behaviors: Restless, Poor frustration tolerance Safety/Judgment: Impaired Cognition Arousal/Alertness: Awake/alert Behavior During Therapy: Flat affect (and impulsive) Overall Cognitive Status: Impaired/Different from baseline Area of Impairment: Memory, Safety/judgement, Problem solving Memory: Decreased short-term memory Safety/Judgement: Decreased awareness of safety, Decreased awareness of deficits Problem Solving: Difficulty sequencing, Requires verbal cues, Requires tactile cues General Comments: somewhat lethagic throughout, needing cues to keep eyes open     Blood pressure (!) 142/81, pulse 90, temperature 97.6 F (36.4 C), temperature source Oral, resp. rate 20, height 5\' 4"  (1.626 m), weight 92.4 kg, SpO2 96 %. Physical Exam Vitals and nursing note reviewed. Exam conducted with a chaperone present.  Constitutional:      General: She is not in acute distress.    Appearance: She is obese.     Comments: Lying in darkened room. Restless and clutching her head at times. Kept eyes closed for entire exam except (EOM)   Pt sleeping initially, but husband and mother at bedside; on side but finally woke to participate in exam- didn't open eyes except for EOM exam, appears to be in pain, but no acute distress  HENT:     Head: Normocephalic and atraumatic.     Comments: No facial droop seen at rest No tongue deviation    Right Ear:  External ear normal.     Left Ear: External ear normal.     Nose: Nose normal. No congestion.     Mouth/Throat:     Mouth: Mucous membranes are dry.     Pharynx: Oropharynx is clear. No oropharyngeal exudate.  Eyes:     General:        Right eye: No discharge.        Left eye: No discharge.     Extraocular Movements: Extraocular movements intact.  Cardiovascular:     Rate and Rhythm: Normal rate and regular rhythm.     Heart sounds: Normal heart sounds. No murmur heard.    No gallop.  Pulmonary:     Effort: Pulmonary effort is normal. No respiratory distress.     Breath sounds: Normal breath sounds. No wheezing, rhonchi or rales.  Abdominal:     General: There is no distension.     Palpations: Abdomen is soft.     Comments: Pt abd is soft, NT, protuberant but not distended per pt- hypoactive BS  Musculoskeletal:     Cervical back: Neck  supple. No tenderness.     Comments: RUE 5/5 LUE- biceps 4-/5; triceps 3+/5; Grip 3+/5; FA 2/5 RLE- 5/5 LLE- HF 2/5; KE/KF 2/5; DF/PF 1/5   Skin:    General: Skin is warm and dry.     Comments: Psoriatic patches left great toe, right lateral ankle and left elbow.   Neurological:     Mental Status: She is oriented to person, place, and time and easily aroused. She is lethargic.     Comments: Speech clear. Was able to state month, year, DOB but not day. Restless and internally distracted by pain and fatigue (lack of sleep but chronic). Left sided weakness with sensory deficits and intention. Left foot drop noted.  Sensation intact to light touch in all 4 extremities and face Hard to assess inattention, etc since so sleepy/tired  Psychiatric:     Comments: Flat, sleepy due to pain meds per family        Lab Results Last 48 Hours        Results for orders placed or performed during the hospital encounter of 09/16/22 (from the past 48 hour(s))  Basic metabolic panel     Status: Abnormal    Collection Time: 09/19/22  4:44 AM  Result Value Ref  Range    Sodium 133 (L) 135 - 145 mmol/L    Potassium 4.0 3.5 - 5.1 mmol/L    Chloride 101 98 - 111 mmol/L    CO2 23 22 - 32 mmol/L    Glucose, Bld 130 (H) 70 - 99 mg/dL      Comment: Glucose reference range applies only to samples taken after fasting for at least 8 hours.    BUN 17 6 - 20 mg/dL    Creatinine, Ser 9.52 0.44 - 1.00 mg/dL    Calcium 9.1 8.9 - 84.1 mg/dL    GFR, Estimated >32 >44 mL/min      Comment: (NOTE) Calculated using the CKD-EPI Creatinine Equation (2021)      Anion gap 9 5 - 15      Comment: Performed at [facility], [address], [place], [place] [address]  CBC     Status: Abnormal    Collection Time: 09/19/22  4:44 AM  Result Value Ref Range    WBC 11.1 (H) 4.0 - 10.5 K/uL    RBC 5.42 (H) 3.87 - 5.11 MIL/uL    Hemoglobin 15.5 (H) 12.0 - 15.0 g/dL    HCT 25.3 66.4 - 40.3 %    MCV 82.8 80.0 - 100.0 fL    MCH 28.6 26.0 - 34.0 pg    MCHC 34.5 30.0 - 36.0 g/dL    RDW 47.4 25.9 - 56.3 %    Platelets 278 150 - 400 K/uL    nRBC 0.0 0.0 - 0.2 %      Comment: Performed at [facility], [address] [place], [place] [address]  Basic metabolic panel     Status: Abnormal    Collection Time: 09/20/22  4:23 AM  Result Value Ref Range    Sodium 134 (L) 135 - 145 mmol/L    Potassium 3.9 3.5 - 5.1 mmol/L    Chloride 101 98 - 111 mmol/L    CO2 23 22 - 32 mmol/L    Glucose, Bld 117 (H) 70 - 99 mg/dL      Comment: Glucose reference range applies only to samples taken after fasting for at least 8 hours.    BUN 18 6 -  20 mg/dL    Creatinine, Ser 3.71 0.44 - 1.00 mg/dL    Calcium 9.4 8.9 - 06.2 mg/dL    GFR, Estimated >69 >48 mL/min      Comment: (NOTE) Calculated using the CKD-EPI Creatinine Equation (2021)      Anion gap 10 5 - 15      Comment: Performed at [facility], [address], [place], [place] [address]  CBC     Status: Abnormal    Collection Time: 09/20/22  4:23 AM  Result Value Ref Range    WBC 10.9 (H) 4.0 - 10.5 K/uL    RBC  5.51 (H) 3.87 - 5.11 MIL/uL    Hemoglobin 15.4 (H) 12.0 - 15.0 g/dL    HCT 03.5 (H) 00.9 - 46.0 %    MCV 83.7 80.0 - 100.0 fL    MCH 27.9 26.0 - 34.0 pg    MCHC 33.4 30.0 - 36.0 g/dL    RDW 38.1 82.9 - 93.7 %    Platelets 331 150 - 400 K/uL    nRBC 0.0 0.0 - 0.2 %      Comment: Performed at [facility], [address] [place], [place] [address]       Imaging Results (Last 48 hours)  CT HEAD WO CONTRAST ( )   Result Date: 09/19/2022 CLINICAL DATA:  Intracranial hemorrhage EXAM: CT HEAD WITHOUT CONTRAST TECHNIQUE: Contiguous axial images were obtained from the base of the skull through the vertex without intravenous contrast. RADIATION DOSE REDUCTION: This exam was performed according to the departmental dose-optimization program which includes automated exposure control, adjustment of the mA and/or kV according to patient size and/or use of iterative reconstruction technique. COMPARISON:  CT head 09/16/2022 FINDINGS: Brain: The 1.6 cm AP x 1.0 cm TV x 1.9 cm cc right thalamic hematoma is unchanged in size. Intraventricular extension of the body of the right lateral ventricle is similar to the prior study; however, blood in the third and fourth ventricles is decreased. There is mild surrounding edema with partial effacement of the adjacent right lateral ventricle but no midline shift. The ventricular system is stable in size and configuration without evidence of developing hydrocephalus. There is no new acute intracranial hemorrhage or extra-axial fluid collection. There is no acute territorial infarct. There is no mass lesion. Vascular: No hyperdense vessel or unexpected calcification. Skull: Normal. Negative for fracture or focal lesion. Sinuses/Orbits: Stable. IMPRESSION: 1. Stable size of the right thalamic hematoma with mild surrounding edema but no midline shift, and decreased intraventricular blood 2. No new acute intracranial pathology. Electronically Signed   By: Lesia Hausen M.D.    On: 09/19/2022 20:34           Blood pressure (!) 142/81, pulse 90, temperature 97.6 F (36.4 C), temperature source Oral, resp. rate 20, height 5\' 4"  (1.626 m), weight 92.4 kg, SpO2 96 %.   Medical Problem List and Plan: 1. 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  Antithrombotics: -DVT/anticoagulation:  Pharmaceutical: Lovenox             -antiplatelet therapy: N/A 3. Pain Management:  On oxycodone and Fioricet prn.              --will start sleep chart.  Add topamax             If topamax doesn't work, can try Elavil? 4. Mood/Behavior/Sleep: LCSW to follow for evaluation and support.  --Has a lot of stress, anxiety, insomnia-->hasn't wanted to get help per husband.  --chronic insomnia-->up/down multiple times during the night. --antipsychotic agents: N/A 5. Neuropsych/cognition: This patient is capable of making decisions on her own behalf. 6. Skin/Wound Care: Routine pressure relief measures.  7. Fluids/Electrolytes/Nutrition: Monitor I/O. Check CMET in am. 8. HTN: Monitor BP TID. Was not compliant with medications--does not want meds/doesn't like meds, so was noncompliant at home with meds prescribed.  --Multiple meds added and titrated. Orthostatic symptoms reported.  --On Norvasc, Cozaar, Hydralazine and Lopressor.             --SBP goal 130-150 range.  9. Hyperlipidemia: LDL 206- on Crestor. 10. Hyponatremia: Question SIADH. Monitor I/O. Check CMET in am.  11. Leucocytosis: Likely reactive--continue to trend. 10.3-->13.5-->10.9. Tmax-99.2  --Monitor for fevers and other signs of infection.  12. Acute on chronic Headaches w/ nausea: Took 4 aleve daily.  --Will add Claritin and nose spray to help with congestion as could be exacerbating HA --Will add Topamax HS for prevention.      I have personally performed a face to face diagnostic evaluation of  this patient and formulated the key components of the plan.  Additionally, I have personally reviewed laboratory data, imaging studies, as well as relevant notes and concur with the physician assistant's documentation above.   The patient's status has not changed from the original H&P.  Any changes in documentation from the acute care chart have been noted above.       Bary Leriche, PA-C 09/20/2022

## 2022-09-20 NOTE — Progress Notes (Signed)
Inpatient Rehab Admissions Coordinator:   Case opened for CIR admit yesterday with Pt,.'s insurance. Await auth. If Josem Kaufmann is received today, pt. Could potentially admit. I will follow up later today if Josem Kaufmann is received.   Clemens Catholic, [place], [place] Admissions Coordinator  [phone] ([place]) [phone] (office)

## 2022-09-20 NOTE — H&P (Signed)
Physical Medicine and Rehabilitation Admission H&P    CC: L hemiparesis   HPI: Kelly Stephenson is a 52 year old female with history of HTN--no meds X "years", chronic right shoulder pain (took aleve 4 pills/day) who was admitted on 09/16/22 from work with sudden onset of left facial droop, LUE/LLE weakness with decreased sensation and problems speaking. BP 210/110 at admission and CT head done showing acute hemorrhage in right basal ganglia with extending and overlying corona radiata with extension into right lateral, 3rd and 4th ventricles without hydro. CTA negative for vascular malformation and showed patent vessels. She was started on Cleveprex with SBP goal 130-150 and transferred to [facility] for management. Follow up MRI brain showed no lesion or infarct underlying thalamic hematoma and areas of T2 hyperintensity with periventricular and pontine involvement--question symptoms of MS v/s small vessel disease.   2D echo showed EF 60-65% with no wall abnormality and mild right ventricular wall thickness.   Dr. Erlinda Hong felt that stroke was secondary to uncontrolled HTN and oral meds added for better control. Lethargy resolving. She needed supplemental oxygen due to issues with hypoxia and reports difficulty breathing felt to be due to allergies. She has had issues with N/V and developed severe headaches. Last CT head 01/08 showed stable 1.6 X 1.0 X 1.9 cm decrease in IVH and mild surrounding edema with partial effacement of right lateral ventricle without shift or developing hydrocephalus.     Per chart, had BP 180s/110s in 2022 and was prescribed Metoprolol- per her husband, "she wasn't a medicine person" and so appeared she didn't fill the medicine.  Fioricet nor Oxycodone have been real helpful for headaches- using cold compresses and keeping eyes closed.  Using [facility] for voiding, but pt prefers the bedpan- nursing trying to make her use BSC.  Per husband/mother, hasn't eaten much since admission- no  BM since CVA 1/5- pt denies constipation.     Review of Systems  Constitutional:  Negative for fever.  HENT:  Positive for sinus pain. Negative for hearing loss.   Eyes:  Positive for photophobia.  Respiratory:  Negative for shortness of breath.   Cardiovascular:  Negative for chest pain and palpitations.  Gastrointestinal:  Positive for constipation (has not had a BM since admission--usually has one daily) and nausea.  Musculoskeletal:  Positive for joint pain (right shoulder pain/due to tennis).  Skin:  Positive for rash (right ankle, left great toe, left elbow).  Neurological:  Positive for dizziness (with therapy), sensory change, focal weakness and headaches.  All other systems reviewed and are negative.   Past Medical History:  Diagnosis Date   Hypertension     Past Surgical History:  Procedure Laterality Date   ABDOMINAL HYSTERECTOMY     CESAREAN SECTION      Family History  Problem Relation Age of Onset   Osteoporosis Mother    High blood pressure Father    Heart Problems Father    Diabetes Father    Hypertension Father    Stroke Father    Heart attack Father    Depression Sister    Diabetes Sister    Hypertension Sister    Hypertension Maternal Aunt    Obesity Maternal Aunt    Hypertension Maternal Uncle    Kidney disease Maternal Uncle    Glaucoma Maternal Uncle    Stroke Paternal Uncle    Stroke Maternal Grandfather    Stroke Other     Social History:  Married. Has 38 year old  son who is home schooled.  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 Plays tennis 4 times a week. She reports that she has never smoked. She has never used smokeless tobacco. She reports current alcohol use of about 1.0 standard drink of alcohol per week. She reports that she does not use drugs.   Allergies: No Known Allergies   Medications Prior to Admission  Medication Sig Dispense Refill   amLODipine (NORVASC) 5 MG tablet Take 1 tablet (5 mg total) by mouth daily. (Patient  not taking: Reported on 09/17/2022) 30 tablet 3   metoprolol succinate (TOPROL-XL) 25 MG 24 hr tablet Take 1 tablet (25 mg total) by mouth daily. (Patient not taking: Reported on 09/17/2022) 90 tablet 3      Home: [place] expects to be discharged to:: Private residence Living Arrangements: Spouse/significant other, Children (36yo son who is home schooled) Available Help at Discharge: Family, Available 24 hours/day Type of Home: House Home Access: Stairs to enter Technical brewer of Steps: 3 Entrance Stairs-Rails: None Home Layout: Able to live on main level with bedroom/bathroom Bathroom Shower/Tub: Tub/shower unit, Multimedia programmer: Programmer, systems: Yes Home Equipment: None  Lives With: Spouse   Functional History: Prior Function Prior Level of Function : Independent/Modified Independent, Driving, Working/employed Mobility Comments: indep ADLs Comments: indep  Functional Status:  Mobility: Bed Mobility Overal bed mobility: Needs Assistance Bed Mobility: Supine to Sit Supine to sit: Min assist, HOB elevated General bed mobility comments: pt with good effort to try and assist with R UE using bed rail, assist with bringing hips to EOB using bed pad, limited functional use from L UE (inattention and was kind of "left behind") Transfers Overall transfer level: Needs assistance Equipment used: Rolling walker (2 wheels), None (face to face transfer with use of gait belt) Transfers: Sit to/from Stand, Bed to chair/wheelchair/BSC Sit to Stand: Min assist Bed to/from chair/wheelchair/BSC transfer type:: Step pivot Step pivot transfers: Mod assist General transfer comment: good power up with min A, focused on multimodal cues for LLE activation for supporting RLE during steps, mod A to pivot to [facility] wht face to face trechniqure, mod a to step pivot to recliner with RW Ambulation/Gait General Gait Details: unable this date     ADL: ADL Overall ADL's : Needs assistance/impaired Eating/Feeding: Moderate assistance, Sitting Eating/Feeding Details (indicate cue type and reason): for Bil tasks like cutting, but able to self-feed afterwards Grooming: Wash/dry face, Wash/dry hands, Minimal assistance, Sitting Grooming Details (indicate cue type and reason): cues to engage LUE Upper Body Bathing: Minimal assistance, Sitting Lower Body Bathing: Moderate assistance, Sit to/from stand Upper Body Dressing : Moderate assistance, Sitting Upper Body Dressing Details (indicate cue type and reason): to don second gown Lower Body Dressing: Moderate assistance, Sitting/lateral leans Lower Body Dressing Details (indicate cue type and reason): able to perform figure 4 and use R hand to don sock on L foot - LUE attempting to assist , weak pincher grasp and so futile at [employer] stime Toilet Transfer: Moderate assistance, +2 for physical assistance, +2 for safety/equipment Toilet Transfer Details (indicate cue type and reason): to the R, min A for stand, mod for pivot with multimodal cues for LLE to straighten/activate Toileting- Clothing Manipulation and Hygiene: Moderate assistance, +2 for safety/equipment, Sit to/from stand Toileting - Clothing Manipulation Details (indicate cue type and reason): able to stand with min A +2 and with OT pulling aside gown, able to perform front peri care in standing Functional mobility during  ADLs: Moderate assistance, +2 for physical assistance, +2 for safety/equipment, Cueing for safety, Cueing for sequencing (SPT only) General ADL Comments: L inattention, deficits in balance, cognition, coordination, proprioception all impacting ADL and functional transfers  Cognition: Cognition Overall Cognitive Status: Impaired/Different from baseline Arousal/Alertness: Awake/alert Orientation Level: Oriented to person, Oriented to place, Disoriented to time, Oriented to situation Year: 2023 Month: January Day  of Week: Incorrect Attention: Focused, Sustained Focused Attention: Impaired Focused Attention Impairment: Verbal basic Sustained Attention: Impaired Sustained Attention Impairment: Verbal basic Memory: Impaired Memory Impairment: Retrieval deficit, Decreased recall of new information Awareness: Impaired Awareness Impairment: Emergent impairment Executive Function: Initiating Initiating: Impaired Initiating Impairment: Verbal basic, Functional basic Behaviors: Restless, Poor frustration tolerance Safety/Judgment: Impaired Cognition Arousal/Alertness: Awake/alert Behavior During Therapy: Flat affect (and impulsive) Overall Cognitive Status: Impaired/Different from baseline Area of Impairment: Memory, Safety/judgement, Problem solving Memory: Decreased short-term memory Safety/Judgement: Decreased awareness of safety, Decreased awareness of deficits Problem Solving: Difficulty sequencing, Requires verbal cues, Requires tactile cues General Comments: somewhat lethagic throughout, needing cues to keep eyes open   Blood pressure (!) 142/81, pulse 90, temperature 97.6 F (36.4 C), temperature source Oral, resp. rate 20, height 5\' 4"  (1.626 m), weight 92.4 kg, SpO2 96 %. Physical Exam Vitals and nursing note reviewed. Exam conducted with a chaperone present.  Constitutional:      General: She is not in acute distress.    Appearance: She is obese.     Comments: Lying in darkened room. Restless and clutching her head at times. Kept eyes closed for entire exam except (EOM)   Pt sleeping initially, but husband and mother at bedside; on side but finally woke to participate in exam- didn't open eyes except for EOM exam, appears to be in pain, but no acute distress  HENT:     Head: Normocephalic and atraumatic.     Comments: No facial droop seen at rest No tongue deviation    Right Ear: External ear normal.     Left Ear: External ear normal.     Nose: Nose normal. No congestion.      Mouth/Throat:     Mouth: Mucous membranes are dry.     Pharynx: Oropharynx is clear. No oropharyngeal exudate.  Eyes:     General:        Right eye: No discharge.        Left eye: No discharge.     Extraocular Movements: Extraocular movements intact.  Cardiovascular:     Rate and Rhythm: Normal rate and regular rhythm.     Heart sounds: Normal heart sounds. No murmur heard.    No gallop.  Pulmonary:     Effort: Pulmonary effort is normal. No respiratory distress.     Breath sounds: Normal breath sounds. No wheezing, rhonchi or rales.  Abdominal:     General: There is no distension.     Palpations: Abdomen is soft.     Comments: Pt abd is soft, NT, protuberant but not distended per pt- hypoactive BS  Musculoskeletal:     Cervical back: Neck supple. No tenderness.     Comments: RUE 5/5 LUE- biceps 4-/5; triceps 3+/5; Grip 3+/5; FA 2/5 RLE- 5/5 LLE- HF 2/5; KE/KF 2/5; DF/PF 1/5   Skin:    General: Skin is warm and dry.     Comments: Psoriatic patches left great toe, right lateral ankle and left elbow.   Neurological:     Mental Status: She is oriented to person, place, and time and easily aroused. She  is lethargic.     Comments: Speech clear. Was able to state month, year, DOB but not day. Restless and internally distracted by pain and fatigue (lack of sleep but chronic). Left sided weakness with sensory deficits and intention. Left foot drop noted.  Sensation intact to light touch in all 4 extremities and face Hard to assess inattention, etc since so sleepy/tired  Psychiatric:     Comments: Flat, sleepy due to pain meds per family     Results for orders placed or performed during the hospital encounter of 09/16/22 (from the past 48 hour(s))  Basic metabolic panel     Status: Abnormal   Collection Time: 09/19/22  4:44 AM  Result Value Ref Range   Sodium 133 (L) 135 - 145 mmol/L   Potassium 4.0 3.5 - 5.1 mmol/L   Chloride 101 98 - 111 mmol/L   CO2 23 22 - 32 mmol/L    Glucose, Bld 130 (H) 70 - 99 mg/dL    Comment: Glucose reference range applies only to samples taken after fasting for at least 8 hours.   BUN 17 6 - 20 mg/dL   Creatinine, Ser 0.85 0.44 - 1.00 mg/dL   Calcium 9.1 8.9 - 10.3 mg/dL   GFR, Estimated >60 >60 mL/min    Comment: (NOTE) Calculated using the CKD-EPI Creatinine Equation (2021)    Anion gap 9 5 - 15    Comment: Performed at [facility] [address] [place], [place] [address]  CBC     Status: Abnormal   Collection Time: 09/19/22  4:44 AM  Result Value Ref Range   WBC 11.1 (H) 4.0 - 10.5 K/uL   RBC 5.42 (H) 3.87 - 5.11 MIL/uL   Hemoglobin 15.5 (H) 12.0 - 15.0 g/dL   HCT 44.9 36.0 - 46.0 %   MCV 82.8 80.0 - 100.0 fL   MCH 28.6 26.0 - 34.0 pg   MCHC 34.5 30.0 - 36.0 g/dL   RDW 13.2 11.5 - 15.5 %   Platelets 278 150 - 400 K/uL   nRBC 0.0 0.0 - 0.2 %    Comment: Performed at [facility], [place] [address], [place], [place] [id number]  Basic metabolic panel     Status: Abnormal   Collection Time: 09/20/22  4:23 AM  Result Value Ref Range   Sodium 134 (L) 135 - 145 mmol/L   Potassium 3.9 3.5 - 5.1 mmol/L   Chloride 101 98 - 111 mmol/L   CO2 23 22 - 32 mmol/L   Glucose, Bld 117 (H) 70 - 99 mg/dL    Comment: Glucose reference range applies only to samples taken after fasting for at least 8 hours.   BUN 18 6 - 20 mg/dL   Creatinine, Ser 0.86 0.44 - 1.00 mg/dL   Calcium 9.4 8.9 - 10.3 mg/dL   GFR, Estimated >60 >60 mL/min    Comment: (NOTE) Calculated using the CKD-EPI Creatinine Equation (2021)    Anion gap 10 5 - 15    Comment: Performed at [facility] [address], [place] [address]  CBC     Status: Abnormal   Collection Time: 09/20/22  4:23 AM  Result Value Ref Range   WBC 10.9 (H) 4.0 - 10.5 K/uL   RBC 5.51 (H) 3.87 - 5.11 MIL/uL   Hemoglobin 15.4 (H) 12.0 - 15.0 g/dL   HCT 46.1 (H) 36.0 - 46.0 %   MCV 83.7 80.0 - 100.0 fL   MCH  27.9 26.0 - 34.0 pg   MCHC 33.4 30.0 - 36.0 g/dL    RDW 63.8 75.6 - 43.3 %   Platelets 331 150 - 400 K/uL   nRBC 0.0 0.0 - 0.2 %    Comment: Performed at [facility], [address], [place], [place] [address]   CT HEAD WO CONTRAST ( )  Result Date: 09/19/2022 CLINICAL DATA:  Intracranial hemorrhage EXAM: CT HEAD WITHOUT CONTRAST TECHNIQUE: Contiguous axial images were obtained from the base of the skull through the vertex without intravenous contrast. RADIATION DOSE REDUCTION: This exam was performed according to the departmental dose-optimization program which includes automated exposure control, adjustment of the mA and/or kV according to patient size and/or use of iterative reconstruction technique. COMPARISON:  CT head 09/16/2022 FINDINGS: Brain: The 1.6 cm AP x 1.0 cm TV x 1.9 cm cc right thalamic hematoma is unchanged in size. Intraventricular extension of the body of the right lateral ventricle is similar to the prior study; however, blood in the third and fourth ventricles is decreased. There is mild surrounding edema with partial effacement of the adjacent right lateral ventricle but no midline shift. The ventricular system is stable in size and configuration without evidence of developing hydrocephalus. There is no new acute intracranial hemorrhage or extra-axial fluid collection. There is no acute territorial infarct. There is no mass lesion. Vascular: No hyperdense vessel or unexpected calcification. Skull: Normal. Negative for fracture or focal lesion. Sinuses/Orbits: Stable. IMPRESSION: 1. Stable size of the right thalamic hematoma with mild surrounding edema but no midline shift, and decreased intraventricular blood 2. No new acute intracranial pathology. Electronically Signed   By: Lesia Hausen M.D.   On: 09/19/2022 20:34      Blood pressure (!) 142/81, pulse 90, temperature 97.6 F (36.4 C), temperature source Oral, resp. rate 20, height 5\' 4"  (1.626 m), weight 92.4 kg, SpO2 96 %.  Medical Problem List and Plan: 1. 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  Antithrombotics: -DVT/anticoagulation:  Pharmaceutical: Lovenox  -antiplatelet therapy: N/A 3. Pain Management:  On oxycodone and Fioricet prn.   --will start sleep chart. Add topamax  If topamax doesn't work, can try Elavil? 4. Mood/Behavior/Sleep: LCSW to follow for evaluation and support.  --Has a lot of stress, anxiety, insomnia-->hasn't wanted to get help per husband.  --chronic insomnia-->up/down multiple times during the night. --antipsychotic agents: N/A 5. Neuropsych/cognition: This patient is capable of making decisions on her own behalf. 6. Skin/Wound Care: Routine pressure relief measures.  7. Fluids/Electrolytes/Nutrition: Monitor I/O. Check CMET in am. 8. HTN: Monitor BP TID. Was not compliant with medications--does not want meds/doesn't like meds, so was noncompliant at home with meds prescribed.  --Multiple meds added and titrated. Orthostatic symptoms reported.  --On Norvasc, Cozaar, Hydralazine and Lopressor.  --SBP goal 130-150 range.  9. Hyperlipidemia: LDL 206- on Crestor. 10. Hyponatremia: Question SIADH. Monitor I/O. Check CMET in am.  11. Leucocytosis: Likely reactive--continue to trend. 10.3-->13.5-->10.9. Tmax-99.2  --Monitor for fevers and other signs of infection.  12. Acute on chronic Headaches w/ nausea: Took 4 aleve daily.  --Will add Claritin and nose spray to help with congestion as could be exacerbating HA --Will add Topamax HS for prevention.    I have personally performed a face to face diagnostic evaluation of this patient and formulated the key components of the plan.  Additionally, I have personally  reviewed laboratory data, imaging studies, as well as relevant notes and concur with the physician assistant's documentation above.   The patient's status has not changed from the original H&P.  Any changes in  documentation from the acute care chart have been noted above.     Bary Leriche, PA-C 09/20/2022

## 2022-09-20 NOTE — Evaluation (Signed)
Speech Language Pathology Evaluation Patient Details Name: Kelly Stephenson MRN: [id number] DOB: October 11, 1970 Today's Date: 09/20/2022 Time: 0950-1005 SLP Time Calculation (min) (ACUTE ONLY): 15 min  Problem List:  Patient Active Problem List   Diagnosis Date Noted   ICH (intracerebral hemorrhage) (HCC) 09/16/2022   Hepatic steatosis 12/28/2020   Primary hypertension 12/11/2020   Psoriasis 12/11/2020   COVID-19 10/08/2020   Past Medical History:  Past Medical History:  Diagnosis Date   Hypertension    Past Surgical History:  Past Surgical History:  Procedure Laterality Date   ABDOMINAL HYSTERECTOMY     CESAREAN SECTION     HPI:  Patient is a 52 y.o. female with PMH: HTN (not currently taking any medications), who presented to [facility] ED on 09/16/22 after suddenly struggling for words and developing acute onset left sided weakness while at work. Patient reported that she had recently been in a large crowd and felt like she contracted a viral illness. NIHSS: 5 on admission CT head on 1/5 showed acute hemorrhage in the right basal ganglia extend overlying corona radiata and repeat CT head with contrast showed Stable right caudate body/superior thalamus hematoma with extension of hemorrhage into the right lateral ventricle, third and fourth ventricle.   Assessment / Plan / Recommendation Clinical Impression  Patient presents with mild-moderate cognitive-linguistic impairments as per this evaluation. When SLP arrived into room, patient laying on side but awake and agreeable to participate. She required frequent cues to engage and participate and would return to lying on side with eyes closed. Speech was clear and 100% intelligible with no dysarthria observed. Oral motor exam did not reveal any significant weakness or decrease in ROM. Patient was oriented to place, basic situation ("I had a stroke at work") and oriented to month and year. When asked day of week and date, she promptly responded, "I  dont know" and "I have no idea".  She did recall that she had PT yesterday and said "they said it went well". When asked if she was able to walk she said, "I can't walk-walk". She was fixated on c/o being uncomfortable with sinus pressure "can't breathe", feeling hot and head hurting. Her responses were always very brief and no elaboration unless cued. SLP will continue to follow patient with plan for ongoing cognitive-linguistic assessment.    SLP Assessment  SLP Recommendation/Assessment: Patient needs continued Speech Lanaguage Pathology Services SLP Visit Diagnosis: Cognitive communication deficit (R41.841)    Recommendations for follow up therapy are one component of a multi-disciplinary discharge planning process, led by the attending physician.  Recommendations may be updated based on patient status, additional functional criteria and insurance authorization.    Follow Up Recommendations  Follow physician's recommendations for discharge plan and follow up therapies    Assistance Recommended at Discharge  PRN  Functional Status Assessment Patient has had a recent decline in their functional status and demonstrates the ability to make significant improvements in function in a reasonable and predictable amount of time.  Frequency and Duration min 1 x/week  2 weeks      SLP Evaluation Cognition  Overall Cognitive Status: Impaired/Different from baseline Arousal/Alertness: Awake/alert Orientation Level: Oriented to person;Oriented to place;Disoriented to time;Oriented to situation Year: 2023 Month: January Day of Week: Incorrect Attention: Focused;Sustained Focused Attention: Impaired Focused Attention Impairment: Verbal basic Sustained Attention: Impaired Sustained Attention Impairment: Verbal basic Memory: Impaired Memory Impairment: Retrieval deficit;Decreased recall of new information Awareness: Impaired Awareness Impairment: Emergent impairment Executive Function:  Initiating Initiating: Impaired Initiating Impairment: Verbal  basic;Functional basic Behaviors: Restless;Poor frustration tolerance Safety/Judgment: Impaired       Comprehension  Auditory Comprehension Overall Auditory Comprehension: Appears within functional limits for tasks assessed    Expression Expression Primary Mode of Expression: Verbal Verbal Expression Overall Verbal Expression: Impaired Pragmatics: Impairment Impairments: Eye contact;Monotone;Abnormal affect Interfering Components: Attention Effective Techniques: Open ended questions Non-Verbal Means of Communication: Not applicable   Oral / Motor  Oral Motor/Sensory Function Overall Oral Motor/Sensory Function: Within functional limits Motor Speech Overall Motor Speech: Appears within functional limits for tasks assessed Respiration: Within functional limits Resonance: Within functional limits Articulation: Within functional limitis Intelligibility: Intelligible Motor Planning: Witnin functional limits Motor Speech Errors: Not applicable           Sonia Baller, MA, CCC-SLP Speech Therapy

## 2022-09-20 NOTE — TOC Transition Note (Signed)
Transition of Care [facility]) - CM/SW Discharge Note   Patient Details  Name: MACKLYN GLANDON MRN: [id number] Date of Birth: 11/24/1970  Transition [facility]) CM/SW Contact:  Pollie Friar, RN Phone Number: 09/20/2022, 1:27 PM   Clinical Narrative:    Pt is discharging to CIR today. No further needs per TOC.   Final next level of care: IP Rehab Facility Barriers to Discharge: No Barriers Identified   Patient Goals and CMS Choice CMS Medicare.gov Compare Post Acute Care list provided to:: Patient Choice offered to / list presented to : Patient  Discharge Placement                         Discharge Plan and Services Additional resources added to the After Visit Summary for                                       Social Determinants of Health (SDOH) Interventions SDOH Screenings   Food Insecurity: No Food Insecurity (09/16/2022)  Housing: Low Risk  (09/16/2022)  Transportation Needs: No Transportation Needs (09/16/2022)  Utilities: Not At Risk (09/16/2022)  Alcohol Screen: Low Risk  (12/11/2020)  Depression (PHQ2-9): Medium Risk (12/11/2020)  Tobacco Use: Low Risk  (09/20/2022)     Readmission Risk Interventions     No data to display

## 2022-09-20 NOTE — Progress Notes (Signed)
Physical Therapy Treatment Patient Details Name: Kelly Stephenson MRN: [id number] DOB: 1971/03/20 Today's Date: 09/20/2022   History of Present Illness Kelly Stephenson is a 52 y.o. female brought to [facility] due to sudden onset of left-sided weakness and word finding difficulties. 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 PMH: HTN not on medication, anxiety    PT Comments    Pt greeted sidelying in bed, with spouse present at bedside, and agreeable to session with continued progress towards acute goals. Pt able to complete bed mobility with min assist with pt demonstrating good compensatory strategies, hooking RLE under LLE, however pt continues to need cues to attend to LUE as pt leaving it behind. Pt able to transfer EOB>BSC>recliner with mod assist with RW and face to face transfer and complete multiple STS transfers from recliner with min assist to power up. Pt needing max cues throughout session for safety and sequencing as pt impulsive with all mobility and endorsing high fear of falling. Pt able to shift weight onto R in standing and maintain single leg stance on R without LOB, however pt needing max cues to maintain R hand in contact with RW for safety. Pt continues to be limited by lethargy as pt closing eyes intermittently and sitting abruptly despite cues to remain standing. Plan to progress gait next next session within pt tolerance. Current plan remains appropriate to address deficits and maximize functional independence and decrease caregiver burden. Pt continues to benefit from skilled PT services to progress toward functional mobility goals.     Recommendations for follow up therapy are one component of a multi-disciplinary discharge planning process, led by the attending physician.  Recommendations may be updated based on patient status, additional functional criteria and insurance authorization.  Follow Up Recommendations  Acute inpatient rehab (3hours/day)      Assistance Recommended at Discharge Frequent or constant Supervision/Assistance  Patient can return home with the following A lot of help with walking and/or transfers;A lot of help with bathing/dressing/bathroom;Assist for transportation;Help with stairs or ramp for entrance;Assistance with cooking/housework   Equipment Recommendations  Other (comment) (TBD at next venue)    Recommendations for Other Services       Precautions / Restrictions Precautions Precautions: Fall Restrictions Weight Bearing Restrictions: No     Mobility  Bed Mobility Overal bed mobility: Needs Assistance Bed Mobility: Supine to Sit     Supine to sit: Min assist, HOB elevated     General bed mobility comments: pt with good effort to try and assist with R UE using bed rail, assist with bringing hips to EOB using bed pad, limited functional use from L UE (inattention and was kind of "left behind")    Transfers Overall transfer level: Needs assistance Equipment used: Rolling walker (2 wheels), None (face to face transfer with use of gait belt) Transfers: Sit to/from Stand, Bed to chair/wheelchair/BSC Sit to Stand: Min assist   Step pivot transfers: Mod assist       General transfer comment: good power up with min A, focused on multimodal cues for LLE activation for supporting RLE during steps, mod A to pivot to [facility] wht face to face trechniqure, mod a to step pivot to recliner with RW    Ambulation/Gait             Pre-gait activities: weight ashifting R/L picking up LLE when shifting R, SLS on R General Gait Details: unable this date   Stairs  Wheelchair Mobility    Modified Rankin (Stroke Patients Only) Modified Rankin (Stroke Patients Only) Pre-Morbid Rankin Score: No symptoms Modified Rankin: Severe disability     Balance Overall balance assessment: Needs assistance Sitting-balance support: Feet supported, Single extremity supported Sitting balance-Leahy  Scale: Fair Sitting balance - Comments: improved from previous PT session, able to sit min guard - borderline supervision Postural control: Left lateral lean (with fatigue, bordering on pushing) Standing balance support: During functional activity, Bilateral upper extremity supported Standing balance-Leahy Scale: Poor Standing balance comment: requires external assist for balance - left lateral lean bordering on pushing, can maintain balance in single leg stance on RLE                            Cognition Arousal/Alertness: Awake/alert Behavior During Therapy: Flat affect (and impulsive)   Area of Impairment: Memory, Safety/judgement, Problem solving                     Memory: Decreased short-term memory   Safety/Judgement: Decreased awareness of safety, Decreased awareness of deficits   Problem Solving: Difficulty sequencing, Requires verbal cues, Requires tactile cues General Comments: somewhat lethagic throughout, needing cues to keep eyes open        Exercises General Exercises - Lower Extremity Long Arc Quad: AROM, Left, Seated, 5 reps    General Comments General comments (skin integrity, edema, etc.): VSS on RA, pt anxious about falling throughout session, however impulsive with all mobility needing max cues for safety and to slow movements and with pt following commands with increased time      Pertinent Vitals/Pain Pain Assessment Pain Assessment: Faces Faces Pain Scale: Hurts a little bit Pain Location: headache Pain Descriptors / Indicators: Headache Pain Intervention(s): Monitored during session, Limited activity within patient's tolerance    Home Living     Available Help at Discharge: Family;Available 24 hours/day Type of Home: House                  Prior Function            PT Goals (current goals can now be found in the care plan section) Acute Rehab PT Goals PT Goal Formulation: With patient/family Time For Goal  Achievement: 10/01/22 Progress towards PT goals: Progressing toward goals    Frequency    Min 4X/week      PT Plan Current plan remains appropriate    Co-evaluation              AM-PAC PT "6 Clicks" Mobility   Outcome Measure  Help needed turning from your back to your side while in a flat bed without using bedrails?: A Lot Help needed moving from lying on your back to sitting on the side of a flat bed without using bedrails?: A Lot Help needed moving to and from a bed to a chair (including a wheelchair)?: A Lot Help needed standing up from a chair using your arms (e.g., wheelchair or bedside chair)?: A Lot Help needed to walk in hospital room?: A Lot Help needed climbing 3-5 steps with a railing? : Total 6 Click Score: 11    End of Session Equipment Utilized During Treatment: Gait belt Activity Tolerance: Patient tolerated treatment well Patient left: in chair;with call bell/phone within reach;with chair alarm set;with family/visitor present (spouse) Nurse Communication: Mobility status (turn chair around to complete std pvt to R side) PT Visit Diagnosis: Muscle weakness (generalized) (M62.81);Difficulty in walking, not  elsewhere classified (R26.2)     Time: 1004-1030 PT Time Calculation (min) (ACUTE ONLY): 26 min  Charges:  $Therapeutic Activity: 23-37 mins                     Seaborn Nakama R. PTA Acute Rehabilitation Services Office: [phone]    Catalina Antigua 09/20/2022, 11:00 AM

## 2022-09-20 NOTE — Progress Notes (Signed)
PMR Admission Coordinator Pre-Admission Assessment   Patient: Kelly Stephenson is an 52 y.o., female MRN: [id number] DOB: Nov 22, 1970 Height:   Weight:     Insurance Information HMO:     PPO: yes     PCP:      IPA:      80/20:      OTHER:  PRIMARY: BCBS Commercial      Policy#: [id number]      Subscriber: Pt CM Name: Health Management       Phone#: [phone]     Fax#: [phone] Pre-Cert#: [id number]      Employer:  Pt. Approved for admit via fax 09/20/22 for dates 1/9-1/15 Benefits:  Phone #: [phone]     Name: Jamesetta Orleans Date: 04/12/2014 - still active Deductible: $800 ($0 met) OOP Max: $6,000 ($0 met) CIR: 70% coverage, 30% co-insurance SNF: 70% coverage, 30% co-insurance, limited to 120 days/cal yr; 120 remaining Outpatient:  70% coverage, 30% co-insurance; limited to 25 visits combined/cal yr Home Health:  100% coverage; limited to 120 visits/cal yr DME: 70% coverage, 30% co-insurance Providers: in network SECONDARY:       Policy#:      Phone#:  Development worker, community:       Phone#:    The Engineer, petroleum" for patients in Inpatient Rehabilitation Facilities with attached "Privacy Act [place] Records" was provided and verbally reviewed with: Patient   Emergency Contact Information Contact Information       Name Relation Home Work Mobile    Medine,George Spouse [phone]   [phone]           Current Medical History  Patient Admitting Diagnosis: Kelly Stephenson History of Present Illness: Kelly Stephenson is a 52 y.o. female with history of hypertension not currently taking any medications who presented to [facility] ED 09/16/22  after she was at work and suddenly began struggling for her words and developed sudden onset left-sided weakness.  EMS was called, and patient was brought to the ED at [facility] and found to have a right basal ganglia ICH.  She was transferred to [facility] ED  for further  care in the ICU. 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 2D Echo with  EF 60-65%. UDS negative. ICH felt due to hypertensive emergency. Pt. With unstable BP, given cleviprex in the ICU. She was seen by PT/OT/SLP and they recommend CIR to assist return to PLOF.    Complete NIHSS TOTAL: 5   Patient's medical record from [facility]  has been reviewed by the rehabilitation admission coordinator and physician.   Past Medical History      Past Medical History:  Diagnosis Date   Hypertension        Has the patient had major surgery during 100 days prior to admission? No   Family History   family history includes Depression in her sister; Diabetes in her father and sister; Glaucoma in her maternal uncle; Heart Problems in her father; Heart attack in her father; High blood pressure in her father; Hypertension in her father, maternal aunt, maternal uncle, and sister; Kidney disease in her maternal uncle; Obesity in her maternal aunt; Osteoporosis in her mother; Stroke in her father, maternal grandfather, paternal uncle, and another family member.   Current Medications   Current Facility-Administered Medications:    acetaminophen (  TYLENOL) tablet 650 mg, 650 mg, Oral, Q4H PRN, 650 mg at 09/18/22 1122 **OR** acetaminophen (TYLENOL) 160 MG/5ML solution 650 mg, 650 mg, Per Tube, Q4H PRN **OR** acetaminophen (TYLENOL) suppository 650 mg, 650 mg, Rectal, Q4H PRN, de Saintclair Halsted, Cortney E, NP   amLODipine (NORVASC) tablet 10 mg, 10 mg, Oral, Daily, Marvel Plan, MD, 10 mg at 09/18/22 0856   Chlorhexidine Gluconate Cloth 2 % PADS 6 each, 6 each, Topical, Daily, Bhagat, Srishti L, MD, 6 each at 09/18/22 0855   clevidipine (CLEVIPREX) infusion 0.5 mg/mL, 0-32 mg/hr, Intravenous, Continuous, Marvel Plan, MD, Last Rate: 10 mL/hr at 09/18/22 1239, 5 mg/hr at 09/18/22 1239   heparin injection  5,000 Units, 5,000 Units, Subcutaneous, Q8H, Xu, Jindong, MD   labetalol (NORMODYNE) injection 10-20 mg, 10-20 mg, Intravenous, Q2H PRN, Leanord Hawking F, NP, 20 mg at 09/18/22 1144   losartan (COZAAR) tablet 100 mg, 100 mg, Oral, Daily, Marvel Plan, MD, 100 mg at 09/18/22 0856   metoprolol tartrate (LOPRESSOR) tablet 50 mg, 50 mg, Oral, BID, Leanord Hawking F, NP   ondansetron [facility]) injection 4 mg, 4 mg, Intravenous, Q6H PRN, de Saintclair Halsted, Cortney E, NP, 4 mg at 09/16/22 1636   pantoprazole (PROTONIX) EC tablet 40 mg, 40 mg, Oral, Daily, Marvel Plan, MD, 40 mg at 09/18/22 0856   rosuvastatin (CRESTOR) tablet 40 mg, 40 mg, Oral, Daily, Marvel Plan, MD, 40 mg at 09/18/22 0857   senna-docusate (Senokot-S) tablet 1 tablet, 1 tablet, Oral, BID, de Saintclair Halsted, Cortney E, NP   Patients Current Diet:  Diet Order                  Diet Heart Room service appropriate? Yes with Assist; Fluid consistency: Thin  Diet effective now                         Precautions / Restrictions Precautions Precautions: Fall Restrictions Weight Bearing Restrictions: No    Has the patient had 2 or more falls or a fall with injury in the past year? No   Prior Activity Level Community (5-7x/wk): Pt. was active in the community PTA   Prior Functional Level Self Care: Did the patient need help bathing, dressing, using the toilet or eating? Independent   Indoor Mobility: Did the patient need assistance with walking from room to room (with or without device)? Independent   Stairs: Did the patient need assistance with internal or external stairs (with or without device)? Independent   Functional Cognition: Did the patient need help planning regular tasks such as shopping or remembering to take medications? Independent   Patient Information Are you of Hispanic, Latino/a,or Spanish origin?: A. No, not of Hispanic, Latino/a, or Spanish origin What is your race?: A. White Do you need or want an interpreter  to communicate with a doctor or health care staff?: 0. No   Patient's Response To:  Health Literacy and Transportation Is the patient able to respond to health literacy and transportation needs?: Yes Health Literacy - How often do you need to have someone help you when you read instructions, pamphlets, or other written material from your doctor or pharmacy?: Never In the past 12 months, has lack of transportation kept you from medical appointments or from getting medications?: No In the past 12 months, has lack of transportation kept you from meetings, work, or from getting things needed for daily living?: No   Journalist, newspaper / Equipment Home Assistive Devices/Equipment: None  Home Equipment: None   Prior Device Use: Indicate devices/aids used by the patient prior to current illness, exacerbation or injury? None of the above   Current Functional Level Cognition   Overall Cognitive Status: Within Functional Limits for tasks assessed Orientation Level: Oriented X4 General Comments: pt able to follow all commands, pt with delayed response time but is sleepy, pt orientedx4    Extremity Assessment (includes Sensation/Coordination)   Upper Extremity Assessment: LUE deficits/detail LUE Deficits / Details: grossly 3-/5, impaired sensation LUE Sensation: decreased light touch, decreased proprioception  Lower Extremity Assessment: LLE deficits/detail LLE Deficits / Details: grossly 3/5, DF 1/5 LLE Sensation: decreased light touch     ADLs         Mobility   Overal bed mobility: Needs Assistance Bed Mobility: Supine to Sit Supine to sit: Mod assist, HOB elevated, +2 for physical assistance General bed mobility comments: pt with good effort to try and assist with R UE, pt pulled self up on PT while RN tech assist with bringing hips to EOB using bed pad, limited functional use from L UE and LE     Transfers   Overall transfer level: Needs assistance Equipment used: 2 person hand  held assist (face to face transfer with use of gait belt) Transfers: Sit to/from Stand, Bed to chair/wheelchair/BSC Sit to Stand: Mod assist, +2 physical assistance Bed to/from chair/wheelchair/BSC transfer type:: Step pivot Step pivot transfers: Max assist, +2 physical assistance General transfer comment: pt unable to use L UE to assist with transfer, L knee blocked due to buckling, maxA to advance L LE during transfer to chair     Ambulation / Gait / Stairs / Wheelchair Mobility   Ambulation/Gait General Gait Details: unable this date     Posture / Balance Dynamic Sitting Balance Sitting balance - Comments: when focused on it pt able to maintain balance with close min guard however due to lethargy pt with fall to the L requiring verbal cues to self correct Balance Overall balance assessment: Needs assistance Sitting-balance support: Feet supported, Single extremity supported Sitting balance-Leahy Scale: Poor Sitting balance - Comments: when focused on it pt able to maintain balance with close min guard however due to lethargy pt with fall to the L requiring verbal cues to self correct Postural control: Left lateral lean Standing balance support: During functional activity, Single extremity supported Standing balance-Leahy Scale: Zero Standing balance comment: dependent on PT and PT tech     Special needs/care consideration Special service needs none     Previous Home Environment (from acute therapy documentation) Living Arrangements: Spouse/significant other, Children (17yo son who is home schooled)  Lives With: Spouse Available Help at Discharge: Family, Available 24 hours/day (spouse works but son can be there) Type of Home: House Home Layout: Able to live on main level with bedroom/bathroom Home Access: Stairs to enter Entrance Stairs-Rails: None Entrance Stairs-Number of Steps: 3 Bathroom Shower/Tub: Hydrographic surveyor, Health visitor: Academic librarian: Yes How Accessible: Accessible via wheelchair, Accessible via walker Home Care Services: No   Discharge Living Setting Plans for Discharge Living Setting: Patient's home Type of Home at Discharge: House Discharge Home Layout: Able to live on main level with bedroom/bathroom Discharge Home Access: Stairs to enter Entrance Stairs-Rails: None Entrance Stairs-Number of Steps: 3 Discharge Bathroom Shower/Tub: Tub/shower unit Discharge Bathroom Toilet: Standard Discharge Bathroom Accessibility: No Does the patient have any problems obtaining your medications?: No   Social/Family/Support Systems Patient Roles: Spouse Contact Information: [phone] Anticipated Caregiver:  Holland Falling Ability/Limitations of Caregiver: Family to rotate to assit 24/7, husband may take FMLA Caregiver Availability: 24/7 Discharge Plan Discussed with Primary Caregiver: Yes Is Caregiver In Agreement with Plan?: Yes Does Caregiver/Family have Issues with Lodging/Transportation while Pt is in Rehab?: No   Goals Patient/Family Goal for Rehab: PT/OT/SLP MIn A Expected length of stay: 18-21 days Pt/Family Agrees to Admission and willing to participate: Yes Program Orientation Provided & Reviewed with Pt/Caregiver Including Roles  & Responsibilities: Yes   Decrease burden of Care through IP rehab admission: not anticipated   Possible need for SNF placement upon discharge: not anticipated    Patient Condition: I have reviewed medical records from [place], spoken with CM, and patient. I met with patient at the bedside for inpatient rehabilitation assessment.  Patient will benefit from ongoing PT, OT, and SLP, can actively participate in 3 hours of therapy a day 5 days of the week, and can make measurable gains during the admission.  Patient will also benefit from the coordinated team approach during an Inpatient Acute Rehabilitation admission.  The patient will receive intensive  therapy as well as Rehabilitation physician, nursing, social worker, and care management interventions.  Due to safety, skin/wound care, disease management, medication administration, pain management, and patient education the patient requires 24 hour a day rehabilitation nursing.  The patient is currently min A  with mobility and basic ADLs.  Discharge setting and therapy post discharge at home with home health is anticipated.  Patient has agreed to participate in the Acute Inpatient Rehabilitation Program and will admit today.   Preadmission Screen Completed By:  Jeronimo Greaves, 09/18/2022 1:19 PM ______________________________________________________________________   Discussed status with Dr. Berline Chough  on 09/20/22 at 930 and received approval for admission today.   Admission Coordinator:  Jeronimo Greaves, CCC-SLP, time 1230/Date 09/20/22    Assessment/Plan: Diagnosis: Does the need for close, 24 hr/day Medical supervision in concert with the patient's rehab needs make it unreasonable for this patient to be served in a less intensive setting? Yes Co-Morbidities requiring supervision/potential complications: uncontrolled HTN, R BG ICH; L hemiparesis Due to bladder management, bowel management, safety, skin/wound care, disease management, medication administration, pain management, and patient education, does the patient require 24 hr/day rehab nursing? Yes Does the patient require coordinated care of a physician, rehab nurse, PT, OT, and SLP to address physical and functional deficits in the context of the above medical diagnosis(es)? Yes Addressing deficits in the following areas: balance, endurance, locomotion, strength, transferring, bowel/bladder control, bathing, dressing, feeding, grooming, toileting, cognition, speech, language, and swallowing Can the patient actively participate in an intensive therapy program of at least 3 hrs of therapy 5 days a week? Yes The potential for patient to make  measurable gains while on inpatient rehab is good Anticipated functional outcomes upon discharge from inpatient rehab: min assist PT, min assist OT, min assist SLP Estimated rehab length of stay to reach the above functional goals is: 18-21 days Anticipated discharge destination: Home 10. Overall Rehab/Functional Prognosis: good     MD Signature:

## 2022-09-20 NOTE — Progress Notes (Signed)
Inpatient Rehabilitation Admission Medication Review by a Pharmacist  A complete drug regimen review was completed for this patient to identify any potential clinically significant medication issues.  High Risk Drug Classes Is patient taking? Indication by Medication  Antipsychotic Yes Compazine (PO/IM/PR) - nausea  Anticoagulant Yes Enoxaparin -VTE ppx  Antibiotic No   Opioid Yes Oxycodone - pain  Antiplatelet No   Hypoglycemics/insulin No   Vasoactive Medication Yes Metoprolol, hydralazine, losartan, amlodipine - HTN  Chemotherapy No   Other Yes Crestor - HLD Topamax, fioricet - migraine ppx Robitussin DM - cough Trazodone - sleep Benadryl - itching     Type of Medication Issue Identified Description of Issue Recommendation(s)  Drug Interaction(s) (clinically significant)     Duplicate Therapy     Allergy     No Medication Administration End Date     Incorrect Dose     Additional Drug Therapy Needed     Significant med changes from prior encounter (inform family/care partners about these prior to discharge).    Other       Clinically significant medication issues were identified that warrant physician communication and completion of prescribed/recommended actions by midnight of the next day:  No  Name of provider notified for urgent issues identified:   Provider Method of Notification:     Pharmacist comments:   Time spent performing this drug regimen review (minutes):  [place], PharmD, BCPS 09/20/2022 2:30 PM

## 2022-09-21 DIAGNOSIS — I69254 Hemiplegia and hemiparesis following other nontraumatic intracranial hemorrhage affecting left non-dominant side: Secondary | ICD-10-CM | POA: Diagnosis not present

## 2022-09-21 LAB — IRON AND TIBC
Iron: 79 ug/dL (ref 28–170)
Saturation Ratios: 22 % (ref 10.4–31.8)
TIBC: 363 ug/dL (ref 250–450)
UIBC: 284 ug/dL

## 2022-09-21 LAB — COMPREHENSIVE METABOLIC PANEL
ALT: 17 U/L (ref 0–44)
AST: 15 U/L (ref 15–41)
Albumin: 3.8 g/dL (ref 3.5–5.0)
Alkaline Phosphatase: 112 U/L (ref 38–126)
Anion gap: 12 (ref 5–15)
BUN: 23 mg/dL — ABNORMAL HIGH (ref 6–20)
CO2: 24 mmol/L (ref 22–32)
Calcium: 9.6 mg/dL (ref 8.9–10.3)
Chloride: 98 mmol/L (ref 98–111)
Creatinine, Ser: 1.04 mg/dL — ABNORMAL HIGH (ref 0.44–1.00)
GFR, Estimated: >60 mL/min (ref >60)
Glucose, Bld: 96 mg/dL (ref 70–99)
Potassium: 3.4 mmol/L — ABNORMAL LOW (ref 3.5–5.1)
Sodium: 134 mmol/L — ABNORMAL LOW (ref 135–145)
Total Bilirubin: 0.7 mg/dL (ref 0.3–1.2)
Total Protein: 7.6 g/dL (ref 6.5–8.1)

## 2022-09-21 LAB — CBC WITH DIFFERENTIAL/PLATELET
Abs Immature Granulocytes: 0.05 10*3/uL (ref 0.00–0.07)
Basophils Absolute: 0.1 10*3/uL (ref 0.0–0.1)
Basophils Relative: 1 %
Eosinophils Absolute: 0 10*3/uL (ref 0.0–0.5)
Eosinophils Relative: 0 %
HCT: 47.3 % — ABNORMAL HIGH (ref 36.0–46.0)
Hemoglobin: 15.9 g/dL — ABNORMAL HIGH (ref 12.0–15.0)
Immature Granulocytes: 0 %
Lymphocytes Relative: 15 %
Lymphs Abs: 1.8 10*3/uL (ref 0.7–4.0)
MCH: 28.2 pg (ref 26.0–34.0)
MCHC: 33.6 g/dL (ref 30.0–36.0)
MCV: 83.9 fL (ref 80.0–100.0)
Monocytes Absolute: 1.1 10*3/uL — ABNORMAL HIGH (ref 0.1–1.0)
Monocytes Relative: 9 %
Neutro Abs: 8.6 10*3/uL — ABNORMAL HIGH (ref 1.7–7.7)
Neutrophils Relative %: 75 %
Platelets: 354 10*3/uL (ref 150–400)
RBC: 5.64 MIL/uL — ABNORMAL HIGH (ref 3.87–5.11)
RDW: 12.9 % (ref 11.5–15.5)
WBC: 11.6 10*3/uL — ABNORMAL HIGH (ref 4.0–10.5)
nRBC: 0 % (ref 0.0–0.2)

## 2022-09-21 LAB — VITAMIN B12: Vitamin B-12: 179 pg/mL — ABNORMAL LOW (ref 180–914)

## 2022-09-21 LAB — MAGNESIUM: Magnesium: 2.3 mg/dL (ref 1.7–2.4)

## 2022-09-21 MED ORDER — TOPIRAMATE 25 MG PO TABS
75.0000 mg | ORAL_TABLET | Freq: Every day | ORAL | Status: DC
  Administered 2022-09-21: 75 mg via ORAL
  Filled 2022-09-21: qty 3

## 2022-09-21 MED ORDER — MAGNESIUM CITRATE PO SOLN
1.0000 | Freq: Once | ORAL | Status: DC
  Filled 2022-09-21: qty 296

## 2022-09-21 MED ORDER — POTASSIUM CHLORIDE CRYS ER 20 MEQ PO TBCR: 20.0000 meq | EXTENDED_RELEASE_TABLET | Freq: Two times a day (BID) | ORAL | Status: DC

## 2022-09-21 MED ORDER — POTASSIUM CHLORIDE 20 MEQ PO PACK
40.0000 meq | PACK | Freq: Once | ORAL | Status: AC
  Administered 2022-09-21: 40 meq via ORAL
  Filled 2022-09-21: qty 2

## 2022-09-21 MED ORDER — CYANOCOBALAMIN 1000 MCG/ML IJ SOLN
1000.0000 ug | Freq: Once | INTRAMUSCULAR | Status: AC
  Administered 2022-09-21: 1000 ug via INTRAMUSCULAR
  Filled 2022-09-21: qty 1

## 2022-09-21 NOTE — Plan of Care (Signed)
  Problem: RH Balance Goal: LTG Patient will maintain dynamic standing balance (PT) Description: LTG:  Patient will maintain dynamic standing balance with assistance during mobility activities (PT) Flowsheets (Taken 09/21/2022 1119) LTG: Pt will maintain dynamic standing balance during mobility activities with:: Contact Guard/Touching assist   Problem: Sit to Stand Goal: LTG:  Patient will perform sit to stand with assistance level (PT) Description: LTG:  Patient will perform sit to stand with assistance level (PT) Flowsheets (Taken 09/21/2022 1119) LTG: PT will perform sit to stand in preparation for functional mobility with assistance level: Contact Guard/Touching assist   Problem: RH Bed Mobility Goal: LTG Patient will perform bed mobility with assist (PT) Description: LTG: Patient will perform bed mobility with assistance, with/without cues (PT). Flowsheets (Taken 09/21/2022 1119) LTG: Pt will perform bed mobility with assistance level of: Contact Guard/Touching assist   Problem: RH Bed to Chair Transfers Goal: LTG Patient will perform bed/chair transfers w/assist (PT) Description: LTG: Patient will perform bed to chair transfers with assistance (PT). Flowsheets (Taken 09/21/2022 1119) LTG: Pt will perform Bed to Chair Transfers with assistance level: Contact Guard/Touching assist   Problem: RH Car Transfers Goal: LTG Patient will perform car transfers with assist (PT) Description: LTG: Patient will perform car transfers with assistance (PT). Flowsheets (Taken 09/21/2022 1119) LTG: Pt will perform car transfers with assist:: Minimal Assistance - Patient > 75%   Problem: RH Ambulation Goal: LTG Patient will ambulate in controlled environment (PT) Description: LTG: Patient will ambulate in a controlled environment, # of feet with assistance (PT). Flowsheets (Taken 09/21/2022 1119) LTG: Pt will ambulate in controlled environ  assist needed:: Contact Guard/Touching assist LTG: Ambulation  distance in controlled environment: 139ft Goal: LTG Patient will ambulate in home environment (PT) Description: LTG: Patient will ambulate in home environment, # of feet with assistance (PT). Flowsheets (Taken 09/21/2022 1119) LTG: Pt will ambulate in home environ  assist needed:: Contact Guard/Touching assist LTG: Ambulation distance in home environment: 90ft   Problem: RH Stairs Goal: LTG Patient will ambulate up and down stairs w/assist (PT) Description: LTG: Patient will ambulate up and down # of stairs with assistance (PT) Flowsheets (Taken 09/21/2022 1119) LTG: Pt will ambulate up/down stairs assist needed:: Minimal Assistance - Patient > 75% LTG: Pt will  ambulate up and down number of stairs: 3 without rails or per home setup

## 2022-09-21 NOTE — Progress Notes (Signed)
Orthopedic Tech Progress Note Patient Details:  Kelly Stephenson 15-Dec-1970 [id number]  Called in order to [employer] for a [employer]   Patient ID: LEYNA VANDERKOLK, female   DOB: 04-Jan-1971, 52 y.o.   MRN: [id number]  [name] 09/21/2022, 8:18 AM

## 2022-09-21 NOTE — Plan of Care (Signed)
Problem: RH Balance Goal: LTG: Patient will maintain dynamic sitting balance (OT) Description: LTG:  Patient will maintain dynamic sitting balance with assistance during activities of daily living (OT) Flowsheets (Taken 09/21/2022 1253) LTG: Pt will maintain dynamic sitting balance during ADLs with: Independent Goal: LTG Patient will maintain dynamic standing with ADLs (OT) Description: LTG:  Patient will maintain dynamic standing balance with assist during activities of daily living (OT)  Flowsheets (Taken 09/21/2022 1253) LTG: Pt will maintain dynamic standing balance during ADLs with: Supervision/Verbal cueing   Problem: Sit to Stand Goal: LTG:  Patient will perform sit to stand in prep for activites of daily living with assistance level (OT) Description: LTG:  Patient will perform sit to stand in prep for activites of daily living with assistance level (OT) Flowsheets (Taken 09/21/2022 1253) LTG: PT will perform sit to stand in prep for activites of daily living with assistance level: Independent with assistive device   Problem: RH Eating Goal: LTG Patient will perform eating w/assist, cues/equip (OT) Description: LTG: Patient will perform eating with assist, with/without cues using equipment (OT) Flowsheets (Taken 09/21/2022 1253) LTG: Pt will perform eating with assistance level of: Independent with assistive device    Problem: RH Grooming Goal: LTG Patient will perform grooming w/assist,cues/equip (OT) Description: LTG: Patient will perform grooming with assist, with/without cues using equipment (OT) Flowsheets (Taken 09/21/2022 1253) LTG: Pt will perform grooming with assistance level of: Independent with assistive device    Problem: RH Bathing Goal: LTG Patient will bathe all body parts with assist levels (OT) Description: LTG: Patient will bathe all body parts with assist levels (OT) Flowsheets (Taken 09/21/2022 1253) LTG: Pt will perform bathing with assistance level/cueing:  Minimal Assistance - Patient > 75% LTG: Position pt will perform bathing: Shower   Problem: RH Dressing Goal: LTG Patient will perform upper body dressing (OT) Description: LTG Patient will perform upper body dressing with assist, with/without cues (OT). Flowsheets (Taken 09/21/2022 1253) LTG: Pt will perform upper body dressing with assistance level of: Independent with assistive device Goal: LTG Patient will perform lower body dressing w/assist (OT) Description: LTG: Patient will perform lower body dressing with assist, with/without cues in positioning using equipment (OT) Flowsheets (Taken 09/21/2022 1253) LTG: Pt will perform lower body dressing with assistance level of: Minimal Assistance - Patient > 75%   Problem: RH Toileting Goal: LTG Patient will perform toileting task (3/3 steps) with assistance level (OT) Description: LTG: Patient will perform toileting task (3/3 steps) with assistance level (OT)  Flowsheets (Taken 09/21/2022 1253) LTG: Pt will perform toileting task (3/3 steps) with assistance level: Supervision/Verbal cueing   Problem: RH Functional Use of Upper Extremity Goal: LTG Patient will use RT/LT upper extremity as a (OT) Description: LTG: Patient will use right/left upper extremity as a stabilizer/gross assist/diminished/nondominant/dominant level with assist, with/without cues during functional activity (OT) Flowsheets (Taken 09/21/2022 1253) LTG: Use of upper extremity in functional activities: LUE as gross assist level LTG: Pt will use upper extremity in functional activity with assistance level of: Minimal Assistance - Patient > 75%   Problem: RH Toilet Transfers Goal: LTG Patient will perform toilet transfers w/assist (OT) Description: LTG: Patient will perform toilet transfers with assist, with/without cues using equipment (OT) Flowsheets (Taken 09/21/2022 1253) LTG: Pt will perform toilet transfers with assistance level of: Supervision/Verbal cueing   Problem:  RH Tub/Shower Transfers Goal: LTG Patient will perform tub/shower transfers w/assist (OT) Description: LTG: Patient will perform tub/shower transfers with assist, with/without cues using equipment (OT) Flowsheets (Taken  09/21/2022 1253) LTG: Pt will perform tub/shower stall transfers with assistance level of: Minimal Assistance - Patient > 75% LTG: Pt will perform tub/shower transfers from:  Tub/shower combination  Walk in shower   Problem: RH Memory Goal: LTG Patient will demonstrate ability for day to day recall/carry over during activities of daily living with assistance level (OT) Description: LTG:  Patient will demonstrate ability for day to day recall/carry over during activities of daily living with assistance level (OT). Flowsheets (Taken 09/21/2022 1253) LTG:  Patient will demonstrate ability for day to day recall/carry over during activities of daily living with assistance level (OT): Minimal Assistance - Patient > 75%   Problem: RH Attention Goal: LTG Patient will demonstrate this level of attention during functional activites (OT) Description: LTG:  Patient will demonstrate this level of attention during functional activites  (OT) Flowsheets (Taken 09/21/2022 1253) Patient will demonstrate this level of attention during functional activites: Selective Patient will demonstrate above attention level in the following environment: Home LTG: Patient will demonstrate this level of attention during functional activites (OT): Minimal Assistance - Patient > 75%   Problem: RH Awareness Goal: LTG: Patient will demonstrate awareness during functional activites type of (OT) Description: LTG: Patient will demonstrate awareness during functional activites type of (OT) Flowsheets (Taken 09/21/2022 1253) Patient will demonstrate awareness during functional activites type of: Anticipatory LTG: Patient will demonstrate awareness during functional activites type of (OT): Minimal Assistance - Patient  > 75%

## 2022-09-21 NOTE — Patient Care Conference (Signed)
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 Leeroy Cha Social Worker Present: Ovidio Kin, LCSW Nurse Present: Dorien Chihuahua, RN PT Present: Ginnie Smart, PT OT Present: Other (comment) Antony Salmon, OT) SLP Present: Helaine Chess, SLP PPS Coordinator present : Gunnar Fusi, SLP     Current Status/Progress Goal Weekly Team Focus  Bowel/Bladder   Pt is continent of b/b. LBM: 1/5   Remain continent to b/b.   Assist w/ toileting needs q shift & PRN.    Swallow/Nutrition/ Hydration               ADL's   max assist BADL   Supervision/min assist BADL   NMR LUE, Balance, activity tolerance, attention/ left attention    Mobility   modA bed mobility, modA squat pivot transfers, modA gait in // bars, maxA stairs   CGA overall  LLE NMR, gait training, dynamic standing balance, increasing alertness/awakeness    Communication                Safety/Cognition/ Behavioral Observations  eval pending            Pain   Pt c/o migraine pain rating as a 10/10. Fioricet & Oxy given for pain management.   Keep pain < 4/10. Limit noises & keep lights off if possible to help  with migraine management.   Assess pain q shift & PRN.    Skin   Skin is intact   Maintain skin integrity.  Assess skin q shift & PRN      Discharge Planning:  New evaluation-home with husband and family, still figuring out care for home.   Team Discussion: Patient with Headache, left side weakness, left inattention with sensory deficits and balance issues post ICH. Insomnia issues; drowsy as she has not been sleeping well at HS. Medications adjusted for constipation.  Patient on target to meet rehab goals: yes, currently needs mod assist for squat pivot transfers and able to ambulate 8' in the parallel bars and max assist to manage steps.  Needs max assist for ADLs.  SLP eval pending. Goals for discharge set for supervision - min assist overall.  *See Care Plan and progress notes for long and short-term goals.   Revisions to Treatment Plan:  D/C sleep chart   Teaching Needs: Safety, medications, transfers, toileting, etc.   Current Barriers to Discharge: Decreased caregiver support and Home enviroment access/layout  Possible Resolutions to Barriers: Family education     Medical Summary Current Status: constipation, headache, vitamin B12 deficiency, insomnia, obesity, uncontrolled hypertension, AKI  Barriers to Discharge: Medical stability;Morbid Obesity;Uncontrolled Pain  Barriers to Discharge Comments: constipation, headache, vitamin B12 deficiency, insomnia, obesity, hypertension, AKI Possible Resolutions to [employer] Focus: add magnesium citrate today after  therapy, Vitamin B12 injections ordered weekly, topamax increased to 75mg  HS, encouraged 6-8 glasses of water per day   Continued Need for Acute Rehabilitation Level of Care: The patient requires daily medical management by a physician with specialized training in physical medicine and rehabilitation for the following reasons: Direction of a multidisciplinary physical rehabilitation program to maximize  functional independence : Yes Medical management of patient stability for increased activity during participation in an intensive rehabilitation regime.: Yes Analysis of laboratory values and/or radiology reports with any subsequent need for medication adjustment and/or medical intervention. : Yes   I attest that I was present, lead the team conference, and concur with the assessment and plan of the team.   Dorien Chihuahua B 09/21/2022, 2:31 PM

## 2022-09-21 NOTE — Progress Notes (Signed)
Inpatient Rehabilitation  Patient information reviewed and entered into eRehab system by Nathen Balaban Shenia Alan, OTR/L, Rehab Quality Coordinator.   Information including medical coding, functional ability and quality indicators will be reviewed and updated through discharge.   

## 2022-09-21 NOTE — Progress Notes (Signed)
[facility] Individual Statement of Services  Patient Name:  Kelly Stephenson  Date:  09/21/2022  Welcome to the [facility].  Our goal is to provide you with an individualized program based on your diagnosis and situation, designed to meet your specific needs.  With this comprehensive rehabilitation program, you will be expected to participate in at least 3 hours of rehabilitation therapies Monday-Friday, with modified therapy programming on the weekends.  Your rehabilitation program will include the following services:  Physical Therapy (PT), Occupational Therapy (OT), Speech Therapy (ST), 24 hour per day rehabilitation nursing, Therapeutic Recreaction (TR), Neuropsychology, Care Coordinator, Rehabilitation Medicine, Nutrition Services, and Pharmacy Services  Weekly team conferences will be held on Wednesday to discuss your progress.  Your Inpatient Rehabilitation Care Coordinator will talk with you frequently to get your input and to update you on team discussions.  Team conferences with you and your family in attendance may also be held.  Expected length of stay: 14-16 days  Overall anticipated outcome: CGA-some min assist level  Depending on your progress and recovery, your program may change. Your Inpatient Rehabilitation Care Coordinator will coordinate services and will keep you informed of any changes. Your Inpatient Rehabilitation Care Coordinator's name and contact numbers are listed  below.  The following services may also be recommended but are not provided by the [facility] will be made to provide these services after discharge if needed.  Arrangements include referral to agencies that provide these services.  Your insurance has been verified to be:  [place] Your primary doctor is:  Lelon Huh  Pertinent information will be shared with your doctor and your insurance company.  Inpatient Rehabilitation Care Coordinator:  Ovidio Kin, [place] or Emilia Beck  Information discussed with and copy given to patient by: Elease Hashimoto, 09/21/2022, 9:57 AM

## 2022-09-21 NOTE — Plan of Care (Signed)
  Problem: RH Problem Solving Goal: LTG Patient will demonstrate problem solving for (SLP) Description: LTG:  Patient will demonstrate problem solving for basic/complex daily situations with cues  (SLP) Flowsheets (Taken 09/21/2022 1657) LTG: Patient will demonstrate problem solving for (SLP): Complex daily situations LTG Patient will demonstrate problem solving for: Supervision   Problem: RH Memory Goal: LTG Patient will demonstrate ability for day to day (SLP) Description: LTG:   Patient will demonstrate ability for day to day recall/carryover during cognitive/linguistic activities with assist  (SLP) Flowsheets (Taken 09/21/2022 1657) LTG: Patient will demonstrate ability for day to day recall:  Biographical information  New information LTG: Patient will demonstrate ability for day to day recall/carryover during cognitive/linguistic activities with assist (SLP): Supervision Goal: LTG Patient will use memory compensatory aids to (SLP) Description: LTG:  Patient will use memory compensatory aids to recall biographical/new, daily complex information with cues (SLP) Flowsheets (Taken 09/21/2022 1657) LTG: Patient will use memory compensatory aids to (SLP): Supervision   Problem: RH Attention Goal: LTG Patient will demonstrate this level of attention during functional activites (SLP) Description: LTG:  Patient will will demonstrate this level of attention during functional activites (SLP) Flowsheets (Taken 09/21/2022 1657) Patient will demonstrate during cognitive/linguistic activities the attention type of: Sustained Patient will demonstrate this level of attention during cognitive/linguistic activities in: Controlled LTG: Patient will demonstrate this level of attention during cognitive/linguistic activities with assistance of (SLP): Supervision   Problem: RH Awareness Goal: LTG: Patient will demonstrate awareness during functional activites type of (SLP) Description: LTG: Patient will  demonstrate awareness during functional activites type of (SLP) Flowsheets (Taken 09/21/2022 1657) Patient will demonstrate during cognitive/linguistic activities awareness type of: Emergent LTG: Patient will demonstrate awareness during cognitive/linguistic activities with assistance of (SLP): Supervision

## 2022-09-21 NOTE — Progress Notes (Signed)
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 She has never used smokeless tobacco. She reports current alcohol use of about 1.0 standard drink of alcohol per week. She reports that she does not use drugs.  Family / Support Systems Marital Status: Married Patient Roles: Spouse, Parent, Other (Comment) (employee) Spouse/Significant Other: [name] [phone] Children: 80 children-oldest daughter at [employer]  second daughter junior at [employer] and son is home schooled at home-HS age Other Supports: Extended family, friends and co-workers Anticipated Caregiver: Husband and family members Ability/Limitations of Caregiver: Still coming up with a plan for home-husband may take FMLA Caregiver Availability: Other (Comment) (Aware will need 24/7 care at first discussing plan) Family Dynamics: Close knit with children and extended family. They have good neighbors and friends would are willing to check on. Will work on a plan  Social History Preferred language: English Religion: Methodist Cultural Background: No issues Education: Librarian, academic - How often do you need to have someone help you when you read instructions, pamphlets, or other written  material from your doctor or pharmacy?: Never Writes: Yes Employment Status: Employed Name of Employer: Development worker, international aid at [employer] of Employment: 11 Return to Work Plans: Unsure depends upon progress and Geneticist, molecular Issues: No issues Guardian/Conservator: None-according to MD pt is capable of making her own decisions while here. Husband is present today to observe in evaluations   Abuse/Neglect Abuse/Neglect Assessment Can Be Completed: Yes Physical Abuse: Denies Verbal Abuse: Denies Sexual Abuse: Denies Exploitation of patient/patient's resources: Denies Self-Neglect: Denies  Patient response to: Social Isolation - How often do you feel lonely or isolated from those around you?: Never  Emotional Status Pt's affect, behavior and adjustment status: Pt has always been independent and taken care of herself, family and others. She is one who is on 24/7 a day and realizes now she will need to slow down and change her working habits, stress and put herself first. Recent Psychosocial Issues: other health issues and stress from work Psychiatric History: Hx-anxiety was not taking medications for this but may be willing to now especially since this stroke Substance Abuse History: No issues  Patient / Family Perceptions, Expectations & Goals Pt/Family understanding of illness & functional limitations: Pt and husband can explain her stroke and deficits and are hopeful she will do well here. Both have spoken with the MD and feel they have a good understanding of her plan moving forward. Premorbid pt/family roles/activities: Wife, mom, employee, daughter, neighbor, friend, etc Anticipated changes in roles/activities/participation: resume Pt/family expectations/goals: Pt states: " I hope to get rid of this headache I have had since this happen and to do well here." Husband states: " I hope she does well she does not like to ask for  help from  others."  [employer]: None Premorbid Home Care/DME Agencies: None Transportation available at discharge: husband Is the patient able to respond to transportation needs?: Yes In the past 12 months, has lack of transportation kept you from medical appointments or from getting medications?: No In the past 12 months, has lack of transportation kept you from meetings, work, or from getting things needed for daily living?: No Resource referrals recommended: Neuropsychology  Discharge Planning Living Arrangements: Spouse/significant other, Children Support Systems: Spouse/significant other, Children, Parent, Other relatives, Friends/neighbors, Church/faith community Type of Residence: Private residence Insurance Resources: Multimedia programmer (specify) Nurse, mental health) Financial Screen Referred: No Living Expenses: Medical laboratory scientific officer Management: Patient, Spouse Does the patient have any problems obtaining your medications?: No Home Management: both Patient/Family Preliminary Plans: Return home with husband and son, currently coming up with a plan for discharge and pt's care. Aware will intially need 24/7 care. Husband may take FMLA. Aware being evaluated today Care Coordinator Barriers to Discharge: Insurance for SNF coverage Care Coordinator Anticipated Follow Up Needs: HH/OP  Clinical Impression Pleasant female who is ready for therapies but still has a headache she has had since this happened. Her husband is present and will observe in therapies today. Seems to have good supports and will hopefully do well here. Will provide support and work on discharge needs. Aware to bring FMLA or STD forms to be completed if needed. Would benefit from seeing neuro-psych while here  Elease Hashimoto 09/21/2022, 9:55 AM

## 2022-09-21 NOTE — Evaluation (Signed)
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 (HCC) Active Problems:   Thalamic hemorrhage (HCC)   Past Medical History:  Past Medical History:  Diagnosis Date   Anxiety    Chronic right shoulder pain    Gastrocnemius strain, left    did outpatient Tx w/ improvement   Hypertension    Insomnia    Past Surgical History:  Past Surgical History:  Procedure Laterality Date   ABDOMINAL HYSTERECTOMY     CESAREAN SECTION      Assessment & Plan Clinical Impression: Patient is a 52 year old female with history of HTN--no meds X "years", chronic right shoulder pain (took aleve 4 pills/day) who was admitted on 09/16/22 from work with sudden onset of left facial droop, LUE/LLE weakness with decreased sensation and problems speaking. BP 210/110 at admission and CT head done showing acute hemorrhage in right basal ganglia with extending and overlying corona radiata with extension into right lateral, 3rd and 4th ventricles without hydro. CTA negative for vascular malformation and showed patent vessels. She was started on Cleveprex with SBP goal 130-150 and transferred to [facility] for management. Follow up MRI brain showed no lesion or infarct underlying thalamic hematoma and areas of T2 hyperintensity with periventricular and pontine involvement--question symptoms of MS v/s small vessel disease.   2D echo showed EF 60-65% with no wall abnormality and mild right  ventricular wall thickness.    Dr. Roda Shutters felt that stroke was secondary to uncontrolled HTN and oral meds added for better control. Lethargy resolving. She needed supplemental oxygen due to issues with hypoxia and reports difficulty breathing felt to be due to allergies. She has had issues with N/V and developed severe headaches. Last CT head 01/08 showed stable 1.6 X 1.0 X 1.9 cm decrease in IVH and mild surrounding edema with partial effacement of right lateral ventricle without shift or developing hydrocephalus.  Patient transferred to CIR on 09/20/2022 .   Patient currently requires mod with mobility secondary to muscle weakness, decreased cardiorespiratoy endurance, motor apraxia and decreased coordination, decreased initiation, decreased attention, decreased awareness, decreased problem solving, decreased safety awareness, decreased memory, and delayed processing, and decreased sitting balance, decreased standing balance, decreased postural control, hemiplegia, and decreased balance strategies.  Prior to hospitalization, patient was independent  and active with mobility and lived with Spouse in a House home.  Home access is 3Stairs to enter.  Patient will benefit from skilled PT intervention to maximize safe functional mobility, minimize fall risk, and decrease caregiver burden for planned discharge home with 24 hour assist.  Anticipate patient will benefit from follow up OP at discharge.  PT - End of Session Activity Tolerance: Tolerates < 10 min activity, no significant change in vital signs Endurance Deficit: Yes Endurance Deficit Description: Pt with eyes closed majority of session, very fatigued PT Assessment Rehab Potential (ACUTE/IP ONLY): Good PT Barriers to Discharge: Home environment access/layout;Insurance for SNF coverage;Weight PT Patient demonstrates impairments in the following area(s): Balance;Endurance;Motor;Safety;Sensory;Skin Integrity;Perception PT Transfers Functional  Problem(s): Bed Mobility;Bed to Chair;Car PT Locomotion Functional Problem(s): Ambulation;Stairs PT Plan  PT Intensity: Minimum of 1-2 x/day ,45 to 90 minutes PT Frequency: 5 out of 7 days PT Duration Estimated Length of Stay: 2 weeks PT Treatment/Interventions: Ambulation/gait training;Cognitive remediation/compensation;Discharge planning;DME/adaptive equipment instruction;Functional mobility training;Pain management;Psychosocial support;Splinting/orthotics;Therapeutic Activities;UE/LE Strength taining/ROM;Visual/perceptual remediation/compensation;Wheelchair propulsion/positioning;UE/LE Coordination activities;Therapeutic Exercise;Stair training;Skin care/wound management;Patient/family education;Neuromuscular re-education;Functional electrical stimulation;Disease management/prevention;Community reintegration;Balance/vestibular training PT Transfers Anticipated Outcome(s): CGA with LRAD PT Locomotion Anticipated Outcome(s): CGA with LRAD PT Recommendation Follow Up Recommendations: Outpatient PT;24 hour supervision/assistance Patient destination: Home Equipment Recommended: To be determined   PT Evaluation Precautions/Restrictions Precautions Precautions: Fall Precaution Comments: L hemi Restrictions Weight Bearing Restrictions: No Pain Interference Pain Interference Pain Effect on Sleep: 3. Frequently Pain Interference with Therapy Activities: 2. Occasionally Pain Interference with Day-to-Day Activities: 3. Frequently Home Living/Prior Functioning Home Living Living Arrangements: Spouse/significant other;Children Available Help at Discharge: Family;Available 24 hours/day Type of Home: House Home Access: Stairs to enter [employer] of Steps: 3 Entrance Stairs-Rails: None Home Layout: Able to live on main level with bedroom/bathroom;Two level;Full bath on main level Bathroom Shower/Tub: Engineer, manufacturing systems: Standard Bathroom Accessibility: Yes  Lives  With: Spouse Prior Function Level of Independence: Independent with basic ADLs;Independent with homemaking with ambulation;Independent with gait;Independent with transfers;Other (comment) (Active, played tennis)  Able to Take Stairs?: Yes Driving: Yes Vocation: Full time employment Vocation RequirementsHydrologist at [employer] Leisure: Hobbies-yes (Comment) Vision/Perception  Vision - History Ability to See in Adequate Light: 0 Adequate Vision - Assessment Eye Alignment: Within Functional Limits Ocular Range of Motion: Within Functional Limits Alignment/Gaze Preference: Within Defined Limits Tracking/Visual Pursuits: Decreased smoothness of horizontal tracking;Decreased smoothness of vertical tracking Saccades: Impaired - to be further tested in functional context Convergence: Impaired - to be further tested in functional context Perception Perception: Impaired Inattention/Neglect: Does not attend to left side of body Praxis Praxis: Impaired Praxis Impairment Details: Initiation;Motor planning  Cognition Overall Cognitive Status: Impaired/Different from baseline Arousal/Alertness: Awake/alert Orientation Level: Oriented to person;Oriented to place;Oriented to situation Attention: Focused;Sustained Focused Attention: Impaired Focused Attention Impairment: Verbal basic;Functional basic Sustained Attention: Impaired Sustained Attention Impairment: Verbal basic;Functional basic Memory: Impaired Memory Impairment: Retrieval deficit;Decreased recall of new information;Decreased short term memory;Decreased long term memory Decreased Long Term Memory: Verbal basic;Functional basic Decreased Short Term Memory: Verbal basic;Functional basic Awareness: Impaired Awareness Impairment: Emergent impairment Problem Solving: Impaired Problem Solving Impairment: Verbal basic;Functional basic Initiating: Impaired Initiating Impairment: Verbal basic;Functional basic Behaviors:  Poor frustration tolerance Safety/Judgment: Impaired Comments: Decreased insight and awareness into impairments Sensation Sensation Light Touch: Impaired by gross assessment Hot/Cold: Appears Intact Proprioception: Impaired by gross assessment Stereognosis: Not tested Additional Comments: Reports impaired sensation on L side - diminished but detects light touch consistently Coordination Coordination and Movement Description: L hemi, L inattention, decreased awareness Motor  Motor Motor: Hemiplegia;Motor apraxia   Trunk/Postural Assessment  Cervical Assessment Cervical Assessment: Within Functional Limits Thoracic Assessment Thoracic Assessment: Exceptions to [facility] (rounded shoulders) Lumbar Assessment Lumbar Assessment: Exceptions to [facility] (tilted posteriorly) Postural Control Postural Control: Deficits on evaluation Trunk Control: delayed Righting Reactions: insufficient  Balance Balance Balance Assessed: Yes Static Sitting Balance Static Sitting - Balance Support: Feet supported Static Sitting - Level of Assistance: 5: Stand by assistance Dynamic Sitting Balance Dynamic Sitting - Balance Support: Feet supported Dynamic Sitting - Level of Assistance: 3: Mod assist Static Standing Balance Static Standing - Balance Support: Bilateral upper extremity supported Static Standing - Level of Assistance: 4: Min assist Dynamic Standing Balance Dynamic Standing - Balance Support: Bilateral upper extremity supported Dynamic Standing - Level of  Assistance: 3: Mod assist Extremity Assessment      RLE Assessment RLE Assessment: Within Functional Limits LLE Assessment LLE Assessment: Exceptions to [facility] LLE Strength LLE Overall Strength: Deficits Left Hip Flexion: 2-/5 Left Hip Extension: 2-/5 Left Hip ABduction: 2-/5 Left Hip ADduction: 2/5 Left Knee Flexion: 2-/5 Left Knee Extension: 2+/5 Left Ankle Dorsiflexion: 2-/5  Care Tool Care Tool Bed Mobility Roll left and right  activity   Roll left and right assist level: Minimal Assistance - Patient > 75%    Sit to lying activity   Sit to lying assist level: Maximal Assistance - Patient 25 - 49%    Lying to sitting on side of bed activity   Lying to sitting on side of bed assist level: the ability to move from lying on the back to sitting on the side of the bed with no back support.: Moderate Assistance - Patient 50 - 74%     Care Tool Transfers Sit to stand transfer   Sit to stand assist level: Moderate Assistance - Patient 50 - 74%    Chair/bed transfer   Chair/bed transfer assist level: Moderate Assistance - Patient 50 - 74%     Physiological scientist transfer assist level: Moderate Assistance - Patient 50 - 74%      Care Tool Locomotion Ambulation   Assist level: Moderate Assistance - Patient 50 - 74% Assistive device: Parallel bars Max distance: 8 ft  Walk 10 feet activity Walk 10 feet activity did not occur: Safety/medical concerns       Walk 50 feet with 2 turns activity Walk 50 feet with 2 turns activity did not occur: Safety/medical concerns      Walk 150 feet activity Walk 150 feet activity did not occur: Safety/medical concerns      Walk 10 feet on uneven surfaces activity Walk 10 feet on uneven surfaces activity did not occur: Safety/medical concerns      Stairs   Assist level: Maximal Assistance - Patient 25 - 49% Stairs assistive device: 2 hand rails Max number of stairs: 12  Walk up/down 1 step activity   Walk up/down 1 step (curb) assist level: Maximal Assistance - Patient 25 - 49% Walk up/down 1 step or curb assistive device: 2 hand rails  Walk up/down 4 steps activity   Walk up/down 4 steps assist level: Maximal Assistance - Patient 25 - 49% Walk up/down 4 steps assistive device: 2 hand rails  Walk up/down 12 steps activity   Walk up/down 12 steps assist level: Maximal Assistance - Patient 25 - 49% Walk up/down 12 steps assistive device: 2 hand  rails  Pick up small objects from floor Pick up small object from the floor (from standing position) activity did not occur: Safety/medical concerns      Wheelchair Is the patient using a wheelchair?: Yes Type of Wheelchair: Manual   Wheelchair assist level: Dependent - Patient 0%    Wheel 50 feet with 2 turns activity   Assist Level: Dependent - Patient 0%  Wheel 150 feet activity   Assist Level: Dependent - Patient 0%    Refer to Care Plan for Long Term Goals  SHORT TERM GOAL WEEK 1 PT Short Term Goal 1 (Week 1): Pt will complete bed mobility with minA PT Short Term Goal 2 (Week 1): Pt will complete bed<>chair transfers with minA PT Short Term Goal 3 (Week 1): Pt will ambulate 50ft with minA and LRAD  Recommendations for other services: None   Skilled Therapeutic Intervention Mobility Bed Mobility Bed Mobility: Supine to Sit;Sit to Supine Supine to Sit: Moderate Assistance - Patient 50-74% Sit to Supine: Moderate Assistance - Patient 50-74% Transfers Transfers: Sit to Stand;Stand to Sit;Squat Pivot Transfers;Stand Pivot Transfers Sit to Stand: Moderate Assistance - Patient 50-74% Stand to Sit: Moderate Assistance - Patient 50-74% Stand Pivot Transfers: Moderate Assistance - Patient 50 - 74% Stand Pivot Transfer Details: Verbal cues for sequencing;Verbal cues for precautions/safety;Verbal cues for gait pattern;Verbal cues for technique;Manual facilitation for weight shifting;Verbal cues for safe use of DME/AE;Manual facilitation for placement;Tactile cues for initiation;Tactile cues for sequencing;Tactile cues for weight shifting;Tactile cues for posture;Visual cues/gestures for precautions/safety Squat Pivot Transfers: Moderate Assistance - Patient 50-74% Transfer (Assistive device): 1 person hand held assist Locomotion  Gait Ambulation: Yes Gait Assistance: Moderate Assistance - Patient 50-74% Gait Distance (Feet): 8 Feet Assistive device: Parallel bars Gait Assistance  Details: Tactile cues for initiation;Tactile cues for sequencing;Tactile cues for weight shifting;Tactile cues for posture;Tactile cues for placement;Verbal cues for precautions/safety;Verbal cues for gait pattern;Manual facilitation for weight shifting;Verbal cues for technique;Tactile cues for weight beaing;Manual facilitation for placement;Manual facilitation for weight bearing;Verbal cues for sequencing Gait Gait: Yes Gait Pattern: Impaired Gait Pattern: Step-to pattern;Decreased step length - left;Decreased stance time - left;Decreased stride length;Decreased dorsiflexion - left;Decreased weight shift to left;Left flexed knee in stance;Poor foot clearance - left;Narrow base of support;Trunk flexed;Trunk rotated posteriorly on left Stairs / Additional Locomotion Stairs: Yes Stairs Assistance: Maximal Assistance - Patient 25 - 49% Stair Management Technique: Two rails;Step to pattern;Forwards Number of Stairs: 12 Height of Stairs: 4 (x4, 6inch steps + x8, 3inch steps) Wheelchair Mobility Wheelchair Mobility: No  Skilled Intervention: Pt sleeping in bed on arrival - husband at bedside who assisted answering PLOF and social factors. Pt awakens to voice but has difficult staying awake - reports feeling sleepy and fatigued - husband confirms this has been true since her CVA.   Initiated functional mobility as outlined above. Pt needing frequent rest breaks 2/2 fatigue. Cognitively, she presents with impaired initiation, processing, and memory. She has some word finding difficulties as well. She presents with L inattention, L sensory deficits, L sided weakness, decreased sitting and standing balance, and gait impairments. She grossly requires modA for functional mobility and maxA for climbing stairs.   Concluded session in bed with all needs met, alarm on, and family at the bedside.    Discharge Criteria: Patient will be discharged from PT if patient refuses treatment 3 consecutive times without  medical reason, if treatment goals not met, if there is a change in medical status, if patient makes no progress towards goals or if patient is discharged from hospital.  The above assessment, treatment plan, treatment alternatives and goals were discussed and mutually agreed upon: by patient  Alger Simons PT, DPT 09/21/2022, 11:28 AM

## 2022-09-21 NOTE — Evaluation (Signed)
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 ([place]) Active Problems:   Thalamic hemorrhage ([place])  Past Medical History:  Past Medical History:  Diagnosis Date   Anxiety    Chronic right shoulder pain    Gastrocnemius strain, left    did outpatient Tx w/ improvement   Hypertension    Insomnia    Past Surgical History:  Past Surgical History:  Procedure Laterality Date   ABDOMINAL HYSTERECTOMY     CESAREAN SECTION      Assessment / Plan / Recommendation Clinical Impression Kelly Stephenson is a 52 year old female with history of HTN--no meds X "years", chronic right shoulder pain (took aleve 4 pills/day) who was admitted on 09/16/22 from work with sudden onset of left facial droop, LUE/LLE weakness with decreased sensation and problems speaking. BP 210/110 at admission and CT head done showing acute hemorrhage in right basal ganglia with extending and overlying corona radiata with extension into right lateral, 3rd and 4th ventricles without hydro. Dr. Erlinda Hong felt that stroke was secondary to uncontrolled HTN and oral meds added for better control.   Pt presents with cognitive-linguistic deficits in the areas of orientation, attention, memory, problem solving, and intellectual awareness. Pt was limited by lethargy and headache which have been ongoing per family report. Pt's expressive and receptive language skills appeared grossly intact for all tasks assessed. Speech was fluent and  intelligible at the conversation level without clinical signs or symptoms of dysarthria. OME WFL. Pt was observed consuming regular textures and thin liquids without clinical s/sx of dysphagia. Pt reported "things feel funny" however this does not appear attributed to swallow function. Family reports minimal intake however pt reported appetite was improving as of today and requested soup and peanut butter crackers. Ongoing cognitive-linguistic evaluation would be beneficial due to limitations from lethargy today. Recommend continued SLP intervention in CIR to maximize cognitive function and functional independence.    Skilled Therapeutic Interventions          Pt participated in informal cognitive-linguistic, speech, and language evaluations. Please see report for details.    SLP Assessment  Patient will need skilled [place] [facility] during CIR admission    Recommendations  Patient destination: Home Follow up Recommendations: Other (comment) (TBD) Equipment Recommended: None recommended by SLP    SLP Frequency 3 to 5 out of 7 days   SLP Duration  SLP Intensity  SLP Treatment/Interventions 14-16 days  Minumum of 1-2 x/day, 30 to 90 minutes  Cognitive remediation/compensation;Internal/external aids;Functional tasks;Patient/family education;Therapeutic Activities    Pain    Prior Functioning Cognitive/Linguistic Baseline: Within functional limits Type of Home: House  Lives With: Spouse Available Help at Discharge: Family;Available 24 hours/day Vocation: Full time employment  SLP Evaluation Cognition Overall Cognitive Status: Impaired/Different from baseline Arousal/Alertness: Lethargic Orientation Level: Oriented to person;Oriented to place;Disoriented to time;Oriented to situation Year: 2024 Month:  ("not sure") Day of Week: Incorrect Attention: Focused Focused Attention: Impaired Focused Attention Impairment: Functional basic Sustained Attention:  Impaired Sustained Attention Impairment: Functional basic Memory: Impaired Memory Impairment: Retrieval deficit;Decreased recall of new  information;Decreased short term memory;Decreased long term memory Decreased Long Term Memory: Verbal basic;Functional basic Decreased Short Term Memory: Functional basic;Functional complex Awareness: Impaired Awareness Impairment: Emergent impairment Problem Solving: Impaired Problem Solving Impairment: Verbal basic Behaviors: Restless Safety/Judgment: Impaired  Comprehension Auditory Comprehension Overall Auditory Comprehension: Appears within functional limits for tasks assessed Expression Expression Primary Mode of Expression: Verbal Verbal Expression Overall Verbal Expression: Appears within functional limits for tasks assessed Interfering Components: Attention Oral Motor Oral Motor/Sensory Function Overall Oral Motor/Sensory Function: Within functional limits Motor Speech Overall Motor Speech: Appears within functional limits for tasks assessed Articulation: Within functional limitis Intelligibility: Intelligible  Care Tool Care Tool Cognition Ability to hear (with hearing aid or hearing appliances if normally used Ability to hear (with hearing aid or hearing appliances if normally used): 0. Adequate - no difficulty in normal conservation, social interaction, listening to TV   Expression of Ideas and Wants Expression of Ideas and Wants: 3. Some difficulty - exhibits some difficulty with expressing needs and ideas (e.g, some words or finishing thoughts) or speech is not clear   Understanding Verbal and Non-Verbal Content Understanding Verbal and Non-Verbal Content: 3. Usually understands - understands most conversations, but misses some part/intent of message. Requires cues at times to understand  Memory/Recall Ability Memory/Recall Ability : Current season;That he or she is in a hospital/hospital unit   Intelligibility:  Intelligible  Short Term Goals: Week 1: SLP Short Term Goal 1 (Week 1): Patient will participate in further cognitive-linguistic evaluation to full completion for ongoing goal development SLP Short Term Goal 2 (Week 1): Patient will orient x4 with min A for use of external aids SLP Short Term Goal 3 (Week 1): Patient will utilize memory compensations to recall biographical and/or novel information with mod A verbal/visual cues SLP Short Term Goal 4 (Week 1): Patient will sustain attention to functional tasks for 3 minute intervals with mod A verbal redirection cues SLP Short Term Goal 5 (Week 1): Patient will complete mildlt complex problem solving with mod A verbal/visual cues to achieve 75% accuracy  Refer to Care Plan for Long Term Goals  Recommendations for other services: None   Discharge Criteria: Patient will be discharged from SLP if patient refuses treatment 3 consecutive times without medical reason, if treatment goals not met, if there is a change in medical status, if patient makes no progress towards goals or if patient is discharged from hospital.  The above assessment, treatment plan, treatment alternatives and goals were discussed and mutually agreed upon: by patient and by family  Patty Sermons 09/21/2022, 4:57 PM

## 2022-09-21 NOTE — Evaluation (Signed)
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 (HCC) Active Problems:   Thalamic hemorrhage (HCC)   Past Medical History:  Past Medical History:  Diagnosis Date   Anxiety    Chronic right shoulder pain    Gastrocnemius strain, left    did outpatient Tx w/ improvement   Hypertension    Insomnia    Past Surgical History:  Past Surgical History:  Procedure Laterality Date   ABDOMINAL HYSTERECTOMY     CESAREAN SECTION      Assessment & Plan Clinical Impression: Kelly Stephenson is a 52 year old female with history of HTN--no meds X "years", chronic right shoulder pain (took aleve 4 pills/day) who was admitted on 09/16/22 from work with sudden onset of left facial droop, LUE/LLE weakness with decreased sensation and problems speaking. BP 210/110 at admission and CT head done showing acute hemorrhage in right basal ganglia with extending and overlying corona radiata with extension into right lateral, 3rd and 4th ventricles without hydro. CTA negative for vascular malformation and showed patent vessels. She was started on Cleveprex with SBP goal 130-150 and transferred to [facility] for management. Follow up MRI brain showed no lesion or infarct underlying thalamic hematoma and areas of T2 hyperintensity with periventricular and pontine involvement--question symptoms of MS v/s small vessel disease.   2D echo  showed EF 60-65% with no wall abnormality and mild right ventricular wall thickness.    Dr. Roda Shutters felt that stroke was secondary to uncontrolled HTN and oral meds added for better control. Lethargy resolving. She needed supplemental oxygen due to issues with hypoxia and reports difficulty breathing felt to be due to allergies. She has had issues with N/V and developed severe headaches. Last CT head 01/08 showed stable 1.6 X 1.0 X 1.9 cm decrease in IVH and mild surrounding edema with partial effacement of right lateral ventricle without shift or developing hydrocephalus.  P.  Patient transferred to CIR on 09/20/2022 .    Patient currently requires max with basic self-care skills secondary to muscle weakness, abnormal tone, motor apraxia, decreased coordination, and decreased motor planning, decreased visual acuity, decreased visual perceptual skills, and decreased visual motor skills, decreased attention to left and decreased motor planning, decreased attention, decreased awareness, decreased problem solving, decreased safety awareness, decreased memory, and delayed processing, central origin, and decreased sitting balance, decreased standing balance, decreased postural control, hemiplegia, and decreased balance strategies.  Prior to hospitalization, patient could complete ADL/IADL with independent .  Patient will benefit from skilled intervention to decrease level of assist with basic self-care skills, increase independence with basic self-care skills, and increase level of independence with iADL prior to discharge home with care partner.  Anticipate patient will require minimal physical assistance and follow up outpatient.  OT - End of Session Activity Tolerance: Decreased this session Endurance Deficit: Yes Endurance Deficit Description: Patient with motion sensitivity, lethargy, maintains eyes closed - husband reports she sleeps during day and not at night in hospital OT Assessment Rehab Potential (ACUTE  ONLY): Excellent OT Patient demonstrates  impairments in the following area(s): Balance;Sensory;Behavior;Cognition;Endurance;Motor;Pain;Perception;Safety;Vision OT Basic ADL's Functional Problem(s): Eating;Grooming;Bathing;Dressing;Toileting OT Transfers Functional Problem(s): Toilet;Tub/Shower OT Additional Impairment(s): Fuctional Use of Upper Extremity OT Plan OT Intensity: Minimum of 1-2 x/day, 45 to 90 minutes OT Frequency: 5 out of 7 days OT Duration/Estimated Length of Stay: 14-16 days OT Treatment/Interventions: Balance/vestibular training;Neuromuscular re-education;Self Care/advanced ADL retraining;Therapeutic Exercise;Cognitive remediation/compensation;DME/adaptive equipment instruction;Pain management;UE/LE Strength taining/ROM;Functional electrical stimulation;Patient/family education;Splinting/orthotics;UE/LE Coordination activities;Discharge planning;Functional mobility training;Therapeutic Activities;Visual/perceptual remediation/compensation OT Self Feeding Anticipated Outcome(s): Mod I OT Basic Self-Care Anticipated Outcome(s): Mod I OT Toileting Anticipated Outcome(s): Mod I OT Bathroom Transfers Anticipated Outcome(s): Min assist OT Recommendation Recommendations for Other Services: Neuropsych consult Patient destination: Home Follow Up Recommendations: Outpatient OT Equipment Recommended: To be determined Equipment Details: tub/shower combination   OT Evaluation Precautions/Restrictions  Precautions Precautions: Fall Precaution Comments: L hemi Restrictions Weight Bearing Restrictions: No Pain Pain Assessment Pain Scale: 0-10 Pain Score: 4  Pain Type: Acute pain Pain Location: Head Pain Orientation: Anterior Pain Descriptors / Indicators: Headache Pain Frequency: Intermittent Pain Onset: On-going Pain Intervention(s): Medication (See eMAR);[place] expects to be discharged to:: Private  residence Living Arrangements: Spouse/significant other, Children Available Help at Discharge: Family, Available 24 hours/day Type of Home: House Home Access: Stairs to enter Technical brewer of Steps: 3 Entrance Stairs-Rails: None Home Layout: Able to live on main level with bedroom/bathroom, Two level, Full bath on main level Bathroom Shower/Tub: Government social research officer Accessibility: Yes  Lives With: Spouse IADL History Occupation: Full time employment 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 Prior Function Level of Independence: Independent with basic ADLs, Independent with homemaking with ambulation, Independent with gait, Independent with transfers, Other (comment)  Able to Take Stairs?: Yes Driving: Yes Vocation: Full time employment Vocation Requirements: Freight forwarder at [employer]: Hobbies-yes (Comment) Vision Baseline Vision/History: 1 Wears glasses Ability to See in Adequate Light: 0 Adequate Patient Visual Report: Blurring of vision (reports floaters in R eye) Vision Assessment?: Yes Eye Alignment: Within Functional Limits Ocular Range of Motion: Within Functional Limits Alignment/Gaze Preference: Within Defined Limits Tracking/Visual Pursuits: Decreased smoothness of horizontal tracking;Decreased smoothness of vertical tracking Saccades: Impaired - to be further tested in functional context Convergence: Impaired - to be further tested in functional context Visual Fields: Left visual field deficit Additional Comments: Needs further assessment for gaze stabilization Perception  Perception: Impaired Inattention/Neglect: Does not attend to left side of body Praxis Praxis: Impaired Praxis Impairment Details: Initiation;Motor planning Cognition Cognition Overall Cognitive Status: Impaired/Different from baseline Arousal/Alertness: Lethargic Orientation Level: Person;Place Memory: Impaired Memory  Impairment: Retrieval deficit;Decreased recall of new information;Decreased short term memory;Decreased long term memory Decreased Long Term Memory: Verbal basic;Functional basic Decreased Short Term Memory: Functional basic;Functional complex Attention: Sustained;Selective Focused Attention: Impaired Focused Attention Impairment: Functional basic Sustained Attention: Impaired Sustained Attention Impairment: Functional basic Selective Attention: Impaired Selective Attention Impairment: Functional basic Awareness: Impaired Awareness Impairment: Emergent impairment Problem Solving: Impaired Problem Solving Impairment: Functional complex Executive Function: Organizing;Initiating;Self Monitoring;Self Correcting Organizing: Impaired Organizing Impairment: Functional basic Initiating: Impaired Initiating Impairment: Functional basic Self Monitoring: Impaired Self Monitoring Impairment: Functional basic Self Correcting: Impaired Self Correcting Impairment: Functional basic Behaviors: Poor frustration tolerance;Restless Safety/Judgment: Impaired Comments: Decreased insight and awareness into impairments Brief Interview for Mental Status (BIMS) Repetition of Three Words (First Attempt): 3 Temporal Orientation: Year: Correct Temporal Orientation: Month: Accurate within 5 days Temporal Orientation: Day: Incorrect Recall: "Sock": Yes, no cue required Recall: "Blue": Yes, after cueing ("a color") Recall: "  Bed": Yes, after cueing ("a piece of furniture") BIMS Summary Score: 12 Sensation Sensation Light Touch: Impaired by gross assessment Hot/Cold: Appears Intact Proprioception: Impaired by gross assessment Stereognosis: Not tested Additional Comments: Reports impaired sensation on L side - diminished but detects light touch consistently Coordination Gross Motor Movements are Fluid and Coordinated: No Fine Motor Movements are Fluid and Coordinated: No Coordination and Movement  Description: L hemi, L inattention, decreased awareness Motor  Motor Motor: Hemiplegia;Motor apraxia;Abnormal postural alignment and control;Motor impersistence  Trunk/Postural Assessment  Cervical Assessment Cervical Assessment: Within Functional Limits Thoracic Assessment Thoracic Assessment: Exceptions to [facility] (thoracic flexion) Lumbar Assessment Lumbar Assessment: Exceptions to [facility] (posterior pelvis) Postural Control Postural Control: Deficits on evaluation Trunk Control: delayed Righting Reactions: insufficient  Balance Balance Balance Assessed: Yes Static Sitting Balance Static Sitting - Balance Support: Feet supported Static Sitting - Level of Assistance: 5: Stand by assistance Dynamic Sitting Balance Dynamic Sitting - Balance Support: Feet supported Dynamic Sitting - Level of Assistance: 3: Mod assist Static Standing Balance Static Standing - Balance Support: Bilateral upper extremity supported Static Standing - Level of Assistance: 4: Min assist Dynamic Standing Balance Dynamic Standing - Balance Support: Bilateral upper extremity supported Dynamic Standing - Level of Assistance: 3: Mod assist Dynamic Standing - Balance Activities: Lateral lean/weight shifting;Forward lean/weight shifting Extremity/Trunk Assessment RUE Assessment RUE Assessment: Within Functional Limits LUE Assessment LUE Assessment: Exceptions to [facility] Active Range of Motion (AROM) Comments: Displays ~ 40* shoulder flexion - motor impersistence.  Has active movement at all joints - delayed LUE Body System: Neuro Brunstrum levels for arm and hand: Arm;Hand Brunstrum level for arm: Stage II Synergy is developing Brunstrum level for hand: Stage II Synergy is developing  Care Tool Care Tool Self Care Eating Eating activity did not occur: Refused      Oral Care    Oral Care Assist Level: Moderate Assistance - Patient 50 - 74%    Bathing   Body parts bathed by patient: Chest;Abdomen;Face Body  parts bathed by helper: Right lower leg;Left lower leg;Right arm;Left arm;Front perineal area;Buttocks   Assist Level: Moderate Assistance - Patient 50 - 74%    Upper Body Dressing(including orthotics)   What is the patient wearing?: Pull over shirt   Assist Level: Moderate Assistance - Patient 50 - 74%    Lower Body Dressing (excluding footwear)   What is the patient wearing?: Underwear/pull up;Pants Assist for lower body dressing: Maximal Assistance - Patient 25 - 49%    Putting on/Taking off footwear   What is the patient wearing?: Socks Assist for footwear: Total Assistance - Patient < 25%       Care Tool Toileting Toileting activity   Assist for toileting: Moderate Assistance - Patient 50 - 74%     Care Tool Bed Mobility Roll left and right activity   Roll left and right assist level: Moderate Assistance - Patient 50 - 74%    Sit to lying activity   Sit to lying assist level: Maximal Assistance - Patient 25 - 49%    Lying to sitting on side of bed activity   Lying to sitting on side of bed assist level: the ability to move from lying on the back to sitting on the side of the bed with no back support.: Moderate Assistance - Patient 50 - 74%     Care Tool Transfers Sit to stand transfer   Sit to stand assist level: Moderate Assistance - Patient 50 - 74%    Chair/bed transfer  Chair/bed transfer assist level: Moderate Assistance - Patient 50 - 74%     Toilet transfer   Assist Level: Moderate Assistance - Patient 50 - 74%     Care Tool Cognition  Expression of Ideas and Wants Expression of Ideas and Wants: 4. Without difficulty (complex and basic) - expresses complex messages without difficulty and with speech that is clear and easy to understand  Understanding Verbal and Non-Verbal Content Understanding Verbal and Non-Verbal Content: 3. Usually understands - understands most conversations, but misses some part/intent of message. Requires cues at times to  understand   Memory/Recall Ability Memory/Recall Ability : Current season;That he or she is in a hospital/hospital unit   Refer to Care Plan for [place] 1 OT Short Term Goal 1 (Week 1): Patient will bathe upper body with min assist OT Short Term Goal 2 (Week 1): Patient will bathe lower body with mod assist OT Short Term Goal 3 (Week 1): Patient will complete toilet transfer with min assist OT Short Term Goal 4 (Week 1): Patient will don/doff pull over shirt with min assist OT Short Term Goal 5 (Week 1): Patient will don/doff elastic waist pants with mod assist  Recommendations for other services: Neuropsych   Skilled Therapeutic Intervention: Patient received supine in bed - sleeping soundly, difficult to arouse.  Husband in recliner chair also sleeping soundly.  Gently bringing up lights, and giving patient time to wake.  Patient agreeable to shower.  Patient needs mod assist to transition to sitting edge of bed.  Transferred squat pivot to wheelchair, and to bathroom.  Patient frequently closing eyes - reports spots in R eye - eyes open or closed.  ADL as indicated.  Educated patient on OT goals, and rehabilitation in general.   Patient retunred to bed with bed alarm set.  Oriented to nursing call bell - left within reach.   ADL ADL Eating: Not assessed Grooming: Moderate assistance Where Assessed-Grooming: Sitting at sink Upper Body Bathing: Moderate assistance Where Assessed-Upper Body Bathing: Shower Lower Body Bathing: Maximal assistance Where Assessed-Lower Body Bathing: Shower Upper Body Dressing: Moderate assistance Where Assessed-Upper Body Dressing: Sitting at sink Lower Body Dressing: Maximal assistance Where Assessed-Lower Body Dressing: Chair Toileting: Moderate assistance Where Assessed-Toileting: Glass blower/designer: Moderate assistance Toilet Transfer Method: Engineer, water: Bedside commode Tub/Shower  Transfer: Unable to assess Tub/Shower Transfer Method: Unable to assess Social research officer, government: Moderate assistance Social research officer, government Method: Education officer, environmental: Transfer tub bench Mobility  Bed Mobility Bed Mobility: Supine to Sit;Sit to Supine Supine to Sit: Moderate Assistance - Patient 50-74% Sit to Supine: Moderate Assistance - Patient 50-74% Transfers Sit to Stand: Moderate Assistance - Patient 50-74% Stand to Sit: Moderate Assistance - Patient 50-74%   Discharge Criteria: Patient will be discharged from OT if patient refuses treatment 3 consecutive times without medical reason, if treatment goals not met, if there is a change in medical status, if patient makes no progress towards goals or if patient is discharged from hospital.  The above assessment, treatment plan, treatment alternatives and goals were discussed and mutually agreed upon: by patient and by family  [name] 09/21/2022, 12:44 PM

## 2022-09-21 NOTE — Progress Notes (Signed)
PROGRESS NOTE   Subjective/Complaints: C/o headache and this causing insomnia. Discussed increasing topamax and she is agreeable She has no other complaints  ROS: +headache  Objective:   CT HEAD WO CONTRAST (5MM)  Result Date: 09/19/2022 CLINICAL DATA:  Intracranial hemorrhage EXAM: CT HEAD WITHOUT CONTRAST TECHNIQUE: Contiguous axial images were obtained from the base of the skull through the vertex without intravenous contrast. RADIATION DOSE REDUCTION: This exam was performed according to the departmental dose-optimization program which includes automated exposure control, adjustment of the mA and/or kV according to patient size and/or use of iterative reconstruction technique. COMPARISON:  CT head 09/16/2022 FINDINGS: Brain: The 1.6 cm AP x 1.0 cm TV x 1.9 cm cc right thalamic hematoma is unchanged in size. Intraventricular extension of the body of the right lateral ventricle is similar to the prior study; however, blood in the third and fourth ventricles is decreased. There is mild surrounding edema with partial effacement of the adjacent right lateral ventricle but no midline shift. The ventricular system is stable in size and configuration without evidence of developing hydrocephalus. There is no new acute intracranial hemorrhage or extra-axial fluid collection. There is no acute territorial infarct. There is no mass lesion. Vascular: No hyperdense vessel or unexpected calcification. Skull: Normal. Negative for fracture or focal lesion. Sinuses/Orbits: Stable. IMPRESSION: 1. Stable size of the right thalamic hematoma with mild surrounding edema but no midline shift, and decreased intraventricular blood 2. No new acute intracranial pathology. Electronically Signed   By: Valetta Mole M.D.   On: 09/19/2022 20:34   Recent Labs    09/20/22 0423 09/21/22 0556  WBC 10.9* 11.6*  HGB 15.4* 15.9*  HCT 46.1* 47.3*  PLT 331 354   Recent Labs     09/20/22 0423 09/21/22 0556  NA 134* 134*  K 3.9 3.4*  CL 101 98  CO2 23 24  GLUCOSE 117* 96  BUN 18 23*  CREATININE 0.86 1.04*  CALCIUM 9.4 9.6    Intake/Output Summary (Last 24 hours) at 09/21/2022 1009 Last data filed at 09/20/2022 1700 Gross per 24 hour  Intake 0 ml  Output --  Net 0 ml        Physical Exam: Vital Signs Blood pressure (!) 150/83, pulse 79, temperature 98.4 F (36.9 C), resp. rate 19, height 5\' 4"  (1.626 m), weight 87.8 kg, SpO2 97 %. Gen: no distress, normal appearing HEENT: oral mucosa pink and moist, NCAT Cardio: Reg rate Chest: normal effort, normal rate of breathing Abdominal:     General: There is no distension.     Palpations: Abdomen is soft.     Comments: Pt abd is soft, NT, protuberant but not distended per pt- hypoactive BS  Musculoskeletal:     Cervical back: Neck supple. No tenderness.     Comments: RUE 5/5 LUE- biceps 4-/5; triceps 3+/5; Grip 3+/5; FA 2/5 RLE- 5/5 LLE- HF 2/5; KE/KF 2/5; DF/PF 1/5   Skin:    General: Skin is warm and dry.     Comments: Psoriatic patches left great toe, right lateral ankle and left elbow.   Neurological:     Mental Status: She is oriented to person, place, and time and  easily aroused. She is lethargic.     Comments: Speech clear. Was able to state month, year, DOB but not day. Restless and internally distracted by pain and fatigue (lack of sleep but chronic). Left sided weakness with sensory deficits and intention. Left foot drop noted.  Sensation intact to light touch in all 4 extremities and face Hard to assess inattention, etc since so sleepy/tired  Psychiatric:     Comments: Flat, sleepy due to pain meds per family    Assessment/Plan: 1. Functional deficits which require 3+ hours per day of interdisciplinary therapy in a comprehensive inpatient rehab setting. Physiatrist is providing close team supervision and 24 hour management of active medical problems listed below. Physiatrist and  rehab team continue to assess barriers to discharge/monitor patient progress toward functional and medical goals  Care Tool:  Bathing              Bathing assist       Upper Body Dressing/Undressing Upper body dressing        Upper body assist      Lower Body Dressing/Undressing Lower body dressing            Lower body assist       Toileting Toileting    Toileting assist       Transfers Chair/bed transfer  Transfers assist           Locomotion Ambulation   Ambulation assist              Walk 10 feet activity   Assist           Walk 50 feet activity   Assist           Walk 150 feet activity   Assist           Walk 10 feet on uneven surface  activity   Assist           Wheelchair     Assist               Wheelchair 50 feet with 2 turns activity    Assist            Wheelchair 150 feet activity     Assist          Blood pressure (!) 150/83, pulse 79, temperature 98.4 F (36.9 C), resp. rate 19, height 5\' 4"  (1.626 m), weight 87.8 kg, SpO2 97 %.    Medical Problem List and Plan: 1. 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  Antithrombotics: -DVT/anticoagulation:  Pharmaceutical: Lovenox             -antiplatelet therapy: N/A 3. Pain Management:  On oxycodone and Fioricet prn.              --will start sleep chart. Add topamax             If topamax doesn't work, can try Elavil? 4. Mood/Behavior/Sleep: LCSW to follow for evaluation and support.  --Has a lot of stress, anxiety, insomnia-->hasn't wanted to get help per husband.  --chronic insomnia-->up/down multiple times during the night. --antipsychotic agents: N/A 5. Neuropsych/cognition: This patient is capable of making decisions on her own behalf. 6. Skin/Wound Care: Routine pressure relief measures.   7. Fluids/Electrolytes/Nutrition:  Monitor I/O. Check CMET in am. 8. HTN: Monitor BP TID. Was not compliant with medications--does not want meds/doesn't like meds, so was noncompliant at home with meds prescribed.  --Multiple meds added and titrated. Orthostatic symptoms reported.  --On Norvasc, Cozaar, Hydralazine and Lopressor.             --SBP goal 130-150 range.   -at goal, supplement potassium 9. Hyperlipidemia: LDL 206- on Crestor. 10. Hyponatremia: Question SIADH. Monitor I/O. Check CMET in am.  11. Leucocytosis: Likely reactive--continue to trend. 10.3-->13.5-->10.9. Tmax-99.2  --Monitor for fevers and other signs of infection.  12. Acute on chronic Headaches w/ nausea: Took 4 aleve daily.  --Will add Claritin and nose spray to help with congestion as could be exacerbating HA --Increase topamax to 75mg  HS 13. B12 deficiency: given IM B12 today  LOS: 1 days A FACE TO FACE EVALUATION WAS PERFORMED  Piya Mesch P Perle Gibbon 09/21/2022, 10:09 AM

## 2022-09-21 NOTE — Progress Notes (Signed)
Unable to obtain Ortho VS d/t pt having a severe migraine. Will notify oncoming nurse to obtain on day shift.

## 2022-09-22 DIAGNOSIS — I69254 Hemiplegia and hemiparesis following other nontraumatic intracranial hemorrhage affecting left non-dominant side: Secondary | ICD-10-CM | POA: Diagnosis not present

## 2022-09-22 LAB — BASIC METABOLIC PANEL
Anion gap: 11 (ref 5–15)
BUN: 24 mg/dL — ABNORMAL HIGH (ref 6–20)
CO2: 22 mmol/L (ref 22–32)
Calcium: 9.4 mg/dL (ref 8.9–10.3)
Chloride: 102 mmol/L (ref 98–111)
Creatinine, Ser: 1.03 mg/dL — ABNORMAL HIGH (ref 0.44–1.00)
GFR, Estimated: >60 mL/min (ref >60)
Glucose, Bld: 113 mg/dL — ABNORMAL HIGH (ref 70–99)
Potassium: 3.1 mmol/L — ABNORMAL LOW (ref 3.5–5.1)
Sodium: 135 mmol/L (ref 135–145)

## 2022-09-22 LAB — CBC WITH DIFFERENTIAL/PLATELET
Abs Immature Granulocytes: 0.06 10*3/uL (ref 0.00–0.07)
Basophils Absolute: 0.1 10*3/uL (ref 0.0–0.1)
Basophils Relative: 1 %
Eosinophils Absolute: 0.1 10*3/uL (ref 0.0–0.5)
Eosinophils Relative: 1 %
HCT: 44.2 % (ref 36.0–46.0)
Hemoglobin: 15 g/dL (ref 12.0–15.0)
Immature Granulocytes: 1 %
Lymphocytes Relative: 16 %
Lymphs Abs: 1.7 10*3/uL (ref 0.7–4.0)
MCH: 28.2 pg (ref 26.0–34.0)
MCHC: 33.9 g/dL (ref 30.0–36.0)
MCV: 83.1 fL (ref 80.0–100.0)
Monocytes Absolute: 1 10*3/uL (ref 0.1–1.0)
Monocytes Relative: 9 %
Neutro Abs: 7.8 10*3/uL — ABNORMAL HIGH (ref 1.7–7.7)
Neutrophils Relative %: 72 %
Platelets: 362 10*3/uL (ref 150–400)
RBC: 5.32 MIL/uL — ABNORMAL HIGH (ref 3.87–5.11)
RDW: 12.8 % (ref 11.5–15.5)
WBC: 10.7 10*3/uL — ABNORMAL HIGH (ref 4.0–10.5)
nRBC: 0 % (ref 0.0–0.2)

## 2022-09-22 LAB — VITAMIN D 25 HYDROXY (VIT D DEFICIENCY, FRACTURES): Vit D, 25-Hydroxy: 6.52 ng/mL — ABNORMAL LOW (ref 30–100)

## 2022-09-22 MED ORDER — POTASSIUM CHLORIDE CRYS ER 20 MEQ PO TBCR
40.0000 meq | EXTENDED_RELEASE_TABLET | ORAL | Status: AC
  Administered 2022-09-22 (×2): 40 meq via ORAL
  Filled 2022-09-22 (×2): qty 2

## 2022-09-22 MED ORDER — TOPIRAMATE 25 MG PO TABS
100.0000 mg | ORAL_TABLET | Freq: Every day | ORAL | Status: DC
  Administered 2022-09-22 – 2022-09-28 (×7): 100 mg via ORAL
  Filled 2022-09-22 (×7): qty 4

## 2022-09-22 NOTE — Progress Notes (Signed)
Physical Therapy Session Note  Patient Details  Name: Kelly Stephenson MRN: [id number] Date of Birth: 23-Aug-1971  Today's Date: 09/22/2022 PT Individual Time: 0800-0900 + 1300-1400 PT Individual Time Calculation (min): 60 min  + 60 min  Short Term Goals: Week 1:  PT Short Term Goal 1 (Week 1): Pt will complete bed mobility with minA PT Short Term Goal 2 (Week 1): Pt will complete bed<>chair transfers with minA PT Short Term Goal 3 (Week 1): Pt will ambulate 68ft with minA and LRAD  Skilled Therapeutic Interventions/Progress Updates:      1st session: Pt supine in bed awake with her husband present. Pt reports unrated HA - RN in room to admenster morning Rx and pain medication. Pt continues to be sleepy but more awake then yesterday.   Husband reports patient had a rough night - she was awake most of the night and even tried to get out of the bed herself with the rails up. Reports a medication she took last night made her this way - RN in room to overhear and relay to MD.   Supine<>Sitting EOB with modA for trunk support and LLE management. Pt able to sit unsupported at EOB with SBA. Pt with a "loopy" behavior, seems to lack awareness and insight into hospitalization and reason she's here. Presents more as a BI > typical CVA. Pt repeating herself "you all are weird and crazy."   Assisted with donning pants with maxA for threading and pulling over hips in standing. Sit<>Stand with modA with L knee blocked to prevent buckling. Squat<>pivot with minA to w/c towards her stronger R side. Donned socks and tennis shoes with totalA for time management.   Transported to bathroom and assisted to toilet with minA stand<>pivot transfer. Pt continent of bladder - charted. Assist needed for managing pants in standing and then returned to w/c in similar manner, using grab bars for safety. Wheeled sinkside to complete oral care- ++ time needed for problem solving and motor planning. Pt denying assistance  when offered.   Transported to day room rehab gym for time. Assisted onto Nustep with minA squat<>pivot transfer. setup needed for L hemibody. Completed 6.5 minutes at L5 resistance using BLE only to isolate for strengthening. During this activity, worked on re-orientation, explaining reason for hospitalization, and orient to routine of CIR.   Returned to her room and she remained seated in w/c with her husband present at bedside. All needs met.       2nd session: Pt in bed to start - agreeable to PT tx. Drowsy but continues to show improved alertness compared to yesterday.  Supine<>sitting EOB with modA for trunk management - able to sit unsupported at EOB with SBA. Squat<>pivot transfers needing min/modA from EOB to w/c, both directions. Cues for sequencing and setup.  Transported to main rehab hallway and instructed in gait training using L Hand rail and +2 assist for w/c follow for safety. She ambulated 23ft + 84ft with modA, PT on stool to faciliate gait for her paretic LLE. She is able to initiate swing but needs assist for completing adequate step length and for blocking to prevent buckling. Anticipate need for AFO prior to DC.   Worked on sit<>stands, standing balance, postural awareness, and lateral weight shifting - all needing minA until shifting weight to her L side which needs modA for stability. Overhead reaching with RUE to horseshoes on mirror to promote trunk elongation and upright.  Pt needing several seated rest breaks throughout session due  to fatigue and lethargy. Memory deficits present as patient unable to recall events completed this AM session. Also unable to repeat day of the week when instructed to remember. Pt with some ? Lability and inappropriate laughing during session. Needed ++ time for simple tasks and redirection. She continues to be disoriented and lacks full awareness of deficits. Concluded session in bed with alarm on and several family members at the bedside.  Husband assist with repositioning patient in bed. All needs met.    Therapy Documentation Precautions:  Precautions Precautions: Fall Precaution Comments: L hemi Restrictions Weight Bearing Restrictions: No General:     Therapy/Group: Individual Therapy  Kelly Stephenson P Kelly Stephenson PT 09/22/2022, 7:34 AM

## 2022-09-22 NOTE — Progress Notes (Signed)
Occupational Therapy Note  Patient Details  Name: Kelly Stephenson MRN: [id number] Date of Birth: 06-29-1971  Today's Date: 09/22/2022 OT Missed Time: 30 Minutes Missed Time Reason: Patient fatigue  Pt greeted supine in bed with husband present, pt reported fatigue from being up most of the night and having back to back therapies this AM. Pt declined session at this time d/t fatigue.    Corinne [facility] 09/22/2022, 10:49 AM

## 2022-09-22 NOTE — Progress Notes (Addendum)
Speech Language Pathology Daily Session Note  Patient Details  Name: Kelly Stephenson MRN: [id number] Date of Birth: [birth year]  Today's Date: 09/22/2022 SLP Individual Time: 0950-1030 SLP Individual Time Calculation (min): 40 min  Short Term Goals: Week 1: SLP Short Term Goal 1 (Week 1): Patient will participate in further cognitive-linguistic evaluation to full completion for ongoing goal development SLP Short Term Goal 2 (Week 1): Patient will orient x4 with min A for use of external aids SLP Short Term Goal 3 (Week 1): Patient will utilize memory compensations to recall biographical and/or novel information with mod A verbal/visual cues SLP Short Term Goal 4 (Week 1): Patient will sustain attention to functional tasks for 3 minute intervals with mod A verbal redirection cues SLP Short Term Goal 5 (Week 1): Patient will complete mildlt complex problem solving with mod A verbal/visual cues to achieve 75% accuracy  Skilled Therapeutic Interventions: Pt seen this date for skilled ST intervention targeting cognitive goals outlined above. Pt received awake, though visibly fatigued and OOB in w/c; distractible throughout. Agreeable to intervention with encouragement. Taken to quiet tx space given poor sustained attention. Pt remains confused.  Today's session with emphasis on completing additional cognitive-linguistic evaluation given fatigue and pain limited participation on 09/21/2022. This date, Pt only oriented to self; therefore SLP provided calendar and Mod A verbal and visual cues for pt to demonstrate orientation to month, date, year, situation, and location. To address attention, pt completed very basic sorting task (sorting playing cards into colors). Completed basic mental math calculations with playing cards given Max A, faded to Sup A verbal cues as tasked progressed particularly for single digit math; consistent Max A required when adding double and single digits together. Sequenced  numbers 1-10 given Max A faded to Sup A as task progressed. Min A for redirection to task; perseverative on bruising on hand; attended for ~2-3 prior to requiring redirection.  Completed confrontation naming subtest from CLQT and achieved 100% accuracy. Provided pt and family education ST POC and tasks addressed this date; hung calendar in room - all parties verbalized understanding though will need reinforcement. Continue to recommend additional cognitive-linguistic assessment, as pt's attention and tolerance for therapy improves.  Pt returned to room and left OOB in w/c with all safety measures activated and call bell within reach. Continue per current ST POC.   Pain No pain reported; NAD  Therapy/Group: Individual Therapy  Sergi Gellner A Samreen Seltzer 09/22/2022, 12:45 PM

## 2022-09-22 NOTE — Progress Notes (Signed)
PROGRESS NOTE   Subjective/Complaints: C/o headache.  Affect brightens when discussing tennies Slept poorly last night again As per husband no confusion noted, was awake due to headache  ROS: +headache, +insomnia  Objective:   No results found. Recent Labs    09/20/22 0423 09/21/22 0556  WBC 10.9* 11.6*  HGB 15.4* 15.9*  HCT 46.1* 47.3*  PLT 331 354   Recent Labs    09/20/22 0423 09/21/22 0556  NA 134* 134*  K 3.9 3.4*  CL 101 98  CO2 23 24  GLUCOSE 117* 96  BUN 18 23*  CREATININE 0.86 1.04*  CALCIUM 9.4 9.6    Intake/Output Summary (Last 24 hours) at 09/22/2022 0931 Last data filed at 09/21/2022 1700 Gross per 24 hour  Intake 155 ml  Output --  Net 155 ml        Physical Exam: Vital Signs Blood pressure (!) 139/96, pulse 73, temperature 97.8 F (36.6 C), resp. rate 19, height 5\' 4"  (1.626 m), weight 87.8 kg, SpO2 98 %. Gen: no distress, normal appearing HEENT: oral mucosa pink and moist, NCAT Cardio: Reg rate Chest: normal effort, normal rate of breathing Abdominal:     General: There is no distension.     Palpations: Abdomen is soft.     Comments: Pt abd is soft, NT, protuberant but not distended per pt- hypoactive BS  Musculoskeletal:     Cervical back: Neck supple. No tenderness.     Comments: RUE 5/5 LUE- biceps 4-/5; triceps 3+/5; Grip 3+/5; FA 2/5 RLE- 5/5 LLE- HF 2/5; KE/KF 2/5; DF/PF 1/5   Skin:    General: Skin is warm and dry.     Comments: Psoriatic patches left great toe, right lateral ankle and left elbow.   Neurological:     Mental Status: She is oriented to person, place, and time and easily aroused. She is lethargic.     Comments: Speech clear. Was able to state month, year, DOB but not day. Restless and internally distracted by pain and fatigue (lack of sleep but chronic). Left sided weakness with sensory deficits and intention. Left foot drop noted.  Sensation intact to  light touch in all 4 extremities and face Hard to assess inattention, etc since so sleepy/tired  Psychiatric:     Comments: Flat, sleepy due to pain meds per family, affect brightens when discussing tennis   Assessment/Plan: 1. Functional deficits which require 3+ hours per day of interdisciplinary therapy in a comprehensive inpatient rehab setting. Physiatrist is providing close team supervision and 24 hour management of active medical problems listed below. Physiatrist and rehab team continue to assess barriers to discharge/monitor patient progress toward functional and medical goals  Care Tool:  Bathing    Body parts bathed by patient: Chest, Abdomen, Face   Body parts bathed by helper: Right lower leg, Left lower leg, Right arm, Left arm, Front perineal area, Buttocks     Bathing assist Assist Level: Moderate Assistance - Patient 50 - 74%     Upper Body Dressing/Undressing Upper body dressing   What is the patient wearing?: Pull over shirt    Upper body assist Assist Level: Moderate Assistance - Patient 50 -  74%    Lower Body Dressing/Undressing Lower body dressing      What is the patient wearing?: Underwear/pull up, Pants     Lower body assist Assist for lower body dressing: Maximal Assistance - Patient 25 - 49%     Toileting Toileting    Toileting assist Assist for toileting: Moderate Assistance - Patient 50 - 74%     Transfers Chair/bed transfer  Transfers assist     Chair/bed transfer assist level: Moderate Assistance - Patient 50 - 74%     Locomotion Ambulation   Ambulation assist      Assist level: Moderate Assistance - Patient 50 - 74% Assistive device: Parallel bars Max distance: 8 ft   Walk 10 feet activity   Assist  Walk 10 feet activity did not occur: Safety/medical concerns        Walk 50 feet activity   Assist Walk 50 feet with 2 turns activity did not occur: Safety/medical concerns         Walk 150 feet  activity   Assist Walk 150 feet activity did not occur: Safety/medical concerns         Walk 10 feet on uneven surface  activity   Assist Walk 10 feet on uneven surfaces activity did not occur: Safety/medical concerns         Wheelchair     Assist Is the patient using a wheelchair?: Yes Type of Wheelchair: Manual    Wheelchair assist level: Dependent - Patient 0%      Wheelchair 50 feet with 2 turns activity    Assist        Assist Level: Dependent - Patient 0%   Wheelchair 150 feet activity     Assist      Assist Level: Dependent - Patient 0%   Blood pressure (!) 139/96, pulse 73, temperature 97.8 F (36.6 C), resp. rate 19, height 5\' 4"  (1.626 m), weight 87.8 kg, SpO2 98 %.    Medical Problem List and Plan: 1. 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  Antithrombotics: -DVT/anticoagulation:  Pharmaceutical: Lovenox             -antiplatelet therapy: N/A 3. Pain Management:  On oxycodone and Fioricet prn.              --will start sleep chart. Add topamax             If topamax doesn't work, can try Elavil? 4. Mood/Behavior/Sleep: LCSW to follow for evaluation and support.  --Has a lot of stress, anxiety, insomnia-->hasn't wanted to get help per husband.  --chronic insomnia-->up/down multiple times during the night. --antipsychotic agents: N/A 5. Neuropsych/cognition: This patient is capable of making decisions on her own behalf. 6. Skin/Wound Care: Routine pressure relief measures.  7. Fluids/Electrolytes/Nutrition: Monitor I/O. Check CMET in am. 8. HTN: Monitor BP TID. Was not compliant with medications--does not want meds/doesn't like meds, so was noncompliant at home with meds prescribed.  --Multiple meds added and titrated. Orthostatic symptoms reported.  --On Norvasc, Cozaar, Hydralazine and  Lopressor.             --SBP goal 130-150 range.   -at goal, supplement potassium 9. Hyperlipidemia: LDL 206- on Crestor. 10. Hyponatremia: Question SIADH. Recheck sodium  today.  11. Leucocytosis: Likely reactive--continue to trend. 10.3-->13.5-->10.9. Tmax-99.2  --Monitor for fevers and other signs of infection.  12. Acute on chronic Headaches w/ nausea: Took 4 aleve daily.  --Will add Claritin and nose spray to help with congestion as could be exacerbating HA --Increase topamax to 100 mg HS 13. B12 deficiency: given IM B12 given 1/11, discussed that she may benefit from these monthly, or increasing consumption of meat.   LOS: 2 days A FACE TO FACE EVALUATION WAS PERFORMED  Kelly Stephenson 09/22/2022, 9:31 AM

## 2022-09-23 ENCOUNTER — Inpatient Hospital Stay (HOSPITAL_COMMUNITY): Payer: BC Managed Care – PPO

## 2022-09-23 LAB — HOMOCYSTEINE: Homocysteine: 17.3 umol/L — ABNORMAL HIGH (ref 0.0–14.5)

## 2022-09-23 LAB — BASIC METABOLIC PANEL
Anion gap: 10 (ref 5–15)
BUN: 30 mg/dL — ABNORMAL HIGH (ref 6–20)
CO2: 21 mmol/L — ABNORMAL LOW (ref 22–32)
Calcium: 9.2 mg/dL (ref 8.9–10.3)
Chloride: 106 mmol/L (ref 98–111)
Creatinine, Ser: 1.08 mg/dL — ABNORMAL HIGH (ref 0.44–1.00)
GFR, Estimated: >60 mL/min (ref >60)
Glucose, Bld: 114 mg/dL — ABNORMAL HIGH (ref 70–99)
Potassium: 3.9 mmol/L (ref 3.5–5.1)
Sodium: 137 mmol/L (ref 135–145)

## 2022-09-23 LAB — MISC LABCORP TEST (SEND OUT): Labcorp test code: 81950

## 2022-09-23 MED ORDER — MAGNESIUM GLUCONATE 500 MG PO TABS
250.0000 mg | ORAL_TABLET | Freq: Every day | ORAL | Status: DC
  Administered 2022-09-23 – 2022-09-28 (×6): 250 mg via ORAL
  Filled 2022-09-23 (×6): qty 1

## 2022-09-23 MED ORDER — AMLODIPINE BESYLATE 5 MG PO TABS
5.0000 mg | ORAL_TABLET | Freq: Every day | ORAL | Status: DC
  Administered 2022-09-24 – 2022-09-27 (×4): 5 mg via ORAL
  Filled 2022-09-23 (×5): qty 1

## 2022-09-23 MED ORDER — SODIUM CHLORIDE 0.9 % IV SOLN: Freq: Every day | INTRAVENOUS | Status: AC

## 2022-09-23 NOTE — Progress Notes (Signed)
Occupational Therapy Session Note  Patient Details  Name: Kelly Stephenson MRN: [id number] Date of Birth: July 11, 1971  Today's Date: 09/23/2022 OT Individual Time: 4709-6283 OT Individual Time Calculation (min): 58 min    Short Term Goals: Week 1:  OT Short Term Goal 1 (Week 1): Patient will bathe upper body with min assist OT Short Term Goal 2 (Week 1): Patient will bathe lower body with mod assist OT Short Term Goal 3 (Week 1): Patient will complete toilet transfer with min assist OT Short Term Goal 4 (Week 1): Patient will don/doff pull over shirt with min assist OT Short Term Goal 5 (Week 1): Patient will don/doff elastic waist pants with mod assist  Skilled Therapeutic Interventions/Progress Updates:   Patient received sleeping in bed - husband sleeping in recliner.  Patient able to wake easier today than Wed.  Agreeable to get up and out of bed.  Patient indicated need to void when asked.  Patient had continent void on toilet - although had small incontinence prior.  Patient agreeable to to shower.  Patient transferring with mod assist stand pivot to shower seat.  Maintain eyes closed for aspects of shower - needing step by step cueing to remain on task.  Patient using her left hand to wash hair after assist to place hand on head.  Patient sat at sink to complete hygiene and dressing again with step by step cueing.   Patient fatigued at end of session requesting to go back to bed.  Patient's left foot caught under caster wheel of wheelchair despite cueing to pick up feet.  Patient reporting pain in foot.  Ice applied.  Nursing, MD and team informed of situation and will monitor.  Patient's mom at bedside to replace husband who was going to try to work today.  Encouraging husband to get some rest at home whenever possible.  Patient left in bed with bed alarm set, and call bell in reach.   Therapy Documentation Precautions:  Precautions Precautions: Fall Precaution Comments: L  hemi Restrictions Weight Bearing Restrictions: No  Pain:  Headache 4/10   Therapy/Group: Individual Therapy  [name] 09/23/2022, 1:02 PM

## 2022-09-23 NOTE — Progress Notes (Signed)
Patient ID: Kelly Stephenson, female   DOB: 09-24-1970, 52 y.o.   MRN: [id number] Met with the patient and mother to review current situation, secondary risks and rehab process, team conference and plan of care. Reviewed medications, dietary modifications and skin care. Handouts given for reference. Continue to follow along to address educational needs to facilitate preparation for discharge. Margarito Liner

## 2022-09-23 NOTE — Progress Notes (Signed)
Physical Therapy Session Note  Patient Details  Name: Kelly Stephenson MRN: [id number] Date of Birth: Jan 12, 1971  Today's Date: 09/23/2022 PT Individual Time: 7371-0626 + 9485-4627 PT Individual Time Calculation (min): 29 min  + 55 min  Short Term Goals: Week 1:  PT Short Term Goal 1 (Week 1): Pt will complete bed mobility with minA PT Short Term Goal 2 (Week 1): Pt will complete bed<>chair transfers with minA PT Short Term Goal 3 (Week 1): Pt will ambulate 46ft with minA and LRAD  Skilled Therapeutic Interventions/Progress Updates:      1st session: Pt supine in bed resting with her husband at bedside. Husband confirms patient had another poor nights rest, trying to get OOB x4 times and was successful one of the times. Discussed possibility of using a canopy bed for patient safety and husband seems to be in agreement.  Pt continuing to have swelling and tenderness of her L wrist - notified MD via secure chat with possible plan for XR.  Supine<>sitting EOB with modA for trunk support and for initiation. Pt slow to start, fatigued from poor nights rest.  Sitting balance fair without LOB while unsupported. Donned socks with totalA and pants with max A for time management. Squat<>pivot transfer to w/c with minA and then transported to day room rehab gym.  Assisted onto the [facility] via stand step transfer with minA with cues for setup and sequencing. Used seat belt while she sat on [employer] for safety. Completed x8 minutes at L 70 cm/sec resistance, cues for full ROM on L. Pt continues to appear in a "daze" although improved responses and clarity to orientation questions.  Returned to her room and assisted back to bed. Remained in bed with alarm on and all rails up, per husband request.   2nd session: Pt in bed to start with her daughter, mother, and co-worker at the bedside. Pt in agreement to therapy but needed some coaxing.   Supine<>sitting EOB with minA for trunk support. Donned  socks/tennis shoes with totalA for time management. Squat<>pivot transfer with minA to w/c. Transported to main rehab gym for time.  Worked on [employer] for LLE and standing balance, using 6inch stairs and 2 hand rails. Repeated step up/downs to work on motor planning, sequencing, and strength, and balance - pt needing heavy minA for stability and max cues for sequencing. Pt unable to recall sequencing despite cues. Seated rest breaks needed 2/2 fatigue. Also worked on [employer] w/ unilateral toe taps to 6inch step, CGA/minA overall with  BUE support to rails.   Continued NMR with BITS system - in standing and then in sitting due to fatigue. Completed trail making #1-#25 with min cues and min guard for balance - cues for awareness of L knee to promote extension. Instructed in Maze Test but patient unable to complete due to mental > physical fatigue.   Returned to her room. Assisted to bed with minA squat<>pivot transfer and assist for BLE management in bed. Concluded session in bed with family present, alarm on, and call bell within reach.       Therapy Documentation Precautions:  Precautions Precautions: Fall Precaution Comments: L hemi Restrictions Weight Bearing Restrictions: No General:    Therapy/Group: Individual Therapy  Alger Simons 09/23/2022, 7:40 AM

## 2022-09-23 NOTE — Progress Notes (Addendum)
  ADDENDUM: 24/Jan/2024 -  DC Behavior Plan. No more behaviors.    Behavioral Plan    Behavior to decrease/ eliminate:  -insomnia, day/night confusion -Giddy   Changes to environment:  -Lights on, blinds open during the day; off and closed at night  Interventions: -Canopy bed -nurse rounding at night -sleep/wake chart -Limit visitors (no more than 2 at a time) -Encourage out of bed in recliner during the day time, with family present and safety belt alarm on.    Attendees: Randel Books (CSW), Antony Salmon (OT), [place] (LPN), Weston Anna (SLP), Ginnie Smart (PT), Dorien Chihuahua (RN)  Therapy Documentation Precautions:  Precautions Precautions: Fall Precaution Comments: L hemi Restrictions Weight Bearing Restrictions: No General:    Kelly Stephenson Kelly Stephenson PT 09/23/2022, 11:58 AM

## 2022-09-23 NOTE — Progress Notes (Signed)
Occupational Therapy Session Note  Patient Details  Name: Kelly Stephenson MRN: [id number] Date of Birth: [birth year]  Today's Date: 09/23/2022 OT Individual Time: 8099-8338 OT Individual Time Calculation (min): 44 min    Short Term Goals: Week 1:  OT Short Term Goal 1 (Week 1): Patient will bathe upper body with min assist OT Short Term Goal 2 (Week 1): Patient will bathe lower body with mod assist OT Short Term Goal 3 (Week 1): Patient will complete toilet transfer with min assist OT Short Term Goal 4 (Week 1): Patient will don/doff pull over shirt with min assist OT Short Term Goal 5 (Week 1): Patient will don/doff elastic waist pants with mod assist  Skilled Therapeutic Interventions/Progress Updates:  Pt greeted supine in bed with pts mother present. Pt reports fatigue but agreeable to OT intervention.  Session focus on BADL reeducation, functional mobility, L sided attention, LUE AROM.   Pt completed supine>sit with MIN A to EOB. Pt completed squat pivot to w/c to R side with MINA. Pt continues to present as more of a BI pt noted to laugh inappropriately during session. Pt transported to gym for time mgmt. Utilized BITS to work on [employer] attention and AROM with pt instructed to reach to stimulus on screen with LUE to challenge visual attention and work on motor planning in [place]. Pt completed task from sitting with 44% accuracy ( poor motor control noted in LUE) with a 5.15 sec reaction time ( decreased response to stimulus on L side), 14 misses. Pt reports having a hard time looking to L. Pt does endorse blurry vision.   Utilized BITS to further work on [employer] with pt instructed to trace shapes on screen with LUE. Pt with poor motor control tracing all around shape but hardly ever on it. Pt also noted to continue tracing shape multiple times needing cues to terminate task. Pt was able to isolate index finger to complete task. Added 2 lb weight to LUE to provide NMR and sensory  reeducation during task with little improvement noted in motor control.  Had pt complete trail making task with RUE to further assess attention, problem solving and motor planning. Pt completed task in 6 mins and 41 secs with 28 interruptions with 19 errors. Pt very distracted during task by extraneous stimuli such as messing with her shirt or flipping her hair. Of note, there was another pt in the gym however pt didn't seem to be distracted necessarily by the pt did seem to be affected by any extra noise.     Pt reports need to void bladder, total A transport back to room with pt able to complete stand pivot to toilet to R side with use of grab bars with MODA. Pt required MOD A for 3/3 toileting tasks. Pt with continent urine void. Pt did report feeling dizzy on toilet with pt closing her eyes and noted to complete deep breathing. BP assessed: 114/70( 83) HR 81, nurse present.                  Ended session with pt supine in bed with all needs within reach and bed alarm activated.                    Therapy Documentation Precautions:  Precautions Precautions: Fall Precaution Comments: L hemi Restrictions Weight Bearing Restrictions: No  Pain: No pain    Therapy/Group: Individual Therapy  Precious Haws 09/23/2022, 3:56 PM

## 2022-09-23 NOTE — Progress Notes (Addendum)
PROGRESS NOTE   Subjective/Complaints: C/o headache C/o right wrist pain C/o insomnia Fatigued this AM  ROS: +headache, +insomnia, +fatigue  Objective:   No results found. Recent Labs    09/21/22 0556 09/22/22 1038  WBC 11.6* 10.7*  HGB 15.9* 15.0  HCT 47.3* 44.2  PLT 354 362   Recent Labs    09/22/22 1038 09/23/22 0603  NA 135 137  K 3.1* 3.9  CL 102 106  CO2 22 21*  GLUCOSE 113* 114*  BUN 24* 30*  CREATININE 1.03* 1.08*  CALCIUM 9.4 9.2    Intake/Output Summary (Last 24 hours) at 09/23/2022 1052 Last data filed at 09/22/2022 1528 Gross per 24 hour  Intake 177 ml  Output --  Net 177 ml        Physical Exam: Vital Signs Blood pressure 99/78, pulse 72, temperature 98.3 F (36.8 C), resp. rate 14, height 5\' 4"  (1.626 m), weight 87.8 kg, SpO2 97 %. Gen: no distress, normal appearing HEENT: oral mucosa pink and moist, NCAT Cardio: Reg rate Chest: normal effort, normal rate of breathing Abdominal:     General: There is no distension.     Palpations: Abdomen is soft.     Comments: Pt abd is soft, NT, protuberant but not distended per pt- hypoactive BS  Musculoskeletal:     Cervical back: Neck supple. No tenderness.     Comments: RUE 5/5 LUE- biceps 4-/5; triceps 3+/5; Grip 3+/5; FA 2/5 RLE- 5/5 LLE- HF 2/5; KE/KF 2/5; DF/PF 1/5 Right wrist is TTP Skin:    General: Skin is warm and dry.     Comments: Psoriatic patches left great toe, right lateral ankle and left elbow.   Neurological:     Mental Status: She is oriented to person, place, and time and easily aroused. She is lethargic.     Comments: Speech clear. Was able to state month, year, DOB but not day. Restless and internally distracted by pain and fatigue (lack of sleep but chronic). Left sided weakness with sensory deficits and intention. Left foot drop noted.  Sensation intact to light touch in all 4 extremities and face Hard to assess  inattention, etc since so sleepy/tired  Psychiatric:     Comments: Flat, sleepy due to pain meds per family, affect brightens when discussing tennis   Assessment/Plan: 1. Functional deficits which require 3+ hours per day of interdisciplinary therapy in a comprehensive inpatient rehab setting. Physiatrist is providing close team supervision and 24 hour management of active medical problems listed below. Physiatrist and rehab team continue to assess barriers to discharge/monitor patient progress toward functional and medical goals  Care Tool:  Bathing    Body parts bathed by patient: Chest, Abdomen, Face   Body parts bathed by helper: Right lower leg, Left lower leg, Right arm, Left arm, Front perineal area, Buttocks     Bathing assist Assist Level: Moderate Assistance - Patient 50 - 74%     Upper Body Dressing/Undressing Upper body dressing   What is the patient wearing?: Pull over shirt    Upper body assist Assist Level: Moderate Assistance - Patient 50 - 74%    Lower Body Dressing/Undressing Lower body dressing  What is the patient wearing?: Underwear/pull up, Pants     Lower body assist Assist for lower body dressing: Maximal Assistance - Patient 25 - 49%     Toileting Toileting    Toileting assist Assist for toileting: Moderate Assistance - Patient 50 - 74%     Transfers Chair/bed transfer  Transfers assist     Chair/bed transfer assist level: Moderate Assistance - Patient 50 - 74%     Locomotion Ambulation   Ambulation assist      Assist level: Moderate Assistance - Patient 50 - 74% Assistive device: Parallel bars Max distance: 8 ft   Walk 10 feet activity   Assist  Walk 10 feet activity did not occur: Safety/medical concerns        Walk 50 feet activity   Assist Walk 50 feet with 2 turns activity did not occur: Safety/medical concerns         Walk 150 feet activity   Assist Walk 150 feet activity did not occur:  Safety/medical concerns         Walk 10 feet on uneven surface  activity   Assist Walk 10 feet on uneven surfaces activity did not occur: Safety/medical concerns         Wheelchair     Assist Is the patient using a wheelchair?: Yes Type of Wheelchair: Manual    Wheelchair assist level: Dependent - Patient 0%      Wheelchair 50 feet with 2 turns activity    Assist        Assist Level: Dependent - Patient 0%   Wheelchair 150 feet activity     Assist      Assist Level: Dependent - Patient 0%   Blood pressure 99/78, pulse 72, temperature 98.3 F (36.8 C), resp. rate 14, height 5\' 4"  (1.626 m), weight 87.8 kg, SpO2 97 %.    Medical Problem List and Plan: 1. 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  Antithrombotics: -DVT/anticoagulation:  Pharmaceutical: Lovenox             -antiplatelet therapy: N/A 3. Pain Management:  On oxycodone and Fioricet prn.              --will start sleep chart. Add topamax             If topamax doesn't work, can try Elavil? 4. Mood/Behavior/Sleep: LCSW to follow for evaluation and support.  --Has a lot of stress, anxiety, insomnia-->hasn't wanted to get help per husband.  --chronic insomnia-->up/down multiple times during the night. --antipsychotic agents: N/A 5. Neuropsych/cognition: This patient is capable of making decisions on her own behalf. 6. Skin/Wound Care: Routine pressure relief measures.  7. Fluids/Electrolytes/Nutrition: Monitor I/O. Check CMET in am. 8. HTN: Monitor BP TID. Was not compliant with medications--does not want meds/doesn't like meds, so was noncompliant at home with meds prescribed.  -decrease amlodipine to 5mg  due to hypotension --On Norvasc, Cozaar, Hydralazine and Lopressor.             --SBP goal 130-150 range.   -at goal, supplement potassium 9.  Hyperlipidemia: LDL 206- on Crestor. 10. Hyponatremia: Question SIADH. Recheck sodium today.  11. Leucocytosis: Likely reactive--continue to trend. 10.3-->13.5-->10.9. Tmax-99.2  --Monitor for fevers and other signs of  infection.  12. Acute on chronic Headaches w/ nausea: Took 4 aleve daily.  --Will add Claritin and nose spray to help with congestion as could be exacerbating HA --Increase topamax to 100 mg HS. Add magnesium gluconate 250mg  HS 13. B12 deficiency: given IM B12 given 1/11, discussed that she may benefit from these monthly, or increasing consumption of meat.  14. Right wrist pain: XR ordered 15. AKI: start IVF at [facility] for one night  LOS: 3 days A FACE TO FACE EVALUATION WAS PERFORMED  Kelly Stephenson 09/23/2022, 10:52 AM

## 2022-09-23 NOTE — IPOC Note (Signed)
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 ([place]) Active Problems:   Thalamic hemorrhage ([place])     Functional Problem List: Nursing Bowel, Safety, Pain, Medication Management, Endurance  PT Balance, Endurance, Motor, Safety, Sensory, Skin Integrity, Perception  OT Balance, Sensory, Behavior, Cognition, Endurance, Motor, Pain, Perception, Safety, Vision  SLP Cognition  TR         Basic ADL's: OT Eating, Grooming, Bathing, Dressing, Toileting     Advanced  ADL's: OT       Transfers: PT Bed Mobility, Bed to Chair, Teacher, early years/pre, Tub/Shower     Locomotion: PT Ambulation, Stairs     Additional Impairments: OT Fuctional Use of Upper Extremity  SLP Social Cognition   Problem Solving, Memory, Awareness, Attention  TR      Anticipated Outcomes Item Anticipated Outcome  Self Feeding Mod I  Swallowing      Basic self-care  Mod I  Toileting  Mod I   Bathroom Transfers Min assist  Bowel/Bladder  manage bowel w mod I assist  Transfers  CGA with LRAD  Locomotion  CGA with LRAD  Communication     Cognition  sup A  Pain  < 4 with prns  Safety/Judgment  manage w cues   Therapy Plan: PT Intensity: Minimum of 1-2 x/day ,45 to 90 minutes PT Frequency: 5 out of 7 days PT Duration Estimated Length of Stay: 2 weeks OT Intensity: Minimum of 1-2 x/day, 45 to 90 minutes OT Frequency: 5 out of 7 days OT Duration/Estimated Length of Stay: 14-16 days SLP Intensity: Minumum of 1-2 x/day, 30 to 90 minutes SLP Frequency: 3 to 5 out of 7 days SLP Duration/Estimated Length of Stay: 14-16 days   Team Interventions: Nursing  Interventions Bowel Management, Disease Management/Prevention, Medication Management, Discharge Planning, Pain Management, Patient/Family Education  PT interventions Ambulation/gait training, Cognitive remediation/compensation, Discharge planning, DME/adaptive equipment instruction, Functional mobility training, Pain management, Psychosocial support, Splinting/orthotics, Therapeutic Activities, UE/LE Strength taining/ROM, Visual/perceptual remediation/compensation, Wheelchair propulsion/positioning, UE/LE Coordination activities, Therapeutic Exercise, Stair training, Skin care/wound management, Patient/family education, Neuromuscular re-education, Functional electrical stimulation, Disease management/prevention, Academic librarian, Training and development officer  OT Interventions Training and development officer, Neuromuscular re-education, Self Care/advanced ADL retraining, Therapeutic Exercise, Cognitive remediation/compensation, DME/adaptive equipment instruction, Pain management, UE/LE Strength taining/ROM, Functional electrical stimulation, Patient/family education, Splinting/orthotics, UE/LE Coordination activities, Discharge planning, Functional mobility training, Therapeutic Activities, Visual/perceptual remediation/compensation  SLP Interventions Cognitive remediation/compensation, Internal/external aids, Functional tasks, Patient/family education, Therapeutic Activities  TR Interventions    SW/CM Interventions Discharge Planning, Psychosocial Support, Patient/Family Education   Barriers to Discharge MD  Medical stability  Nursing Decreased caregiver support, Home environment access/layout 2 level main B+B 3 ste w spouse; 47 yr old son  PT Home environment Child psychotherapist, Insurance underwriter for SNF coverage, [employer]    OT      SLP      SW Insurance for SNF coverage     Team Discharge Planning: Destination: PT-Home ,OT- Home , SLP-Home Projected Follow-up: PT-Outpatient PT, 24 hour  supervision/assistance, OT-  Outpatient OT, SLP-Other (comment) (TBD) Projected Equipment Needs: PT-To be determined, OT- To be determined, SLP-None recommended by SLP Equipment Details: PT- , OT-tub/shower combination Patient/family involved in discharge planning: PT- Patient, Family member/caregiver,  OT-Patient, Family member/caregiver, SLP-Patient, Family member/caregiver  MD ELOS:  2 weeks Medical Rehab Prognosis:  Excellent Assessment: The patient has been admitted for CIR therapies with the diagnosis of ICH. The team will be addressing functional mobility, strength, stamina, balance, safety, adaptive techniques and equipment, self-care, bowel and bladder mgt, patient and caregiver education. Goals have been set at supervision. Anticipated discharge destination is home.        See Team Conference Notes for weekly updates to the plan of care

## 2022-09-23 NOTE — Progress Notes (Signed)
Patient in bed crying over IV that's infusing. She does not want it. IV site clean, dry and intact. Denies pain. No infiltration observed. Husband at bedside. RN disconnected IV. Patient became calmer and laid on her stomach. MD notified and said to just stop IV for tonight. Bun/cr made aware to MD.

## 2022-09-24 ENCOUNTER — Inpatient Hospital Stay (HOSPITAL_COMMUNITY): Payer: BC Managed Care – PPO

## 2022-09-24 LAB — URINALYSIS, ROUTINE W REFLEX MICROSCOPIC
Bilirubin Urine: NEGATIVE
Glucose, UA: NEGATIVE mg/dL
Hgb urine dipstick: NEGATIVE
Ketones, ur: 5 mg/dL — AB
Leukocytes,Ua: NEGATIVE
Nitrite: NEGATIVE
Protein, ur: NEGATIVE mg/dL
Specific Gravity, Urine: 1.018 (ref 1.005–1.030)
pH: 6 (ref 5.0–8.0)

## 2022-09-24 MED ORDER — SERTRALINE HCL 50 MG PO TABS
50.0000 mg | ORAL_TABLET | Freq: Every day | ORAL | Status: DC
  Administered 2022-09-24 – 2022-10-04 (×10): 50 mg via ORAL
  Filled 2022-09-24 (×13): qty 1

## 2022-09-24 MED ORDER — LORAZEPAM 0.5 MG PO TABS
0.5000 mg | ORAL_TABLET | Freq: Once | ORAL | Status: AC
  Administered 2022-09-24: 0.5 mg via ORAL
  Filled 2022-09-24: qty 1

## 2022-09-24 MED ORDER — CLONAZEPAM 0.5 MG PO TABS
0.5000 mg | ORAL_TABLET | Freq: Every day | ORAL | Status: DC
  Administered 2022-09-24 – 2022-09-28 (×5): 0.5 mg via ORAL
  Filled 2022-09-24 (×5): qty 1

## 2022-09-24 NOTE — Progress Notes (Signed)
RN went to check patient and she was c/o pain but unable to pin point keep saying pain  continously and started jumbling words and laughing continously and pounding her head on the pillow. Husband says this is new for her. MD notified. Agricultural consultant notified. Rapid RN called.

## 2022-09-24 NOTE — Plan of Care (Signed)
  Problem: RH SAFETY Goal: RH STG ADHERE TO SAFETY PRECAUTIONS W/ASSISTANCE/DEVICE Description: STG Adhere to Safety Precautions With cues Assistance/Device. Outcome:Progressing   Problem: RH PAIN MANAGEMENT Goal: RH STG PAIN MANAGED AT OR BELOW PT'S PAIN GOAL Description: < 4 with prns Outcome: Progressing ;    Problem: RH KNOWLEDGE DEFICIT Goal: RH STG INCREASE KNOWLEDGE OF HYPERTENSION Description: Patient and spouse will be able to manage HTN with medications and dietary modifications using educational resources independently Outcome: progressing

## 2022-09-24 NOTE — Progress Notes (Signed)
Physical Therapy Session Note  Patient Details  Name: Kelly Stephenson MRN: [id number] Date of Birth: 05-Sep-1971  Today's Date: 09/24/2022 PT Individual Time: 1300-      Short Term Goals: Week 1:  PT Short Term Goal 1 (Week 1): Pt will complete bed mobility with minA PT Short Term Goal 2 (Week 1): Pt will complete bed<>chair transfers with minA PT Short Term Goal 3 (Week 1): Pt will ambulate 27ft with minA and LRAD  Skilled Therapeutic Interventions/Progress Updates:   Chart reviewed. Pt recovering from morning neurologic event but being followed as Q4 neuro checks by rapid response team. PT entered room to find pt sleeping. PT discussed progress with caregiver in room. Caregiver reported pt had finally begun to rest after morning event and thought it best for pt to get rest.    Therapy Documentation Precautions:  Precautions Precautions: Fall Precaution Comments: L hemi Restrictions Weight Bearing Restrictions: No General: PT Amount of Missed Time (min): 45 Minutes PT Missed Treatment Reason: Patient ill (Comment) (pt recovering after morning neurologic event)   Therapy/Group: Individual Therapy  Marquette Old, PT, DPT 09/24/2022, 1:11 PM

## 2022-09-24 NOTE — Progress Notes (Signed)
   09/24/22 0823  Assess: MEWS Score  Temp 98.5 F (36.9 C)  BP (!) 151/84  MAP (mmHg) 101  Pulse Rate 95  Resp (!) 30  Level of Consciousness New agitation confusion  SpO2 100 %  O2 Device Room Air  Patient Activity (if Appropriate) In bed  Assess: if the MEWS score is Yellow or Red  Were vital signs taken at a resting state? No  Focused Assessment Change from prior assessment (see assessment flowsheet)  Does the patient meet 2 or more of the SIRS criteria? No  MEWS guidelines implemented *See Row Information* No, vital signs rechecked  Treat  Pain Score 10  Pain Type Acute pain  Pain Location Head  Pain Descriptors / Indicators Aching  Pain Frequency Constant  Pain Onset On-going  Pain Intervention(s) Medication (See eMAR)  Complains of Restless;Agitation;Anxiety  Interventions Medication (see MAR)  Neuro symptoms relieved by Anti-anxiety medication;Rest  Patients response to intervention Relief  Notify: Charge Nurse/RN  Name of Charge Nurse/RN Notified Josh RN  Date Charge Nurse/RN Notified 09/24/22  Time Charge Nurse/RN Notified 1761  Provider Notification  Provider Name/Title Kirsteins  Date Provider Notified 09/24/22  Time Provider Notified [phone]  Method of Notification Call  Notification Reason Other (Comment) (change in behavior)  Provider response See new orders  Date of Provider Response 09/24/22  Time of Provider Response 0825  Notify: Rapid Response  Name of Rapid Response RN Notified Hella RN  Date Rapid Response Notified 09/24/22  Time Rapid Response Notified 0823  Document  Patient Outcome Other (Comment) (patient stayed in unit ; patient calmed down, new orders noted; Ativan ,seroquel given and patient went to CT)  Progress note created (see row info) Yes  Assess: SIRS CRITERIA  SIRS Temperature  0  SIRS Pulse 1  SIRS Respirations  1  SIRS WBC 0  SIRS Score Sum  2

## 2022-09-24 NOTE — Significant Event (Signed)
Rapid Response Event Note   Reason for Call :  Behavior  Initial Focused Assessment:  Patient is lying in bed.  She is holding her eyes closed.  She is intermittently crying and laughing.  She says a few words but generally is speaking to staff.   She is breathing rapidly but in no distress.  Lung sounds clear.  BP 151/84  HR 95 RR 30 O2 sat 100% on RA Temp 98.5  She does calm some with soothing touch.     Interventions:   Plan of Care:  UA CT head (Ativan prior) Zoloft & Klonopin  RN/LPN to call if further assistance needed.   Event Summary:   MD Notified: Kirsteins Call Time: 0807 Arrival Time: 0810 End Time: 1660  Raliegh Ip, RN

## 2022-09-24 NOTE — Progress Notes (Signed)
PROGRESS NOTE   Subjective/Complaints:  Notified by LPN that pt has been breathing more rapidly , pt has been acting differently, has been cursing frequently   OT, LPN, husband at bedside   ROS: +headache, +insomnia, +fatigue  Objective:   DG Wrist 2 Views Left  Result Date: 09/23/2022 CLINICAL DATA:  Left-sided wrist pain EXAM: LEFT WRIST - 2 VIEW COMPARISON:  None Available. FINDINGS: There is no evidence of fracture or dislocation. There is no evidence of arthropathy or other focal bone abnormality. Soft tissues are unremarkable. If there is persistent pain or further concern for scaphoid injury recommend follow-up imaging in 7-10 days as these injuries can be acutely x-ray occult. IMPRESSION: No acute osseous abnormality Electronically Signed   By: Jill Side M.D.   On: 09/23/2022 17:44   Recent Labs    09/22/22 1038  WBC 10.7*  HGB 15.0  HCT 44.2  PLT 362    Recent Labs    09/22/22 1038 09/23/22 0603  NA 135 137  K 3.1* 3.9  CL 102 106  CO2 22 21*  GLUCOSE 113* 114*  BUN 24* 30*  CREATININE 1.03* 1.08*  CALCIUM 9.4 9.2     Intake/Output Summary (Last 24 hours) at 09/24/2022 6063 Last data filed at 09/23/2022 1500 Gross per 24 hour  Intake 240 ml  Output --  Net 240 ml         Physical Exam: Vital Signs Blood pressure (!) 151/84, pulse 95, temperature 98.5 F (36.9 C), resp. rate (!) 30, height 5\' 4"  (1.626 m), weight 87.8 kg, SpO2 100 %.  Laying in bed, smiling, intermittently closing and opening eyes.  Smile is symmetric Follows commands, perseverates on " Mrs Angello " RR fluctuates between normal unlabored breathing and more rapid  RR of 30 unlabored No increased WOB   Heart: Regular rate and rhythm no rubs murmurs or extra sounds Lungs: Clear to auscultation, breathing unlabored, no rales or wheezes Abdomen: Positive bowel sounds, soft nontender to palpation, nondistended Extremities: No  clubbing, cyanosis, or edema Skin: No evidence of breakdown, no evidence of rash Neurologic: ,MMT requires cueing  pt perseverates on left arm elevation  motor strength is 5/5,  RIght deltoid, bicep, tricep, grip, hip flexor, knee extensors, ankle dorsiflexor and plantar flexor LUE 3- at deltoid , bi, tri, grip, 3- Left HF, KE, 2- ADF  Sensory exam - pt does not attend      Assessment/Plan: 1. Functional deficits which require 3+ hours per day of interdisciplinary therapy in a comprehensive inpatient rehab setting. Physiatrist is providing close team supervision and 24 hour management of active medical problems listed below. Physiatrist and rehab team continue to assess barriers to discharge/monitor patient progress toward functional and medical goals  Care Tool:  Bathing    Body parts bathed by patient: Chest, Abdomen, Face   Body parts bathed by helper: Right lower leg, Left lower leg, Right arm, Left arm, Front perineal area, Buttocks     Bathing assist Assist Level: Moderate Assistance - Patient 50 - 74%     Upper Body Dressing/Undressing Upper body dressing   What is the patient wearing?: Pull over shirt    Upper  body assist Assist Level: Moderate Assistance - Patient 50 - 74%    Lower Body Dressing/Undressing Lower body dressing      What is the patient wearing?: Underwear/pull up, Pants     Lower body assist Assist for lower body dressing: Maximal Assistance - Patient 25 - 49%     Toileting Toileting    Toileting assist Assist for toileting: Moderate Assistance - Patient 50 - 74%     Transfers Chair/bed transfer  Transfers assist     Chair/bed transfer assist level: Minimal Assistance - Patient > 75% (squat pivot to R side)     Locomotion Ambulation   Ambulation assist      Assist level: Moderate Assistance - Patient 50 - 74% Assistive device: Parallel bars Max distance: 8 ft   Walk 10 feet activity   Assist  Walk 10 feet activity did  not occur: Safety/medical concerns        Walk 50 feet activity   Assist Walk 50 feet with 2 turns activity did not occur: Safety/medical concerns         Walk 150 feet activity   Assist Walk 150 feet activity did not occur: Safety/medical concerns         Walk 10 feet on uneven surface  activity   Assist Walk 10 feet on uneven surfaces activity did not occur: Safety/medical concerns         Wheelchair     Assist Is the patient using a wheelchair?: Yes Type of Wheelchair: Manual    Wheelchair assist level: Dependent - Patient 0%      Wheelchair 50 feet with 2 turns activity    Assist        Assist Level: Dependent - Patient 0%   Wheelchair 150 feet activity     Assist      Assist Level: Dependent - Patient 0%   Blood pressure (!) 151/84, pulse 95, temperature 98.5 F (36.9 C), resp. rate (!) 30, height 5\' 4"  (1.626 m), weight 87.8 kg, SpO2 100 %.    Medical Problem List and Plan: 1. 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  No new focal deficits to suggest extension of CVA, will check repeat CT but will need ativan to help pt to hold still during the exam  Start SSRI for PBA 2.  Antithrombotics: -DVT/anticoagulation:  Pharmaceutical: Lovenox             -antiplatelet therapy: N/A 3. Pain Management:  On oxycodone and Fioricet prn.              --will start sleep chart. Add topamax             If topamax doesn't work, can try Elavil? 4. Mood/Behavior/Sleep: LCSW to follow for evaluation and support.  --Has a lot of stress, anxiety, insomnia-->hasn't wanted to get help per husband.  --chronic insomnia-->up/down multiple times during the night. --antipsychotic agents: N/A 5. Neuropsych/cognition: This  patient is capable of making decisions on her own behalf. 6. Skin/Wound Care: Routine pressure relief measures.  7. Fluids/Electrolytes/Nutrition: Monitor I/O. Check CMET in am. 8. HTN: Monitor BP TID. Was not compliant with  medications--does not want meds/doesn't like meds, so was noncompliant at home with meds prescribed.  -decrease amlodipine to 5mg  due to hypotension --On Norvasc, Cozaar, Hydralazine and Lopressor.             --SBP goal 130-150 range.   -at goal, supplement potassium 9. Hyperlipidemia: LDL 206- on Crestor. 10. Hyponatremia: Question SIADH. Recheck sodium today.  11. Leucocytosis: Likely reactive--continue to trend. 10.3-->13.5-->10.9. Tmax-99.2  --Monitor for fevers and other signs of infection.  12. Acute on chronic Headaches w/ nausea: Took 4 aleve daily.  --Will add Claritin and nose spray to help with congestion as could be exacerbating HA --Increase topamax to 100 mg HS. Add magnesium gluconate 250mg  HS 13. B12 deficiency: given IM B12 given 1/11, discussed that she may benefit from these monthly, or increasing consumption of meat.  14. Right wrist pain: XR ordered 15. AKI: start IVF at [facility] for one night  LOS: 4 days A FACE TO FACE EVALUATION WAS PERFORMED  Charlett Blake 09/24/2022, 8:23 AM

## 2022-09-24 NOTE — Progress Notes (Signed)
Physical Therapy Session Note  Patient Details  Name: Kelly Stephenson MRN: [id number] Date of Birth: 1971-05-10  Today's Date: 09/24/2022 PT Individual Time: 1000-      Short Term Goals: Week 1:  PT Short Term Goal 1 (Week 1): Pt will complete bed mobility with minA PT Short Term Goal 2 (Week 1): Pt will complete bed<>chair transfers with minA PT Short Term Goal 3 (Week 1): Pt will ambulate 56ft with minA and LRAD  Skilled Therapeutic Interventions/Progress Updates:  Chart reviewed. PT received message from care team that pt had neurological event this AM and is being monitored for progress. PT will hold this session and return for PM session to assess if pt is appropriate for participation.     Therapy Documentation Precautions:  Precautions Precautions: Fall Precaution Comments: L hemi Restrictions Weight Bearing Restrictions: No General: PT Amount of Missed Time (min): 75 Minutes PT Missed Treatment Reason: Patient ill (Comment) (Pt had neurological event this AM and is being monitored for progress)     Therapy/Group: Individual Therapy  Marquette Old, PT, DPT 09/24/2022, 10:09 AM

## 2022-09-24 NOTE — Progress Notes (Signed)
Occupational Therapy Session Note  Patient Details  Name: Kelly Stephenson MRN: [id number] Date of Birth: October 18, 1970  Today's Date: 09/24/2022 OT Individual Time: 0800-0830 OT Individual Time Calculation (min): 30 min    Short Term Goals: Week 1:  OT Short Term Goal 1 (Week 1): Patient will bathe upper body with min assist OT Short Term Goal 2 (Week 1): Patient will bathe lower body with mod assist OT Short Term Goal 3 (Week 1): Patient will complete toilet transfer with min assist OT Short Term Goal 4 (Week 1): Patient will don/doff pull over shirt with min assist OT Short Term Goal 5 (Week 1): Patient will don/doff elastic waist pants with mod assist  Skilled Therapeutic Interventions/Progress Updates:    Entered room to patient screaming and cursing repetitively.  Kicking legs, thrashing in bed.  Unable to stop with cueing, touch, calming voice.  Nursing aware - vitals as indicated.  Husband at bedside.  Per husband - patient indicated seeing colors, calling herself [name].  Patient unable to indicate orientation to self/ place.   After several minutes, patient able to stop kicking, thrashing, screaming, crying - still breathing heavily, but calmer.  MD in to assess.  Patient still unable to state where she was - grabbing head - unable to indicate headache pain.  (Patient has been oriented and able to score pain)  Patient finally able to rest.  Spoke with nursing team with husband present that patient has not been sleeping or eating.  Still awaiting enclosure bed.  Husband at bedside.  Discussed cancelling am therapies in hopes of patient resting.    Therapy Documentation Precautions:  Precautions Precautions: Fall Precaution Comments: L hemi Restrictions Weight Bearing Restrictions: No General: General OT Amount of Missed Time: 30 Minutes Vital Signs: Therapy Vitals Temp: 98.5 F (36.9 C) Pulse Rate: 95 Resp: (!) 30 BP: (!) 151/84 Patient Position (if appropriate):  Lying Oxygen Therapy SpO2: 100 % O2 Device: Room Air Pain: Pain Assessment Pain Score: 10-Worst pain ever Pain Type: Acute pain Pain Location: Head Pain Descriptors / Indicators: Aching Pain Frequency: Constant Pain Onset: On-going Pain Intervention(s): Medication (See eMAR)   [name] 09/24/2022, 8:37 AM

## 2022-09-24 NOTE — Progress Notes (Signed)
Patient has calmed down and was able to take medications. CT called and said ok to give Ativan scheduled x1 for test

## 2022-09-24 NOTE — Progress Notes (Signed)
Patient is calm , resting at the moment. Mother in room with patient.

## 2022-09-24 NOTE — Progress Notes (Signed)
Speech Language Pathology Daily Session Note  Patient Details  Name: Kelly Stephenson MRN: [id number] Date of Birth: [birth year]  Today's Date: 09/24/2022 SLP Individual Time: 0220-0236 SLP Individual Time Calculation (min): 16 min  Short Term Goals: Week 1: SLP Short Term Goal 1 (Week 1): Patient will participate in further cognitive-linguistic evaluation to full completion for ongoing goal development SLP Short Term Goal 2 (Week 1): Patient will orient x4 with min A for use of external aids SLP Short Term Goal 3 (Week 1): Patient will utilize memory compensations to recall biographical and/or novel information with mod A verbal/visual cues SLP Short Term Goal 4 (Week 1): Patient will sustain attention to functional tasks for 3 minute intervals with mod A verbal redirection cues SLP Short Term Goal 5 (Week 1): Patient will complete mildlt complex problem solving with mod A verbal/visual cues to achieve 75% accuracy  Skilled Therapeutic Interventions: skilled intervention focused on cognition. Patients mother present in room. Pt answered orientation questions with max A and increased time needed to answer questions. Pt was given medication earlier today due to pseudobulbar affect with outbursts of laughing and inappropriate language. She sustained attention 1-2 min in conversation with mod cues for redirection. Session ended after 15 min due to pt falling asleep.     Pain Pain Assessment Pain Scale: Faces Faces Pain Scale: No hurt  Therapy/Group: Individual Therapy  Darrol Poke Lyndy Russman 09/24/2022, 2:37 PM

## 2022-09-25 LAB — URINE CULTURE

## 2022-09-25 NOTE — Progress Notes (Signed)
PROGRESS NOTE   Subjective/Complaints:  Pt's daughter said she JUST went to sleep- hasn't been c/o HA this AM.  But notes had laughing and crying inappropriately yesterday and banging her head again bed- sounds like  pseudobulbar behavior.   Daughter said was better so far this AM.  Slept well with Klonopin last night per nurse Pt said slept "OK" to daughter, but husband said he slept the first night all night last night and she didn't wake him up.     ROS: limited by sleepiness Objective:   CT HEAD WO CONTRAST ( )  Result Date: 09/24/2022 CLINICAL DATA:  52 year old female who presented with right basal ganglia hemorrhage on January 5th. Subsequent encounter. EXAM: CT HEAD WITHOUT CONTRAST TECHNIQUE: Contiguous axial images were obtained from the base of the skull through the vertex without intravenous contrast. RADIATION DOSE REDUCTION: This exam was performed according to the departmental dose-optimization program which includes automated exposure control, adjustment of the mA and/or kV according to patient size and/or use of iterative reconstruction technique. COMPARISON:  09/19/2022 and earlier. FINDINGS: Brain: Hyperdense right lateral thalamic hemorrhage is stable and size and has begun to fade. More indistinct appearance of blood products now. Continued surrounding edema. Stable mild regional mass effect. Intraventricular blood has largely resolved. No ventriculomegaly. Basilar cisterns remain patent. Stable gray-white differentiation elsewhere. No new No acute intracranial abnormality. Vascular: Calcified atherosclerosis at the skull base. No suspicious intracranial vascular hyperdensity. Skull: No acute osseous abnormality identified. Sinuses/Orbits: Visualized paranasal sinuses and mastoids are stable and well aerated. Other: Visualized orbits and scalp soft tissues are within normal limits. IMPRESSION: 1. Resolved  intraventricular hemorrhage and fading of the right lateral thalamic blood products since 09/19/2022. Stable mild regional edema and mass effect. 2. No new intracranial abnormality. Electronically Signed   By: Odessa Fleming M.D.   On: 09/24/2022 10:21   No results for input(s): "WBC", "HGB", "HCT", "PLT" in the last 72 hours. Recent Labs    09/23/22 0603  NA 137  K 3.9  CL 106  CO2 21*  GLUCOSE 114*  BUN 30*  CREATININE 1.08*  CALCIUM 9.2    Intake/Output Summary (Last 24 hours) at 09/25/2022 1731 Last data filed at 09/25/2022 1253 Gross per 24 hour  Intake 476 ml  Output --  Net 476 ml        Physical Exam: Vital Signs Blood pressure (!) 105/55, pulse 71, temperature 98.2 F (36.8 C), resp. rate 16, height 5\' 4"  (1.626 m), weight 87.8 kg, SpO2 97 %.    General: asleep on side; daughter at bedside; NAD HENT:  oropharynx a little dry CV: regular rate; no JVD Pulmonary: CTA B/L; no W/R/R- good air movement GI: soft, NT, ND, (+)BS Psychiatric: sleeping Neurological: asleep- woke briefly to allow me to listen to her Skin: No evidence of breakdown, no evidence of rash Neurologic: ,MMT requires cueing  pt perseverates on left arm elevation  motor strength is 5/5,  RIght deltoid, bicep, tricep, grip, hip flexor, knee extensors, ankle dorsiflexor and plantar flexor LUE 3- at deltoid , bi, tri, grip, 3- Left HF, KE, 2- ADF  Sensory exam - pt does not attend  Assessment/Plan: 1. Functional deficits which require 3+ hours per day of interdisciplinary therapy in a comprehensive inpatient rehab setting. Physiatrist is providing close team supervision and 24 hour management of active medical problems listed below. Physiatrist and rehab team continue to assess barriers to discharge/monitor patient progress toward functional and medical goals  Care Tool:  Bathing    Body parts bathed by patient: Chest, Abdomen, Face   Body parts bathed by helper: Right lower leg, Left lower leg,  Right arm, Left arm, Front perineal area, Buttocks     Bathing assist Assist Level: Moderate Assistance - Patient 50 - 74%     Upper Body Dressing/Undressing Upper body dressing   What is the patient wearing?: Pull over shirt    Upper body assist Assist Level: Moderate Assistance - Patient 50 - 74%    Lower Body Dressing/Undressing Lower body dressing      What is the patient wearing?: Underwear/pull up, Pants     Lower body assist Assist for lower body dressing: Maximal Assistance - Patient 25 - 49%     Toileting Toileting    Toileting assist Assist for toileting: Moderate Assistance - Patient 50 - 74%     Transfers Chair/bed transfer  Transfers assist     Chair/bed transfer assist level: Minimal Assistance - Patient > 75% (squat pivot to R side)     Locomotion Ambulation   Ambulation assist      Assist level: Moderate Assistance - Patient 50 - 74% Assistive device: Parallel bars Max distance: 8 ft   Walk 10 feet activity   Assist  Walk 10 feet activity did not occur: Safety/medical concerns        Walk 50 feet activity   Assist Walk 50 feet with 2 turns activity did not occur: Safety/medical concerns         Walk 150 feet activity   Assist Walk 150 feet activity did not occur: Safety/medical concerns         Walk 10 feet on uneven surface  activity   Assist Walk 10 feet on uneven surfaces activity did not occur: Safety/medical concerns         Wheelchair     Assist Is the patient using a wheelchair?: Yes Type of Wheelchair: Manual    Wheelchair assist level: Dependent - Patient 0%      Wheelchair 50 feet with 2 turns activity    Assist        Assist Level: Dependent - Patient 0%   Wheelchair 150 feet activity     Assist      Assist Level: Dependent - Patient 0%   Blood pressure (!) 105/55, pulse 71, temperature 98.2 F (36.8 C), resp. rate 16, height 5\' 4"  (1.626 m), weight 87.8 kg, SpO2 97  %.    Medical Problem List and Plan: 1. 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  No new focal deficits to suggest extension of CVA, will check repeat CT but will need ativan to help pt to hold still during the exam  Start SSRI for PBA  1/14-U/A (-) PBA appears to be what was going on- doing better today  per daughter Con't CIR- PT, OT and SLP  2.  Antithrombotics: -DVT/anticoagulation:  Pharmaceutical: Lovenox             -antiplatelet therapy: N/A 3. Pain Management:  On oxycodone and Fioricet prn.              --will start sleep chart. Add topamax             If topamax doesn't work, can try Elavil? 4. Mood/Behavior/Sleep: LCSW to follow for evaluation and support.  --Has a lot of stress, anxiety, insomnia-->hasn't wanted to get help per husband.  --chronic insomnia-->up/down multiple times during the night. --antipsychotic agents: N/A 5. Neuropsych/cognition: This patient is capable of making decisions on her own behalf. 6. Skin/Wound Care: Routine pressure relief measures.  7. Fluids/Electrolytes/Nutrition: Monitor I/O. Check CMET in am. 8. HTN: Monitor BP TID. Was not compliant with medications--does not want meds/doesn't like meds, so was noncompliant at home with meds prescribed.  -decrease amlodipine to 5mg  due to hypotension --On Norvasc, Cozaar, Hydralazine and Lopressor.             --SBP goal 130-150 range.   -at goal, supplement potassium  1/14- Nursing asking if OK to give BP meds since BP 683 systolic- explained would like them all given-  9. Hyperlipidemia: LDL 206- on Crestor. 10. Hyponatremia: Question SIADH. Recheck sodium today.  11. Leucocytosis: Likely reactive--continue to trend. 10.3-->13.5-->10.9. Tmax-99.2  --Monitor for fevers  and other signs of infection.  12. Acute on chronic Headaches w/ nausea: Took 4 aleve daily.  --Will add Claritin and nose spray to help with congestion as could be exacerbating HA --Increase topamax to 100 mg HS. Add magnesium gluconate 250mg  HS 13. B12 deficiency: given IM B12 given 1/11, discussed that she may benefit from these monthly, or increasing consumption of meat.  14. Right wrist pain: XR ordered 15. AKI: start IVF at [facility] for one night  1/14- labs in AM    LOS: 5 days A FACE TO FACE EVALUATION WAS PERFORMED  Deidrick Rainey 09/25/2022, 5:31 PM

## 2022-09-25 NOTE — Progress Notes (Signed)
RN was  called to room by family member re: patient needs help in bathroom. When RN arrived patient was taking a shower. RN asked family member if they called for help to go to bathroom. Per daughter she took patient to bathroom and needing help to transfer patient back to chair .  NT and RN helped patient finish her shower, transferred back to chair. Daughter educated regarding transfers and helping patient to the bathroom.

## 2022-09-25 NOTE — Progress Notes (Signed)
Patient is calm today ; no complaints of headache as of this time; daughter in room.

## 2022-09-26 DIAGNOSIS — R7989 Other specified abnormal findings of blood chemistry: Secondary | ICD-10-CM

## 2022-09-26 DIAGNOSIS — G441 Vascular headache, not elsewhere classified: Secondary | ICD-10-CM

## 2022-09-26 DIAGNOSIS — I10 Essential (primary) hypertension: Secondary | ICD-10-CM

## 2022-09-26 LAB — CBC
HCT: 41.6 % (ref 36.0–46.0)
Hemoglobin: 13.8 g/dL (ref 12.0–15.0)
MCH: 28 pg (ref 26.0–34.0)
MCHC: 33.2 g/dL (ref 30.0–36.0)
MCV: 84.4 fL (ref 80.0–100.0)
Platelets: 343 10*3/uL (ref 150–400)
RBC: 4.93 MIL/uL (ref 3.87–5.11)
RDW: 12.6 % (ref 11.5–15.5)
WBC: 11.8 10*3/uL — ABNORMAL HIGH (ref 4.0–10.5)
nRBC: 0 % (ref 0.0–0.2)

## 2022-09-26 LAB — BASIC METABOLIC PANEL
Anion gap: 8 (ref 5–15)
BUN: 26 mg/dL — ABNORMAL HIGH (ref 6–20)
CO2: 21 mmol/L — ABNORMAL LOW (ref 22–32)
Calcium: 9 mg/dL (ref 8.9–10.3)
Chloride: 108 mmol/L (ref 98–111)
Creatinine, Ser: 0.99 mg/dL (ref 0.44–1.00)
GFR, Estimated: >60 mL/min (ref >60)
Glucose, Bld: 107 mg/dL — ABNORMAL HIGH (ref 70–99)
Potassium: 3.7 mmol/L (ref 3.5–5.1)
Sodium: 137 mmol/L (ref 135–145)

## 2022-09-26 MED ORDER — DEXTROMETHORPHAN-QUINIDINE 20-10 MG PO CAPS
1.0000 | ORAL_CAPSULE | Freq: Every day | ORAL | Status: DC
  Administered 2022-09-26 – 2022-09-29 (×4): 1 via ORAL
  Filled 2022-09-26 (×4): qty 1

## 2022-09-26 MED ORDER — FA-PYRIDOXINE-CYANOCOBALAMIN 2.5-25-2 MG PO TABS
1.0000 | ORAL_TABLET | Freq: Every day | ORAL | Status: DC
  Administered 2022-09-26 – 2022-10-13 (×17): 1 via ORAL
  Filled 2022-09-26 (×18): qty 1

## 2022-09-26 NOTE — Progress Notes (Signed)
Speech Language Pathology Daily Session Note  Patient Details  Name: Kelly Stephenson MRN: [id number] Date of Birth: 11-24-1970  Today's Date: 09/26/2022 SLP Individual Time: 1100-1158 SLP Individual Time Calculation (min): 58 min  Short Term Goals: Week 1: SLP Short Term Goal 1 (Week 1): Patient will participate in further cognitive-linguistic evaluation to full completion for ongoing goal development SLP Short Term Goal 2 (Week 1): Patient will orient x4 with min A for use of external aids SLP Short Term Goal 3 (Week 1): Patient will utilize memory compensations to recall biographical and/or novel information with mod A verbal/visual cues SLP Short Term Goal 4 (Week 1): Patient will sustain attention to functional tasks for 3 minute intervals with mod A verbal redirection cues SLP Short Term Goal 5 (Week 1): Patient will complete mildlt complex problem solving with mod A verbal/visual cues to achieve 75% accuracy  Skilled Therapeutic Interventions: Skilled treatment session focused on cognitive goals. Upon arrival, patient was awake in bed but reported pain in LLE. Nursing made aware and administered medications. Patient agreeable to treatment session and repositioned in bed to maximize arousal and attention. With supervision level verbal cues, patient was able to utilize external aids for orientation X 4. SLP also facilitated session by providing extra time and Mod verbal and visual cues for problem solving and working memory during a basic money management task. Min verbal cues were also needed for sustained attention to task for ~2-3 minute intervals. Patient left upright in bed with alarm on and all needs within reach. Continue with current plan of care.      Pain Pain Assessment Pain Scale: 0-10 Pain Score: 0-No pain Faces Pain Scale: Hurts a little bit Pain Type: Acute pain Pain Location: Hip Pain Orientation: Left Pain Radiating Towards: leg Pain Descriptors / Indicators:  Aching Pain Frequency: Intermittent Pain Onset: On-going Patients Stated Pain Goal: 4 Pain Intervention(s): Medication (See eMAR)  Therapy/Group: Individual Therapy  Maizie Garno, [place] 09/26/2022, 3:13 PM

## 2022-09-26 NOTE — Progress Notes (Signed)
+/-  sleep.  At [facility] Requesting senna s to be increased. At 0230, large, contient BM. Complained of hurting when having BM. Patient reports she doesn't need to wear left boot. Family at bedside. PRN trazodone 25mg 's given at 2120. Kelly Stephenson A

## 2022-09-26 NOTE — Progress Notes (Signed)
PRN miralax given this AM at 0620. Kelly Stephenson

## 2022-09-26 NOTE — Progress Notes (Signed)
PROGRESS NOTE   Subjective/Complaints:  Husband reports reasonable night. Has been able to sleep better the last couple evenings. Headaches are better controlled. A little slow to awaken this morning. Pt denied any problems  ROS: Limited due to cognitive/behavioral     ROS: limited by sleepiness Objective:   No results found. Recent Labs    09/26/22 0633  WBC 11.8*  HGB 13.8  HCT 41.6  PLT 343   Recent Labs    09/26/22 0633  NA 137  K 3.7  CL 108  CO2 21*  GLUCOSE 107*  BUN 26*  CREATININE 0.99  CALCIUM 9.0    Intake/Output Summary (Last 24 hours) at 09/26/2022 1027 Last data filed at 09/25/2022 2300 Gross per 24 hour  Intake 716 ml  Output --  Net 716 ml        Physical Exam: Vital Signs Blood pressure 135/65, pulse 84, temperature 98.1 F (36.7 C), temperature source Oral, resp. rate 16, height 5\' 4"  (1.626 m), weight 87.8 kg, SpO2 98 %.    Constitutional: No distress . Vital signs reviewed. HEENT: NCAT, EOMI, oral membranes moist Neck: supple Cardiovascular: RRR without murmur. No JVD    Respiratory/Chest: CTA Bilaterally without wheezes or rales. Normal effort    GI/Abdomen: BS +, non-tender, non-distended Ext: no clubbing, cyanosis, or edema Psych: flat, does awaken briefly and is cooperative. Skin: No evidence of breakdown, no evidence of rash Neurologic: slow to arouse. Follows commands.  Right sided motor strength is grossly 5/5,    LUE 3- at deltoid , bi, tri, grip, 3- Left HF, KE, 2- ADF  Sensory exam - pt does not attend consistently to participate     Assessment/Plan: 1. Functional deficits which require 3+ hours per day of interdisciplinary therapy in a comprehensive inpatient rehab setting. Physiatrist is providing close team supervision and 24 hour management of active medical problems listed below. Physiatrist and rehab team continue to assess barriers to discharge/monitor  patient progress toward functional and medical goals  Care Tool:  Bathing    Body parts bathed by patient: Chest, Abdomen, Face   Body parts bathed by helper: Right lower leg, Left lower leg, Right arm, Left arm, Front perineal area, Buttocks     Bathing assist Assist Level: Moderate Assistance - Patient 50 - 74%     Upper Body Dressing/Undressing Upper body dressing   What is the patient wearing?: Pull over shirt    Upper body assist Assist Level: Moderate Assistance - Patient 50 - 74%    Lower Body Dressing/Undressing Lower body dressing      What is the patient wearing?: Underwear/pull up, Pants     Lower body assist Assist for lower body dressing: Maximal Assistance - Patient 25 - 49%     Toileting Toileting    Toileting assist Assist for toileting: Moderate Assistance - Patient 50 - 74%     Transfers Chair/bed transfer  Transfers assist     Chair/bed transfer assist level: Minimal Assistance - Patient > 75% (squat pivot to R side)     Locomotion Ambulation   Ambulation assist      Assist level: Moderate Assistance - Patient 50 - 74%  Assistive device: Parallel bars Max distance: 8 ft   Walk 10 feet activity   Assist  Walk 10 feet activity did not occur: Safety/medical concerns        Walk 50 feet activity   Assist Walk 50 feet with 2 turns activity did not occur: Safety/medical concerns         Walk 150 feet activity   Assist Walk 150 feet activity did not occur: Safety/medical concerns         Walk 10 feet on uneven surface  activity   Assist Walk 10 feet on uneven surfaces activity did not occur: Safety/medical concerns         Wheelchair     Assist Is the patient using a wheelchair?: Yes Type of Wheelchair: Manual    Wheelchair assist level: Dependent - Patient 0%      Wheelchair 50 feet with 2 turns activity    Assist        Assist Level: Dependent - Patient 0%   Wheelchair 150 feet activity      Assist      Assist Level: Dependent - Patient 0%   Blood pressure 135/65, pulse 84, temperature 98.1 F (36.7 C), temperature source Oral, resp. rate 16, height 5\' 4"  (1.626 m), weight 87.8 kg, SpO2 98 %.    Medical Problem List and Plan: 1. 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  Antithrombotics: -DVT/anticoagulation:  Pharmaceutical: Lovenox             -antiplatelet therapy: N/A 3. Pain Management:  On oxycodone and Fioricet prn.              --will start sleep chart. Added topamax--need to watch for sedation topamax as well              4. Mood/Behavior/Sleep: LCSW to follow for evaluation and support.  --Has a lot of stress, anxiety, insomnia-->hasn't wanted to get help per husband.  --chronic insomnia-->up/down multiple times during the night. --antipsychotic agents: N/A -consider stimulant trial for day time arousal 5. Neuropsych/cognition: This patient is capable of making decisions on her own behalf. 6. Skin/Wound Care: Routine pressure relief measures.  7. Fluids/Electrolytes/Nutrition: encourage PO -BUN still elevated 26 but trending down from 1/12 lab (30). 8. HTN: Monitor BP TID. Was not compliant with medications--does not want meds/doesn't like meds, so was noncompliant at home with meds prescribed.  -decrease amlodipine to 5mg  due to hypotension --On Norvasc, Cozaar, Hydralazine and Lopressor.             --SBP goal 130-150 range.   -at goal, supplement potassium  1/15 bp controlled 9. Hyperlipidemia: LDL 206- on Crestor. 10. Hyponatremia: Question SIADH. Recheck sodium today.  11. Leucocytosis: Likely reactive--still sl elevated  -afebrile --No signs of infection.  12. Acute on chronic Headaches w/ nausea: Took 4  aleve daily.  --Will add Claritin and nose spray to help with congestion as could be exacerbating HA --Increased topamax to 100 mg HS. Added magnesium gluconate 250mg  HS 13. B12 deficiency: given IM B12 given 1/11, discussed that she may benefit from these monthly, or increasing consumption of meat.  14. Right wrist pain: XR ordered 15. AKI: start IVF at [facility] for one night  1/15 labs improved  -encourage PO   LOS: 6 days A FACE TO FACE EVALUATION WAS PERFORMED  Meredith Staggers 09/26/2022, 10:27 AM

## 2022-09-26 NOTE — Progress Notes (Signed)
Physical Therapy Session Note  Patient Details  Name: Kelly Stephenson MRN: [id number] Date of Birth: [birth year]  Today's Date: 09/26/2022 PT Individual Time: 1300-1415 PT Individual Time Calculation (min): 75 min   Short Term Goals: Week 1:  PT Short Term Goal 1 (Week 1): Pt will complete bed mobility with minA PT Short Term Goal 2 (Week 1): Pt will complete bed<>chair transfers with minA PT Short Term Goal 3 (Week 1): Pt will ambulate 59ft with minA and LRAD  Skilled Therapeutic Interventions/Progress Updates:      Pt supine in bed to start with her mother at the bedside. Pt in agreement to therapy session but needs more than a reasonable amount of time to wake and initiate mobility. Needed step-by-step cues for completing bed mobility as she had difficulty problem solving from R sidelying to sitting EOB on her L. MinA needed with max cues.   Donned tennis shoes and sweater with totalA for time management. Stand step transfer with minA and no AD from EOB to w/c, towards her R side.   Wheeled in bathroom to encourage toileting. Stand<>pivot with minA using grab bars and assist needed for managing pants in standing with fair standing balance, reliant on RUE support to grab bar. Pt continent of bladder - charted in flowsheets. Returned to w/c with minA and stand pivot transfer than wheeled sinkside where she completed hand hygiene with assist from her mother.   Transported to main rehab gym to focus remainder of session on gait training. Retrieved RW to facilitate gait. Retrieved AFO for her L foot (PLS) with medial strut. Sit<>stand to RW with CGA and ambulated ~53ft with minA and RW using the AFO. Pt c/o discomfort in AFO from tight fit and from medial strut rubbing. Unable to locate AFO with lateral strut that would fit her (small shoe size).   Assessed heel cord tightness - limited ankle DF to 90deg with knee extended, only slightly more DF with knee flexed. Would benefit from cord  stretching.   Ace wrapped L ankle for DF assist and shoe cover for toe cap. She ambulated 78ft + 66ft with minA and RW - L knee stays flexed in stance, decreased stance time on L, step-to pattern primarily but worked on reciprocal stepping. Able to grip and manage RW without assist.   Returned to bed with modA for BLE and trunk support. All needs met with alarm on at end of session.    Therapy Documentation Precautions:  Precautions Precautions: Fall Precaution Comments: L hemi Restrictions Weight Bearing Restrictions: No General:    Therapy/Group: Individual Therapy  Takeya Marquis P Ianna Salmela PT 09/26/2022, 7:41 AM

## 2022-09-26 NOTE — Progress Notes (Signed)
Occupational Therapy Session Note  Patient Details  Name: Kelly Stephenson MRN: [id number] Date of Birth: [birth year]  Today's Date: 09/26/2022 OT Individual Time: 8127-5170 OT Individual Time Calculation (min): 55 min    Short Term Goals: Week 1:  OT Short Term Goal 1 (Week 1): Patient will bathe upper body with min assist OT Short Term Goal 2 (Week 1): Patient will bathe lower body with mod assist OT Short Term Goal 3 (Week 1): Patient will complete toilet transfer with min assist OT Short Term Goal 4 (Week 1): Patient will don/doff pull over shirt with min assist OT Short Term Goal 5 (Week 1): Patient will don/doff elastic waist pants with mod assist  Skilled Therapeutic Interventions/Progress Updates:    Patient sleeping upon arrival.  Awoke fairly easily and agreeable to getting up out of bed.  Reports - "I got in so much trouble this weekend." Patient and husband report - too many people in the room, up without help from staff for toileting and shower.   Patient indicates she got her hair cut and colored this weekend. No recollection of events of Saturday am.   Patient denied need for bathroom - "I have gone like 10,000 times!" Affect brighter - mood less labile.   Patient sat in wheelchair at sink to complete hygiene, then trasnported to gym to address NMR LUE/LLE.  Weight shifting to left then weight bearing through LUE.  Patient wincing when contact made with left wrist - reviewed radiology - no findings.  Patient expressing feeling like wrist is bruised - not sharp pain.   Modified plantigrade position to address loading active LUE and LLE.   Patient with limited toleance to standing activity.  Difficulty accepting wright on LLE.  Transported back to room and back to bed,  Bed alarm set and call bell, personal items in reach.    Therapy Documentation Precautions:  Precautions Precautions: Fall Precaution Comments: L hemi Restrictions Weight Bearing Restrictions: No    Pain: Pain Assessment Pain Scale: 0-10 Pain Score: 0-No pain    Therapy/Group: Individual Therapy  [name] 09/26/2022, 9:59 AM

## 2022-09-27 MED ORDER — LORATADINE 10 MG PO TABS: 10.0000 mg | ORAL_TABLET | Freq: Every day | ORAL | Status: AC

## 2022-09-27 MED ORDER — AMLODIPINE BESYLATE 2.5 MG PO TABS
2.5000 mg | ORAL_TABLET | Freq: Every day | ORAL | Status: DC
  Administered 2022-09-28: 2.5 mg via ORAL
  Filled 2022-09-27: qty 1

## 2022-09-27 MED ORDER — ACETAMINOPHEN 325 MG PO TABS: 650.0000 mg | ORAL_TABLET | ORAL | Status: AC | PRN

## 2022-09-27 NOTE — Progress Notes (Signed)
Per conversation with DR Ranell Patrick, Patient was given prn trazodone last night and slept through the night. No current need for the enclosure bed that was ordered, order cancelled.

## 2022-09-27 NOTE — Progress Notes (Signed)
Occupational Therapy Session Note  Patient Details  Name: Kelly Stephenson MRN: [id number] Date of Birth: [birth year]  Today's Date: 09/27/2022 OT Individual Time: 7169-6789 and 3810-1751 OT Individual Time Calculation (min): 75 min and 73 min   Short Term Goals: Week 1:  OT Short Term Goal 1 (Week 1): Patient will bathe upper body with min assist OT Short Term Goal 2 (Week 1): Patient will bathe lower body with mod assist OT Short Term Goal 3 (Week 1): Patient will complete toilet transfer with min assist OT Short Term Goal 4 (Week 1): Patient will don/doff pull over shirt with min assist OT Short Term Goal 5 (Week 1): Patient will don/doff elastic waist pants with mod assist  Skilled Therapeutic Interventions/Progress Updates:    AM Session:  Patient received sleeping soundly.  Able to wake her with increased time.  Husband also asleep in room.  Patient agreeable to getting up out of bed and having breakfast.  Denied need to use the bathroom.  Patient able to transition from sidelying to sitting with verbal cueing and increased time.  Transferred to wheelchair with contact guard.  Patient sat at table and fed herself breakfast.  With cueing able to use LUE to assist with bimanual cutting.  Patient showered with min assist, using grab bar to transition from sit to stand.  Patient transported to sink to complete hygiene and dressing.  Patient able to use her left hand functionally as assist with cueing.  Patient tied her R shoe bimanually for the first time today.   Left up in wheelchair with safety belt in place and engaged.  Call bell and personal items in reach.    PM Session:  Patient received seated in bed finishing lunch.  Mom at bedside.  Patient agreeable to OT session. Mom indicates patient need to void before leaving - patient agrees.  Transferred to/from toilet with CGA via squat pivot.  Stood for hygiene, and min assist to manage pants on left.  Transported to gym to address NMR LUE.   Patient able to stand and place game pieces in board with left hand - working to address pinch, grasp, release, as well as interlimb coordination between hand/wrist/forearm and elbow.  Patient reporting tension in back - feel active tension in left lateral lower back especially with standing activities.  Working to increase comfort with maintaining active LLE with standing, with weight shifter slightly toward left.  Seated on physioball to release tension and provide active mobility in all directions to lower back.  Patient used UBE x 1 min with BUE without added resistance.  Needing cueing to maintain active grip with LUE.  Returned to room and to bed.  Bed alarm on and call bell/personal items in reach.  Mom at bedside.    Therapy Documentation Precautions:  Precautions Precautions: Fall Precaution Comments: L hemi Restrictions Weight Bearing Restrictions: No   Pain: Pain Assessment Pain Scale: 0-10 Pain Score: 0-No pain   Therapy/Group: Individual Therapy  [name] 09/27/2022, 12:16 PM

## 2022-09-27 NOTE — Progress Notes (Signed)
PROGRESS NOTE   Subjective/Complaints:  No new complaints this morning Appears alert Continues to have left sided wrist pain Not trying to get out of bed as per nursing  ROS: +left wrist pain   Objective:   No results found. Recent Labs    09/26/22 0633  WBC 11.8*  HGB 13.8  HCT 41.6  PLT 343   Recent Labs    09/26/22 0633  NA 137  K 3.7  CL 108  CO2 21*  GLUCOSE 107*  BUN 26*  CREATININE 0.99  CALCIUM 9.0    Intake/Output Summary (Last 24 hours) at 09/27/2022 0946 Last data filed at 09/27/2022 0848 Gross per 24 hour  Intake 240 ml  Output --  Net 240 ml        Physical Exam: Vital Signs Blood pressure 126/70, pulse 73, temperature 98.4 F (36.9 C), temperature source Oral, resp. rate 18, height 5\' 4"  (1.626 m), weight 87.8 kg, SpO2 100 %. Constitutional: No distress . Vital signs reviewed. BMI 33.23 HEENT: NCAT, EOMI, oral membranes moist Neck: supple Cardiovascular: RRR without murmur. No JVD    Respiratory/Chest: CTA Bilaterally without wheezes or rales. Normal effort    GI/Abdomen: BS +, non-tender, non-distended Ext: no clubbing, cyanosis, or edema Psych: flat, does awaken briefly and is cooperative. Skin: No evidence of breakdown, no evidence of rash Neurologic: slow to arouse. Follows commands.  Right sided motor strength is grossly 5/5,    LUE 3- at deltoid , bi, tri, grip, 3- Left HF, KE, 2- ADF  Sensory exam - pt does not attend consistently to participate     Assessment/Plan: 1. Functional deficits which require 3+ hours per day of interdisciplinary therapy in a comprehensive inpatient rehab setting. Physiatrist is providing close team supervision and 24 hour management of active medical problems listed below. Physiatrist and rehab team continue to assess barriers to discharge/monitor patient progress toward functional and medical goals  Care Tool:  Bathing    Body parts bathed  by patient: Chest, Abdomen, Face   Body parts bathed by helper: Right lower leg, Left lower leg, Right arm, Left arm, Front perineal area, Buttocks     Bathing assist Assist Level: Moderate Assistance - Patient 50 - 74%     Upper Body Dressing/Undressing Upper body dressing   What is the patient wearing?: Pull over shirt    Upper body assist Assist Level: Moderate Assistance - Patient 50 - 74%    Lower Body Dressing/Undressing Lower body dressing      What is the patient wearing?: Underwear/pull up, Pants     Lower body assist Assist for lower body dressing: Maximal Assistance - Patient 25 - 49%     Toileting Toileting    Toileting assist Assist for toileting: Moderate Assistance - Patient 50 - 74%     Transfers Chair/bed transfer  Transfers assist     Chair/bed transfer assist level: Minimal Assistance - Patient > 75% (squat pivot to R side)     Locomotion Ambulation   Ambulation assist      Assist level: Moderate Assistance - Patient 50 - 74% Assistive device: Parallel bars Max distance: 8 ft   Walk 10 feet  activity   Assist  Walk 10 feet activity did not occur: Safety/medical concerns        Walk 50 feet activity   Assist Walk 50 feet with 2 turns activity did not occur: Safety/medical concerns         Walk 150 feet activity   Assist Walk 150 feet activity did not occur: Safety/medical concerns         Walk 10 feet on uneven surface  activity   Assist Walk 10 feet on uneven surfaces activity did not occur: Safety/medical concerns         Wheelchair     Assist Is the patient using a wheelchair?: Yes Type of Wheelchair: Manual    Wheelchair assist level: Dependent - Patient 0%      Wheelchair 50 feet with 2 turns activity    Assist        Assist Level: Dependent - Patient 0%   Wheelchair 150 feet activity     Assist      Assist Level: Dependent - Patient 0%   Blood pressure 126/70, pulse 73,  temperature 98.4 F (36.9 C), temperature source Oral, resp. rate 18, height 5\' 4"  (1.626 m), weight 87.8 kg, SpO2 100 %.    Medical Problem List and Plan: 1. 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  Antithrombotics: -DVT/anticoagulation:  Pharmaceutical: Lovenox             -antiplatelet therapy: N/A 3. Pain Management:  On oxycodone and Fioricet prn.              --will start sleep chart. Added topamax--need to watch for sedation topamax as well              4. Mood/Behavior/Sleep: LCSW to follow for evaluation and support.  --Has a lot of stress, anxiety, insomnia-->hasn't wanted to get help per husband.  --chronic insomnia-->up/down multiple times during the night. --antipsychotic agents: N/A -consider stimulant trial for day time arousal 5. Neuropsych/cognition: This patient is capable of making decisions on her own behalf. 6. Skin/Wound Care: Routine pressure relief measures.  7. Fluids/Electrolytes/Nutrition: encourage PO -BUN still elevated 26 but trending down from 1/12 lab (30). 8. HTN: Monitor BP TID. Was not compliant with medications--does not want meds/doesn't like meds, so was noncompliant at home with meds prescribed.  -decrease amlodipine to 5mg  due to hypotension --On Norvasc, Cozaar, Hydralazine and Lopressor.             --SBP goal 130-150 range.   -at goal, supplement potassium  1/15 bp controlled 9. Hyperlipidemia: LDL 206- on Crestor. 10. Hyponatremia: Question SIADH. Recheck sodium today.  11. Leucocytosis: Likely reactive--still sl elevated  -afebrile --No signs of infection.  12. Acute on chronic Headaches w/ nausea: Took 4 aleve daily.  --Will add Claritin and nose spray to help with congestion as could be exacerbating  HA --Increased topamax to 100 mg HS. Added magnesium gluconate 250mg  HS 13. B12 deficiency: given IM B12 given 1/11, discussed that she may benefit from these monthly, or increasing consumption of meat.  14. Left wrist pain: discussed that XR results are negative 15. AKI:  start IVF at [facility] for one night  1/15 labs improved  -encourage PO 16. Pseudobulbar affect: Nudexta started 17. Insomnia: continue prn trazodone   LOS: 7 days A FACE TO FACE EVALUATION WAS PERFORMED  Kelly Stephenson 09/27/2022, 9:46 AM

## 2022-09-27 NOTE — Progress Notes (Signed)
Physical Therapy Session Note  Patient Details  Name: Kelly Stephenson MRN: [id number] Date of Birth: Feb 08, 1971  Today's Date: 09/27/2022 PT Individual Time: 5462-7035 PT Individual Time Calculation (min): 56 min   Short Term Goals: Week 1:  PT Short Term Goal 1 (Week 1): Pt will complete bed mobility with minA PT Short Term Goal 2 (Week 1): Pt will complete bed<>chair transfers with minA PT Short Term Goal 3 (Week 1): Pt will ambulate 55ft with minA and LRAD  Skilled Therapeutic Interventions/Progress Updates:     Pt sitting in w/c to start - pt agreeable to therapy but reports feeling "very tired." Unable to elaborate on details.   Transported to main rehab gym for time and wheeled inside // bars. Standing on foam wedge in // bars to work on weight bearing, balance, and stretching her L calf. Cues for keeping L Heel down and placing weight through toes to encourage stretching - standing tolerance limited to ~3 minutes due to fatigue.   Pt continuing to c/o feeling very tired.  BP sitting: 103/62 BP standing: 95/72 *Symptomatic. Care team notified via secure chat  Assisted patient onto mat table with stand<>pivot transfer, minA. Sit>supine without assist on mat table.   Mat table there-ex: -2x10 bridges -2x10 knee fall outs -2x10 SAQ with 5 sec knee isometric with bolster -2x10 heel slides *All completed on L, cues for sequencing and technique as needed.   Returned to w/c via minA stand pivot transfer and then returned to her room. Pt requesting to lie in bed 2/2/ fatigue. Assisted to bed in similar manner, removed tennis shoes with totalA, and assisted to bed with minA for LLE support. Alarm on, call bell in reach, family member present at bedside.     Therapy Documentation Precautions:  Precautions Precautions: Fall Precaution Comments: L hemi Restrictions Weight Bearing Restrictions: No General:   Vital Signs: Therapy Vitals Temp: 98.4 F (36.9 C) Temp Source:  Oral Pulse Rate: 73 Resp: 18 BP: 126/70 Patient Position (if appropriate): Lying Oxygen Therapy SpO2: 100 % O2 Device: Room Air Pain:   Mobility:   Locomotion :    Trunk/Postural Assessment :    Balance:   Exercises:   Other Treatments:      Therapy/Group: Individual Therapy  Rainier Feuerborn P Markail Diekman PT 09/27/2022, 7:31 AM

## 2022-09-28 ENCOUNTER — Inpatient Hospital Stay (HOSPITAL_COMMUNITY): Payer: BC Managed Care – PPO

## 2022-09-28 MED ORDER — TOPIRAMATE 25 MG PO TABS
25.0000 mg | ORAL_TABLET | Freq: Once | ORAL | Status: AC
  Administered 2022-09-28: 25 mg via ORAL
  Filled 2022-09-28: qty 1

## 2022-09-28 MED ORDER — TOPIRAMATE 25 MG PO TABS
25.0000 mg | ORAL_TABLET | Freq: Every day | ORAL | Status: DC
  Administered 2022-09-28 – 2022-09-29 (×2): 25 mg via ORAL
  Filled 2022-09-28 (×2): qty 1

## 2022-09-28 NOTE — Progress Notes (Signed)
PROGRESS NOTE   Subjective/Complaints: Continues to have headache this morning, will add daytime topamax as well. Headache is present at the top of the head, discussed CT results  ROS: +left wrist pain , +headache  Objective:   No results found. Recent Labs    09/26/22 0633  WBC 11.8*  HGB 13.8  HCT 41.6  PLT 343   Recent Labs    09/26/22 0633  NA 137  K 3.7  CL 108  CO2 21*  GLUCOSE 107*  BUN 26*  CREATININE 0.99  CALCIUM 9.0    Intake/Output Summary (Last 24 hours) at 09/28/2022 1101 Last data filed at 09/28/2022 7425 Gross per 24 hour  Intake 480 ml  Output --  Net 480 ml        Physical Exam: Vital Signs Blood pressure 126/68, pulse 94, temperature 98.7 F (37.1 C), resp. rate 17, height 5\' 4"  (1.626 m), weight 87.8 kg, SpO2 97 %. Constitutional: No distress . Vital signs reviewed. BMI 33.23 HEENT: NCAT, EOMI, oral membranes moist Neck: supple Cardiovascular: RRR without murmur. No JVD    Respiratory/Chest: CTA Bilaterally without wheezes or rales. Normal effort    GI/Abdomen: BS +, non-tender, non-distended Ext: no clubbing, cyanosis, or edema Psych: flat, does awaken briefly and is cooperative. Skin: No evidence of breakdown, no evidence of rash Neurologic: slow to arouse. Follows commands.  Right sided motor strength is grossly 5/5,    LUE 3- at deltoid , bi, tri, grip, 3- Left HF, KE, 2- ADF  Sensory exam - pt does not attend consistently to participate Left lower back TTP     Assessment/Plan: 1. Functional deficits which require 3+ hours per day of interdisciplinary therapy in a comprehensive inpatient rehab setting. Physiatrist is providing close team supervision and 24 hour management of active medical problems listed below. Physiatrist and rehab team continue to assess barriers to discharge/monitor patient progress toward functional and medical goals  Care Tool:  Bathing    Body  parts bathed by patient: Chest, Abdomen, Face   Body parts bathed by helper: Right lower leg, Left lower leg, Right arm, Left arm, Front perineal area, Buttocks     Bathing assist Assist Level: Moderate Assistance - Patient 50 - 74%     Upper Body Dressing/Undressing Upper body dressing   What is the patient wearing?: Pull over shirt    Upper body assist Assist Level: Moderate Assistance - Patient 50 - 74%    Lower Body Dressing/Undressing Lower body dressing      What is the patient wearing?: Underwear/pull up, Pants     Lower body assist Assist for lower body dressing: Maximal Assistance - Patient 25 - 49%     Toileting Toileting    Toileting assist Assist for toileting: Moderate Assistance - Patient 50 - 74%     Transfers Chair/bed transfer  Transfers assist     Chair/bed transfer assist level: Minimal Assistance - Patient > 75% (squat pivot to R side)     Locomotion Ambulation   Ambulation assist      Assist level: Moderate Assistance - Patient 50 - 74% Assistive device: Parallel bars Max distance: 8 ft   Walk 10  feet activity   Assist  Walk 10 feet activity did not occur: Safety/medical concerns        Walk 50 feet activity   Assist Walk 50 feet with 2 turns activity did not occur: Safety/medical concerns         Walk 150 feet activity   Assist Walk 150 feet activity did not occur: Safety/medical concerns         Walk 10 feet on uneven surface  activity   Assist Walk 10 feet on uneven surfaces activity did not occur: Safety/medical concerns         Wheelchair     Assist Is the patient using a wheelchair?: Yes Type of Wheelchair: Manual    Wheelchair assist level: Dependent - Patient 0%      Wheelchair 50 feet with 2 turns activity    Assist        Assist Level: Dependent - Patient 0%   Wheelchair 150 feet activity     Assist      Assist Level: Dependent - Patient 0%   Blood pressure 126/68,  pulse 94, temperature 98.7 F (37.1 C), resp. rate 17, height 5\' 4"  (1.626 m), weight 87.8 kg, SpO2 97 %.    Medical Problem List and Plan: 1. 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  Antithrombotics: -DVT/anticoagulation:  Pharmaceutical: Lovenox             -antiplatelet therapy: N/A 3. Headache: Scheduled topamax 100mg  HS. On oxycodone and Fioricet prn.              --will start sleep chart.               4. Mood/Behavior/Sleep: LCSW to follow for evaluation and support.  --Has a lot of stress, anxiety, insomnia-->hasn't wanted to get help per husband.  --chronic insomnia-->up/down multiple times during the night. --antipsychotic agents: N/A -consider stimulant trial for day time arousal 5. Neuropsych/cognition: This patient is capable of making decisions on her own behalf. 6. Skin/Wound Care: Routine pressure relief measures.  7. Fluids/Electrolytes/Nutrition: encourage PO -BUN still elevated 26 but trending down from 1/12 lab (30). 8. HTN: Monitor BP TID. Was not compliant with medications--does not want meds/doesn't like meds, so was noncompliant at home with meds prescribed.  -d/c amlodipine --On Norvasc, Cozaar, Hydralazine and Lopressor.             --SBP goal 130-150 range.   -at goal, supplement potassium  1/15 bp controlled 9. Hyperlipidemia: LDL 206- on Crestor. 10. Hyponatremia: Question SIADH. Recheck sodium today.  11. Leucocytosis: Likely reactive--still sl elevated  -afebrile --No signs of infection.  12. Acute on chronic Headaches w/ nausea: Took 4 aleve daily.  --Will add Claritin and nose spray to help with congestion as could be exacerbating HA --Increased topamax to 100 mg HS. Added magnesium gluconate 250mg  HS 13. B12  deficiency: given IM B12 given 1/11, discussed that she may benefit from these monthly, or increasing consumption of meat.  14. Left wrist pain: discussed that XR results are negative 15. AKI: start IVF at [facility] for one night  1/15 labs improved  -encourage  PO 16. Pseudobulbar affect: Nudexta started 17. Insomnia: continue prn trazodone, continue topamax 100mg  HS   LOS: 8 days A FACE TO FACE EVALUATION WAS PERFORMED  Reona Zendejas P Suda Forbess 09/28/2022, 11:01 AM

## 2022-09-28 NOTE — Progress Notes (Signed)
Patient ID: Kelly Stephenson, female   DOB: November 24, 1970, 52 y.o.   MRN: [id number]  Met with husband and pt's mom pt was trying to sleep due to headache to give team conference update of goals of CGA-supervision and new target discharge date of 2/1, needed extension due to loss days due to sleep issues and missed session due to medical issues. Discussed needs to have some consistency with progress from day to day. Both in agreement. Discussed closer with discuss equipment needs and recommended OP for therapies due to young age and needing to get out of the house. Will continue to work on discharge needs.

## 2022-09-28 NOTE — Progress Notes (Signed)
Orthopedic Tech Progress Note Patient Details:  Kelly Stephenson 1971-05-07 [id number] Left AFO has been ordered from [facility]  Patient ID: [name], female   DOB: [birth year], 52 y.o.   MRN: [id number]  Jearld Lesch 09/28/2022, 11:22 AM

## 2022-09-28 NOTE — Progress Notes (Addendum)
Speech Language Pathology Weekly Progress and Session Note  Patient Details  Name: Kelly Stephenson MRN: [id number] Date of Birth: 1971-06-03  Beginning of progress report period: September 21, 2022 End of progress report period: September 28, 2022  Today's Date: 09/28/2022 SLP Individual Time:  -     Short Term Goals: Week 1: SLP Short Term Goal 1 (Week 1): Patient will participate in further cognitive-linguistic evaluation to full completion for ongoing goal development - GOAL MET SLP Short Term Goal 2 (Week 1): Patient will orient x4 with min A for use of external aids. - GOAL MET SLP Short Term Goal 3 (Week 1): Patient will utilize memory compensations to recall biographical and/or novel information with mod A verbal/visual cues- GOAL MET SLP Short Term Goal 4 (Week 1): Patient will sustain attention to functional tasks for 3 minute intervals with mod A verbal redirection cues - GOAL MET SLP Short Term Goal 5 (Week 1): Patient will complete mildly complex problem solving with mod A verbal/visual cues to achieve 75% accuracy- GOAL MET   New Short Term Goals: Week 2: SLP Short Term Goal 1 (Week 2): Patient will orient x4 with sup A for use of external aids. SLP Short Term Goal 2 (Week 2): Patient will utilize memory compensations to recall biographical and/or novel information with min-mod A verbal/visual cues SLP Short Term Goal 3 (Week 2): Patient will sustain attention to functional tasks for 7 minute intervals with min A verbal redirection cues SLP Short Term Goal 4 (Week 2): Patient will complete basic complex problem solving with min-mod A verbal/visual cues to achieve 80% accuracy SLP Short Term Goal 5 (Week 2): Patient will participate in further cognitive-linguistic evaluation to full completion for ongoing goal development  Weekly Progress Updates: Pt demonstrating slow progress, as evident by meeting 4 out of 5 short-term goals outlined above. Currently, pt benefits from overall  min to min-mod level of assistance to complete basic cogntiive tasks r/t orientation, recall, attention, and problem-solving; long-term goal is for pt to achieve sup level of assistance by time of discharge. This week, pt has been limited by medical status, lethargy, and behavior/motivation. Pt and family education ongoing. Continue to recommend skilled ST intervention during CIR admission. Anticipate need for 24/7 supervision and assistance, as well as f/u ST intervention at next venue of care (OP vs HH).   Intensity: Minumum of 1-2 x/day, 30 to 90 minutes Frequency: 3 to 5 out of 7 days Duration/Length of Stay: 14-16 days Treatment/Interventions: Cognitive remediation/compensation;Internal/external aids;Functional tasks;Patient/family education;Therapeutic Activities   Daily Session  Skilled Therapeutic Interventions:     Pt seen this date for skilled ST intervention targeting cognitive goals outlined above. Pt received supine, and sleeping in bed. Reporting  severe headache. Agreeable to intervention at bedside with frequent rest breaks. Husband resting in recliner chair at bedside. Appears a little less confused this date. Less labile. Pt kept eyes closed for the majority of today's session when not completing visually demanding tasks.   Today's session with emphasis on cognitive skill training. Pt completed mildly-complex problem-solving task r/t counting bills and coins with overall Min-Mod A verbal and visual cues to achieve 100% accuracy; ~50% accuracy with only Sup A. Sustained attention to aforementioned visual task (counting money) for 3-4 minute intervals with Min A for redirection. Pt advocating for needs/cares with overall Sup A. Recalled biographical information with Sup A. Completed ADL organization and sequencing task (4-steps) with 100% accuracy given Sup A; 100% accuracy when sequencing 6-steps and given  Min A verbal and visual cues; ~50% with only Sup A. Limited by headache this  date.  Re: deglutition, pt accepted medications from RN and self-administered thin liquid via straw without overt s/sx concerning for dysphagia, though required Sup A for assistance with repositioning HOB upright during PO intake in the setting of headache. Of note, pt missed 15 minutes of skilled ST intervention due to headache.   Pt left in bed with all safety measures activated and call bell within reach. Husband remains at bedside. Continue per current ST POC.  Pain Pain reported - headache (back of head); RN notified and provided medication per MD orders.   Therapy/Group: Individual Therapy  [name] 09/28/2022, 3:07 PM

## 2022-09-28 NOTE — Plan of Care (Signed)
0700:Pt resting in bed at beginning of shift, pt alert and oriented x3-4, pt on room air, pt family at bedside  0900:Pt having bad headache/migraines, pt needing pain medication ,RN gave pt her oxycodone PRN as ordered.  Pt continuing to have constant ongoing pain with headaches, medication helping some but not relieving pain. Pt not wanting to eat during shift, pt keeping lights off and sleeping but able to wake up appropriately when spoken to.   At [facility] notified Dr.Krutika Raulkar that pt having ongoing headache pain even though pt receiving oxycodone.BP 126/68. MD ordered scheduled topamax and heat therapy, RN gave topamax, pt refused heat therapy.  RN gave Pt PRN oxycodone per order at 1346 and 1810.  At 1522 RN updated MD per pt family request pt still having headache but is sleeping most of the time, but when RN assess pt pain pt still having a bad headache, per pt family pt did not sleep well last night, pt did not work with PT due to pain. MD ordered more topamax and RN gave, MD ordered cold therapy and RN placed cold pack on pt's head area, and MD ordered CT of head.  1855:Pt went for CT of head  1900;RN gave report to oncoming shift

## 2022-09-28 NOTE — Progress Notes (Signed)
Occupational Therapy Weekly Progress Note  Patient Details  Name: Kelly Stephenson MRN: [id number] Date of Birth: 10-09-1970  Beginning of progress report period: September 21, 2022 End of progress report period: September 28, 2022  Today's Date: 09/28/2022 OT Individual Time: 1000-1055 OT Individual Time Calculation (min): 55 min    Patient has met 5 of 5 short term goals.  Improved attention, improved awareness and functional use of LUE, improved functional mobility, and overall improving endurance.    Patient continues to demonstrate the following deficits: abnormal tone, decreased coordination, and decreased motor planning, decreased attention to left, decreased initiation, decreased attention, decreased awareness, decreased problem solving, decreased safety awareness, decreased memory, and delayed processing, and decreased sitting balance, decreased standing balance, decreased postural control, hemiplegia, and decreased balance strategies and therefore will continue to benefit from skilled OT intervention to enhance overall performance with BADL.  Patient progressing toward long term goals..  Continue plan of care.  OT Short Term Goals Week 1:  OT Short Term Goal 1 (Week 1): Patient will bathe upper body with min assist OT Short Term Goal 1 - Progress (Week 1): Met OT Short Term Goal 2 (Week 1): Patient will bathe lower body with mod assist OT Short Term Goal 2 - Progress (Week 1): Met OT Short Term Goal 3 (Week 1): Patient will complete toilet transfer with min assist OT Short Term Goal 3 - Progress (Week 1): Met OT Short Term Goal 4 (Week 1): Patient will don/doff pull over shirt with min assist OT Short Term Goal 4 - Progress (Week 1): Met OT Short Term Goal 5 (Week 1): Patient will don/doff elastic waist pants with mod assist OT Short Term Goal 5 - Progress (Week 1): Met Week 2:  OT Short Term Goal 1 (Week 2): Patient will don upper body clothing with set up and cueing OT Short  Term Goal 2 (Week 2): Patient wilL utilize LUE to aide with hygiene and self feeding with only initial cueing OT Short Term Goal 3 (Week 2): Patient will tie shoes with min assist using bumanual technique OT Short Term Goal 4 (Week 2): Patient will complete toilet/ shower trasnfer with contact guard assist OT Short Term Goal 5 (Week 2): Patient will complete toileting with contact guard and cueing  Skilled Therapeutic Interventions/Progress Updates:    Patient received sleeping- supine.  Reporting headache.  Patient offered drink of water and assisted to sitting.  BP within functional parameters, although HR fast.  Patient willing/ able to transfer to wheelchair and to sink to complete light bathing and grooming tasks.  Patient maintained eyes close through 70% of session stating headache.  Patient consistently bringing left hand to face, top of head without prompting.  Movement delayed, proximal weakness - but spontaneous and functional. Patient assisted back to bed with cool washcloth on forehead.  Bed alarm engaged and call bell/ personal items in reach.    Therapy Documentation Precautions:  Precautions Precautions: Fall Precaution Comments: L hemi Restrictions Weight Bearing Restrictions: No  Vital Signs: Therapy Vitals Pulse Rate: 94 BP: 126/68 Patient Position (if appropriate): Sitting Pain: Pain Assessment Pain Scale: 0-10 Pain Score: 8  Pain Type: Acute pain Pain Location: Head Pain Descriptors / Indicators: Aching Pain Intervention(s): MD notified (Comment) (RN notified Dr.Raulkar pt having ongoing headache that did not improve after PRN pain medication, BP stable)   Therapy/Group: Individual Therapy  [name] 09/28/2022, 12:37 PM

## 2022-09-28 NOTE — Patient Care Conference (Signed)
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 Leeroy Cha Social Worker Present: Ovidio Kin, LCSW Nurse Present: Dorien Chihuahua, RN PT Present: Ginnie Smart, PT OT Present: Other (comment) Antony Salmon, OT) SLP Present: Weston Anna, SLP PPS Coordinator present : Gunnar Fusi, SLP     Current Status/Progress Goal Weekly Team Focus  Bowel/Bladder   Pt is continent of b/b. LBM: 1/15   Remain continent of b/b.   Assist w/ toileting needs q 2-4 hours & PRN.    Swallow/Nutrition/ Hydration               ADL's   mod/min assist BADL, Improving use of LUE with cueing   Supervision/min assist BADL   NMR LUE, Balance, activity tolerance, attention left, education family    Mobility   minA bed mobility, minA squat<>pivot transfers, minA gait using RW up to 11ft. modA stairs.   CGA overall  gait training, improving activity tolerance, LLE NMR    Communication                Safety/Cognition/ Behavioral Observations  Min-Mod A   Supervision   complex problem solving, recall with use of strategies, sustained attention, emergent supervision     Pain   Pt denies pain.   Remain pain free.   Assess pain q shift & PRN.    Skin   Skin is intact. Redness to L elbow, L great toe & R ankle - ointment applied per orders   Maintain skin integrity.  Assess skin q shift & PRN.      Discharge Planning:  HOme with husband and son with other family members coming in to assist. Finally sleeping hopefully can fully participate now   Team Discussion: Patient continues to have headaches, insomnia, little reserve and poor mental flexibility and consistency. MD adjusting medications. Left wrist pain improved and pseudobulbar affect improved with addition of Nudexta. MD added medications for insomnia, cream for psoriasis. Patient on target to meet rehab goals: yes, currently needs mod assist for ADLs, completes stand pivot transfers with min assist.  Able to ambulate up to 25" using a RW. Completes steps with mod assist.  Needs min - mod assist for problem solving. Goals for discharge.   *See Care Plan and progress notes for long and short-term goals.   Revisions to Treatment Plan:  Heating pad on left leg when using PRAFO Xray left wrist Behavior Management Plan activation   Teaching Needs: Safety, medications, transfers, toileting, etc.  Current Barriers to Discharge: Decreased caregiver support and Home enviroment access/layout  Possible Resolutions to Barriers: Family education     Medical Summary Current Status: Headache, left wrist pain, hypotension  Barriers to Discharge: Medical stability;Uncontrolled Pain;Hypotension  Barriers to Discharge Comments: Headache, left wrist pain, hypotension Possible Resolutions to [employer] Focus: continue topamax 100mg  at night, start topamax 25mg  during the day, CT head shows improvements in bleed, discussed with patient and family, XR ordered and is negative, d/c amlodipine   Continued Need for Acute Rehabilitation Level of Care: The patient requires daily medical management by a  physician with specialized training in physical medicine and rehabilitation for the following reasons: Direction of a multidisciplinary physical rehabilitation program to maximize functional independence : Yes Medical management of patient stability for increased activity during participation in an intensive rehabilitation regime.: Yes Analysis of laboratory values and/or radiology reports with any subsequent need for medication adjustment and/or medical intervention. : Yes   I attest that I was present, lead the team conference, and concur with the assessment and plan of the team.   Dorien Chihuahua B 09/28/2022, 2:04 PM

## 2022-09-28 NOTE — Progress Notes (Signed)
Physical Therapy Weekly Progress Note  Patient Details  Name: Kelly Stephenson MRN: [id number] Date of Birth: 07-Feb-1971  Beginning of progress report period: September 21, 2022 End of progress report period: September 28, 2022  Today's Date: 09/28/2022 PT Individual Time: 1130-1140  PT Individual Time Calculation (min): 10 min   PT Missed Time: 20 Minutes + 75 minutes Missed Time Reason: Patient ill (Comment) (headache)   Patient has met 2 of 3 short term goals. Pt is making slower than anticipated progress towards LTG of CGA. Barriers to progressions in therapy is primary related to cognition and impaired activity tolerance. Overall, she has made some progress with functional mobility - she requires minA for bed mobility, minA for squat<>pivot transfers, and skilled min/modA for ambulating ~69ft using RW with L foot ace wrapped for DF assist. She requires modA for safely navigating stairs.   Patient continues to demonstrate the following deficits muscle weakness and muscle joint tightness, decreased cardiorespiratoy endurance, impaired timing and sequencing, abnormal tone, unbalanced muscle activation, motor apraxia, and decreased coordination, decreased attention to left and decreased motor planning, decreased initiation, decreased attention, decreased awareness, decreased problem solving, and decreased safety awareness, and decreased standing balance, decreased postural control, hemiplegia, and decreased balance strategies and therefore will continue to benefit from skilled PT intervention to increase functional independence with mobility.  Patient progressing toward long term goals..  Continue plan of care.  PT Short Term Goals Week 1:  PT Short Term Goal 1 (Week 1): Pt will complete bed mobility with minA PT Short Term Goal 1 - Progress (Week 1): Met PT Short Term Goal 2 (Week 1): Pt will complete bed<>chair transfers with minA PT Short Term Goal 2 - Progress (Week 1): Met PT Short Term  Goal 3 (Week 1): Pt will ambulate 48ft with minA and LRAD PT Short Term Goal 3 - Progress (Week 1): Partly met Week 2:  PT Short Term Goal 1 (Week 2): Pt will complete bed mobility with CGA PT Short Term Goal 2 (Week 2): Pt will complete bed<>chair transfers with CGA and LRAD PT Short Term Goal 3 (Week 2): Pt will ambulate 43ft with minA and LRAD PT Short Term Goal 4 (Week 2): Pt will navigate up/down x4 steps with minA and LRAD  Skilled Therapeutic Interventions/Progress Updates:      1st session: Pt in bed sleeping with towel over her head. Family (mother and husband) at bedside. CSW also present to discuss AM conference.   Unable to encourage patient to mobilize OOB; pt deferring due to severe headache (MD aware). Discussed with family and patient importance of participation and mobilizing to regain function and strength. Offered to provide sunglasses for photo sensitivity but she deferred. Patient in agreement to participate at 1pm session. All needs met, provided ice water per request. She missed 20 minutes of skilled therapy due to headache.     2nd session: Pt sleeping in bed with her mother at bedside. Pt continues to c/o severe headache, does not open her eyes. She requests to sleep and defer therapy today. She missed 75 minutes of skilled therapy due to headache.       Therapy Documentation Precautions:  Precautions Precautions: Fall Precaution Comments: L hemi Restrictions Weight Bearing Restrictions: No General:    Therapy/Group: Individual Therapy  Kelly Stephenson P Kelly Stephenson PT 09/28/2022, 7:30 AM

## 2022-09-29 ENCOUNTER — Inpatient Hospital Stay (HOSPITAL_COMMUNITY): Payer: BC Managed Care – PPO

## 2022-09-29 LAB — BASIC METABOLIC PANEL
Anion gap: 10 (ref 5–15)
BUN: 26 mg/dL — ABNORMAL HIGH (ref 6–20)
CO2: 21 mmol/L — ABNORMAL LOW (ref 22–32)
Calcium: 9 mg/dL (ref 8.9–10.3)
Chloride: 99 mmol/L (ref 98–111)
Creatinine, Ser: 1.85 mg/dL — ABNORMAL HIGH (ref 0.44–1.00)
GFR, Estimated: 33 mL/min — ABNORMAL LOW (ref >60)
Glucose, Bld: 134 mg/dL — ABNORMAL HIGH (ref 70–99)
Potassium: 3.5 mmol/L (ref 3.5–5.1)
Sodium: 130 mmol/L — ABNORMAL LOW (ref 135–145)

## 2022-09-29 LAB — MAGNESIUM: Magnesium: 2.1 mg/dL (ref 1.7–2.4)

## 2022-09-29 MED ORDER — TOPIRAMATE 25 MG PO TABS
50.0000 mg | ORAL_TABLET | Freq: Every day | ORAL | Status: DC
  Administered 2022-10-01 – 2022-10-03 (×3): 50 mg via ORAL
  Filled 2022-09-29 (×4): qty 2

## 2022-09-29 MED ORDER — MAGNESIUM GLUCONATE 500 MG PO TABS
500.0000 mg | ORAL_TABLET | Freq: Every day | ORAL | Status: DC
  Administered 2022-10-01 – 2022-10-12 (×11): 500 mg via ORAL
  Filled 2022-09-29 (×13): qty 1

## 2022-09-29 MED ORDER — TOPIRAMATE 25 MG PO TABS: 125.0000 mg | ORAL_TABLET | Freq: Every day | ORAL | Status: DC

## 2022-09-29 MED ORDER — SODIUM CHLORIDE 0.9 % IV SOLN: INTRAVENOUS | Status: DC

## 2022-09-29 MED ORDER — HYDRALAZINE HCL 20 MG/ML IJ SOLN
10.0000 mg | Freq: Once | INTRAMUSCULAR | Status: AC
  Administered 2022-09-29: 10 mg via INTRAVENOUS
  Filled 2022-09-29: qty 1

## 2022-09-29 NOTE — Plan of Care (Signed)
  Problem: Consults Goal: RH STROKE PATIENT EDUCATION Description: See Patient Education module for education specifics  Outcome: Progressing   Problem: RH BOWEL ELIMINATION Goal: RH STG MANAGE BOWEL WITH ASSISTANCE Description: STG Manage Bowel with mod I Assistance. Outcome: Progressing Goal: RH STG MANAGE BOWEL W/MEDICATION W/ASSISTANCE Description: STG Manage Bowel with Medication with mod I Assistance. Outcome: Progressing   Problem: RH SAFETY Goal: RH STG ADHERE TO SAFETY PRECAUTIONS W/ASSISTANCE/DEVICE Description: STG Adhere to Safety Precautions With cues Assistance/Device. Outcome: Progressing   Problem: RH PAIN MANAGEMENT Goal: RH STG PAIN MANAGED AT OR BELOW PT'S PAIN GOAL Description: < 4 with prns Outcome: Not Progressing Note: Patient still continues with frequent increased headaches.   Problem: RH KNOWLEDGE DEFICIT Goal: RH STG INCREASE KNOWLEDGE OF HYPERTENSION Description: Patient and spouse will be able to manage HTN with medications and dietary modifications using educational resources independently Outcome: Progressing Goal: RH STG INCREASE KNOWLEGDE OF HYPERLIPIDEMIA Description: Patient and spouse will be able to manage HLD with medications and dietary modifications using educational resources independently Outcome: Progressing   Problem: Education: Goal: Knowledge of disease or condition will improve Outcome: Progressing Goal: Knowledge of secondary prevention will improve (MUST DOCUMENT ALL) Outcome: Progressing Goal: Knowledge of patient specific risk factors will improve Elta [place] N/A or DELETE if not current risk factor) Outcome: Progressing   Problem: Intracerebral Hemorrhage Tissue Perfusion: Goal: Complications of Intracerebral Hemorrhage will be minimized Outcome: Progressing   Problem: Coping: Goal: Will verbalize positive feelings about self Outcome: Progressing Goal: Will identify appropriate support needs Outcome: Progressing    Problem: Health Behavior/Discharge Planning: Goal: Ability to manage health-related needs will improve Outcome: Progressing Goal: Goals will be collaboratively established with patient/family Outcome: Progressing   Problem: Self-Care: Goal: Ability to participate in self-care as condition permits will improve Outcome: Progressing Goal: Verbalization of feelings and concerns over difficulty with self-care will improve Outcome: Progressing Goal: Ability to communicate needs accurately will improve Outcome: Progressing   Problem: Nutrition: Goal: Risk of aspiration will decrease Outcome: Progressing Goal: Dietary intake will improve Outcome: Progressing

## 2022-09-29 NOTE — Progress Notes (Addendum)
Pt has had a headache/migraine all day per pt and family members. States that it is a 10/10.Marland Kitchen Pain medication and night time med were attempted but pt became nauseous and vomited up meds. 2200 dose of meds were offered but pt refused d/t not feeling well and being nauseous still. Pt's BP is elevated in the 170's. Will call on call provider to see if we could possibly get an IV option for tonight.  2251. One time dose of hydralazine ordered by Risa Grill, PA-C. IV hydralazine given. Pt refused Compazine suppository and IM injection for nausea but states that she's still nauseous. Pt is also still c/o headache/migraine, but refuses to take oral medications. Will recheck BP at 2345. & will continue to try other interventions to relieve pt of pain.   2354. Pt was given compazine IV by Lovell Sheehan, RN. Per Pharmacy, it was okay to give compazine IV instead of IM. Pain medication was offered to pt again, but refused.  9983. BP systolic remains in the 382'N. Charge nurse, Katharine Look, RN, was able to encourage pt to take medications to help w/ BP and pain relief. Oxy was given, but pt ended up spitting hydralazine out. Will recheck BP later to see if the pain relief will help with the elevated BP.

## 2022-09-29 NOTE — Plan of Care (Signed)
Pt's plan of care adjusted to 15/7 after speaking with care team and discussed with MD in team conference as pt currently unable to tolerate current therapy schedule with OT, PT, and SLP.   

## 2022-09-29 NOTE — Progress Notes (Signed)
Call from attendant nurse regarding ongoing headache with accompanying nausea and vomiting. (Head CT done yesterday with decrease in size of hemorrhage.) Now with elevated BP and unable to take PO meds. Orders for compazine PR and hydralazine 10 mg IV times one now. Recheck BP in one hour.

## 2022-09-29 NOTE — Progress Notes (Signed)
Occupational Therapy Session Note  Patient Details  Name: Kelly Stephenson MRN: [id number] Date of Birth: 12-19-1970  Today's Date: 09/29/2022 OT Individual Time: 4034-7425 OT Individual Time Calculation (min): 58 min    Short Term Goals: Week 2:  OT Short Term Goal 1 (Week 2): Patient will don upper body clothing with set up and cueing OT Short Term Goal 2 (Week 2): Patient wilL utilize LUE to aide with hygiene and self feeding with only initial cueing OT Short Term Goal 3 (Week 2): Patient will tie shoes with min assist using bumanual technique OT Short Term Goal 4 (Week 2): Patient will complete toilet/ shower trasnfer with contact guard assist OT Short Term Goal 5 (Week 2): Patient will complete toileting with contact guard and cueing  Skilled Therapeutic Interventions/Progress Updates:  Pt greeted supine in bed, pt agreeable to OT intervention. Session focus on BADL reeducation, functional mobility, dynamic standing balance and decreasing overall caregiver burden.              Pt completed supien>sit with HOB elevated to 30* with MIN A. Pt noted to be sweaty and reports fatigue. Pt initially wanting to shower therefore pt completed squat pivot to w/c to R side with CGA. Total A transport into bathroom for shower, however nurse entered for meds and VS.  BP 115/63 ( 78) HR 88 bpm  Pt then declined getting in shower d/t fatigue. Pt completed UB dressing with MIN A and MOD A to don pants, pt needed assist to thread LLE into pants but able to stand with MINA  while OTA assisted pt with pulling pants up to waist line on L side. Pt continues to report feeling "foggy", education provided on hemorraghic effects.   Pt completed seated oral care at sink with MIN A to apply toothpaste. Total A transport to gym where pt worked on short distance functional mobility in gym with Rw with pt ambulating ~ 3 ft with Rw however BLEs noted to buckle however feel deficit more inattention/impaired motor planning  during short distance gait.   Ended session with pt seated in w/c with all needs within reach and safety belt alarm activated.                    Therapy Documentation Precautions:  Precautions Precautions: Fall Precaution Comments: L hemi Restrictions Weight Bearing Restrictions: No  Pain: unrated pain in HA, rest breaks provided as needed.     Therapy/Group: Individual Therapy  Corinne Ports [facility] 09/29/2022, 12:11 PM

## 2022-09-29 NOTE — Progress Notes (Signed)
Physical Therapy Session Note  Patient Details  Name: LETASHA KERSHAW MRN: [id number] Date of Birth: 07-07-1971  Today's Date: 09/29/2022 PT Individual Time: 1130-1155 + 1415-1500 (missed 30 minutes) PT Individual Time Calculation (min): 25 min  + 45 min  Short Term Goals: Week 2:  PT Short Term Goal 1 (Week 2): Pt will complete bed mobility with CGA PT Short Term Goal 2 (Week 2): Pt will complete bed<>chair transfers with CGA and LRAD PT Short Term Goal 3 (Week 2): Pt will ambulate 56ft with minA and LRAD PT Short Term Goal 4 (Week 2): Pt will navigate up/down x4 steps with minA and LRAD  Skilled Therapeutic Interventions/Progress Updates:      1st session: Pt sleeping in bed with her mother at the bedside. Pt responds to voice but keeps her eyes closed. Patient needing max encouragement and coaxing to mobilize OOB as she missed all her PT yesterday.   minA needed for bed mobility and cues for awareness to her L arm as it gets awkwardly placed behind her without correcting or acknowledging. Completed squat<>pivot transfer to the recliner with minA from the EOB. Assist needed for repositioning. Pillows provided for positioning and comfort.   Discussed with the mother possible need for reducing therapy frequency due to inability to currently tolerate 3+ hrs/daily. Provided patient with ice water and encouraged her to stay OOB in chair until PM PT session. Discussed goals for today are completing therapy sessions. Safety belt alarm on, all needs met.     2nd session: Pt sleeping in bed on arrival with mother and husband at bedside. Mother reports that patient sat up for an hour after last session but then got sick after eating lunch, vomitting and asking to return to bed.   Pt still sleepy - responds to voice but has trouble keeping eyes open due to fatigue. Agreeable to session with coaxing.  Supine<>sitting EOB with minA for initiation and trunk support. Squat<>pivot transfer with  CGA/minA to w/c and then transported to main rehab gym for energy conservation.   Sit<>stand to high/low table - worked on UE weight bearing in modified plantigrade positioning as well as sorting and matching cards to challenge cognition - tasks completed in standing but very limited standing tolerance due to fatigue.  Checked BP to rule out orthostasis.  BP sitting: 126/74 BP standing: 107/67 *Pt symptomatic.  Donned knee-high compression socks BP sitting:114/74 BP standing: *pt unable to tolerate standing long enough to take  Attempted to complete w/c level tasks in sitting to avoid hypotension in standing. Pt falling asleep in w/c despite encouragement and stimulus. Returned to her room and assisted to bed with minA squat<>pivot transfer. MinA needed for returning to bed. Concluded session with alarm on and all needs met.    Therapy Documentation Precautions:  Precautions Precautions: Fall Precaution Comments: L hemi Restrictions Weight Bearing Restrictions: No General:     Therapy/Group: Individual Therapy  Lynnleigh Soden P Kacee Sukhu PT 09/29/2022, 7:39 AM

## 2022-09-29 NOTE — Progress Notes (Signed)
SLP Cancellation Note  Patient Details Name: Kelly Stephenson MRN: [id number] DOB: 1970-10-03   Cancelled treatment:      SLP attempted to see pt for scheduled ST intervention; however, pt sleeping soundly; snoring with pt's visitor reporting that she did not sleep at all last evening. Furthermore, reports that she has not eaten anything in the last 24 hours; will message MD. Due to ongoing fatigue, pt missed entire 45 minute session. Will continue efforts to make up as able.   [name] 09/29/2022, 10:40 AM

## 2022-09-29 NOTE — Progress Notes (Signed)
PROGRESS NOTE   Subjective/Complaints: Mrs. Pearlman feels very tired this morning Headache has improved Does not appear to have slept well last night  Will d/c AM topamax and instead increase HS topamax to 125mg   ROS: +left wrist pain , +headache, +fatigue  Objective:   CT HEAD WO CONTRAST ( )  Result Date: 09/29/2022 CLINICAL DATA:  Worsening headache EXAM: CT HEAD WITHOUT CONTRAST TECHNIQUE: Contiguous axial images were obtained from the base of the skull through the vertex without intravenous contrast. RADIATION DOSE REDUCTION: This exam was performed according to the departmental dose-optimization program which includes automated exposure control, adjustment of the mA and/or kV according to patient size and/or use of iterative reconstruction technique. 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 Likely unchanged surrounding edema and mild regional mass effect. No acute infarct, new hemorrhage, mass, or midline shift. No hydrocephalus or extra-axial collection. Vascular: No hyperdense vessel. Skull: Negative for fracture or focal lesion. Sinuses/Orbits: Clear paranasal sinuses. No acute finding in the orbits. Other: The mastoids are well aerated. IMPRESSION: 1. 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 2. No acute intracranial process. Electronically Signed   By: 09/26/2022 M.D.   On: 09/29/2022 01:43   No results for input(s): "WBC", "HGB", "HCT", "PLT" in the last 72 hours.  No results for input(s): "NA", "K", "CL", "CO2", "GLUCOSE", "BUN", "CREATININE", "CALCIUM" in the last 72 hours.   Intake/Output Summary (Last 24 hours) at 09/29/2022 1012 Last data filed at 09/28/2022 1851 Gross  per 24 hour  Intake 0 ml  Output --  Net 0 ml        Physical Exam: Vital Signs Blood pressure 115/63, pulse 88, temperature 98.3 F (36.8 C), resp. rate 20, height 5\' 4"  (1.626 m), weight 87.8 kg, SpO2 96 %. Constitutional: No distress . Vital signs reviewed. BMI 33.23, fatigued HEENT: NCAT, EOMI, oral membranes moist Neck: supple Cardiovascular: RRR without murmur. No JVD    Respiratory/Chest: CTA Bilaterally without wheezes or rales. Normal effort    GI/Abdomen: BS +, non-tender, non-distended Ext: no clubbing, cyanosis, or edema Psych: flat, does awaken briefly and is cooperative. Skin: No evidence of breakdown, no evidence of rash Neurologic: slow to arouse. Follows commands.  Right sided motor strength is grossly 5/5,    LUE 3- at deltoid , bi, tri, grip, 3- Left HF, KE, 2- ADF  Sensory exam - pt does not attend consistently to participate Left lower back TTP     Assessment/Plan: 1. Functional deficits which require 3+ hours per day of interdisciplinary therapy in a comprehensive inpatient rehab setting. Physiatrist is providing close team supervision and 24 hour management of active medical problems listed below. Physiatrist and rehab team continue to assess barriers to discharge/monitor patient progress toward functional and medical goals  Care Tool:  Bathing    Body parts bathed by patient: Left arm, Chest, Abdomen, Front perineal area, Buttocks, Right upper leg, Left upper leg, Right lower leg, Face   Body parts bathed by helper: Left lower  leg, Right arm     Bathing assist Assist Level: Minimal Assistance - Patient > 75%     Upper Body Dressing/Undressing Upper body dressing   What is the patient wearing?: Pull over shirt    Upper body assist Assist Level: Set up assist    Lower Body Dressing/Undressing Lower body dressing      What is the patient wearing?: Pants, Incontinence brief     Lower body assist Assist for lower body dressing: Moderate  Assistance - Patient 50 - 74%     Toileting Toileting    Toileting assist Assist for toileting: Minimal Assistance - Patient > 75%     Transfers Chair/bed transfer  Transfers assist     Chair/bed transfer assist level: Minimal Assistance - Patient > 75%     Locomotion Ambulation   Ambulation assist      Assist level: Moderate Assistance - Patient 50 - 74% Assistive device: Parallel bars Max distance: 8 ft   Walk 10 feet activity   Assist  Walk 10 feet activity did not occur: Safety/medical concerns        Walk 50 feet activity   Assist Walk 50 feet with 2 turns activity did not occur: Safety/medical concerns         Walk 150 feet activity   Assist Walk 150 feet activity did not occur: Safety/medical concerns         Walk 10 feet on uneven surface  activity   Assist Walk 10 feet on uneven surfaces activity did not occur: Safety/medical concerns         Wheelchair     Assist Is the patient using a wheelchair?: Yes Type of Wheelchair: Manual    Wheelchair assist level: Dependent - Patient 0%      Wheelchair 50 feet with 2 turns activity    Assist        Assist Level: Dependent - Patient 0%   Wheelchair 150 feet activity     Assist      Assist Level: Dependent - Patient 0%   Blood pressure 115/63, pulse 88, temperature 98.3 F (36.8 C), resp. rate 20, height 5\' 4"  (1.626 m), weight 87.8 kg, SpO2 96 %.    Medical Problem List and Plan: 1. 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  Antithrombotics: -DVT/anticoagulation:  Pharmaceutical: Lovenox             -antiplatelet therapy: N/A 3. Headache: Scheduled topamax 100mg  HS. On oxycodone and Fioricet prn.              --will start sleep chart.               4. Mood/Behavior/Sleep: LCSW  to follow for evaluation and support.  --Has a lot of stress, anxiety, insomnia-->hasn't wanted to get help per husband.  --chronic insomnia-->up/down multiple times during the night. --antipsychotic agents: N/A -consider stimulant trial for day time arousal 5. Neuropsych/cognition: This patient is capable of making decisions on her own behalf. 6. Skin/Wound Care: Routine pressure relief measures.  7. Fluids/Electrolytes/Nutrition: encourage PO -BUN still elevated 26 but trending down from 1/12 lab (30). 8. HTN: Monitor BP TID. Was not compliant with medications--does not want meds/doesn't  like meds, so was noncompliant at home with meds prescribed.  -d/c amlodipine, increase magnesium to 500mg  HS --On Norvasc, Cozaar, Hydralazine and Lopressor.             --SBP goal 130-150 range.   -at goal, supplement potassium  1/15 bp controlled 9. Hyperlipidemia: LDL 206- on Crestor. 10. Hyponatremia: Question SIADH. Recheck sodium today.  11. Leucocytosis: Likely reactive--still sl elevated  -afebrile --No signs of infection.  12. Acute on chronic Headaches w/ nausea: Took 4 aleve daily.  --Will add Claritin and nose spray to help with congestion as could be exacerbating HA --Increased topamax to 100 mg HS. Added magnesium gluconate 250mg  HS 13. B12 deficiency: given IM B12 given 1/11, discussed that she may benefit from these monthly, or increasing consumption of meat.  14. Left wrist pain: discussed that XR results are negative 15. AKI: check creatinine today 16. Pseudobulbar affect: Nudexta started 17. Insomnia: continue prn trazodone, continue topamax 100mg  HS, add melatonin 3mg  HS 18. Hypokalemia: check potassium today 19. Daytime somnolence: d/c klonopin and daytime topamax   LOS: 9 days A FACE TO FACE EVALUATION WAS PERFORMED  Rayelynn Loyal P Freddye Cardamone 09/29/2022, 10:12 AM

## 2022-09-30 ENCOUNTER — Inpatient Hospital Stay (HOSPITAL_COMMUNITY): Payer: BC Managed Care – PPO

## 2022-09-30 DIAGNOSIS — E871 Hypo-osmolality and hyponatremia: Secondary | ICD-10-CM

## 2022-09-30 DIAGNOSIS — I160 Hypertensive urgency: Secondary | ICD-10-CM

## 2022-09-30 DIAGNOSIS — N179 Acute kidney failure, unspecified: Secondary | ICD-10-CM

## 2022-09-30 DIAGNOSIS — A419 Sepsis, unspecified organism: Secondary | ICD-10-CM

## 2022-09-30 DIAGNOSIS — R519 Headache, unspecified: Secondary | ICD-10-CM

## 2022-09-30 DIAGNOSIS — I610 Nontraumatic intracerebral hemorrhage in hemisphere, subcortical: Secondary | ICD-10-CM

## 2022-09-30 DIAGNOSIS — R7401 Elevation of levels of liver transaminase levels: Secondary | ICD-10-CM

## 2022-09-30 LAB — CBC WITH DIFFERENTIAL/PLATELET
Abs Immature Granulocytes: 0.16 10*3/uL — ABNORMAL HIGH (ref 0.00–0.07)
Basophils Absolute: 0 10*3/uL (ref 0.0–0.1)
Basophils Relative: 0 %
Eosinophils Absolute: 0.2 10*3/uL (ref 0.0–0.5)
Eosinophils Relative: 2 %
HCT: 37.6 % (ref 36.0–46.0)
Hemoglobin: 13.7 g/dL (ref 12.0–15.0)
Immature Granulocytes: 2 %
Lymphocytes Relative: 4 %
Lymphs Abs: 0.4 10*3/uL — ABNORMAL LOW (ref 0.7–4.0)
MCH: 28.8 pg (ref 26.0–34.0)
MCHC: 36.4 g/dL — ABNORMAL HIGH (ref 30.0–36.0)
MCV: 79.2 fL — ABNORMAL LOW (ref 80.0–100.0)
Monocytes Absolute: 0.4 10*3/uL (ref 0.1–1.0)
Monocytes Relative: 4 %
Neutro Abs: 9 10*3/uL — ABNORMAL HIGH (ref 1.7–7.7)
Neutrophils Relative %: 88 %
Platelets: 236 10*3/uL (ref 150–400)
RBC: 4.75 MIL/uL (ref 3.87–5.11)
RDW: 12.5 % (ref 11.5–15.5)
WBC: 10.2 10*3/uL (ref 4.0–10.5)
nRBC: 0 % (ref 0.0–0.2)

## 2022-09-30 LAB — BASIC METABOLIC PANEL
Anion gap: 12 (ref 5–15)
BUN: 27 mg/dL — ABNORMAL HIGH (ref 6–20)
CO2: 18 mmol/L — ABNORMAL LOW (ref 22–32)
Calcium: 8.7 mg/dL — ABNORMAL LOW (ref 8.9–10.3)
Chloride: 100 mmol/L (ref 98–111)
Creatinine, Ser: 1.49 mg/dL — ABNORMAL HIGH (ref 0.44–1.00)
GFR, Estimated: 42 mL/min — ABNORMAL LOW (ref >60)
Glucose, Bld: 114 mg/dL — ABNORMAL HIGH (ref 70–99)
Potassium: 3.3 mmol/L — ABNORMAL LOW (ref 3.5–5.1)
Sodium: 130 mmol/L — ABNORMAL LOW (ref 135–145)

## 2022-09-30 LAB — URINALYSIS, ROUTINE W REFLEX MICROSCOPIC
Bilirubin Urine: NEGATIVE
Glucose, UA: NEGATIVE mg/dL
Ketones, ur: 20 mg/dL — AB
Leukocytes,Ua: NEGATIVE
Nitrite: NEGATIVE
Protein, ur: NEGATIVE mg/dL
Specific Gravity, Urine: 1.013 (ref 1.005–1.030)
pH: 5 (ref 5.0–8.0)

## 2022-09-30 LAB — LACTIC ACID, PLASMA
Lactic Acid, Venous: 0.8 mmol/L (ref 0.5–1.9)
Lactic Acid, Venous: 0.8 mmol/L (ref 0.5–1.9)

## 2022-09-30 LAB — COMPREHENSIVE METABOLIC PANEL
ALT: 361 U/L — ABNORMAL HIGH (ref 0–44)
AST: 194 U/L — ABNORMAL HIGH (ref 15–41)
Albumin: 3.1 g/dL — ABNORMAL LOW (ref 3.5–5.0)
Alkaline Phosphatase: 356 U/L — ABNORMAL HIGH (ref 38–126)
Anion gap: 11 (ref 5–15)
BUN: 20 mg/dL (ref 6–20)
CO2: 19 mmol/L — ABNORMAL LOW (ref 22–32)
Calcium: 8.6 mg/dL — ABNORMAL LOW (ref 8.9–10.3)
Chloride: 98 mmol/L (ref 98–111)
Creatinine, Ser: 1.2 mg/dL — ABNORMAL HIGH (ref 0.44–1.00)
GFR, Estimated: 55 mL/min — ABNORMAL LOW (ref >60)
Glucose, Bld: 117 mg/dL — ABNORMAL HIGH (ref 70–99)
Potassium: 3.1 mmol/L — ABNORMAL LOW (ref 3.5–5.1)
Sodium: 128 mmol/L — ABNORMAL LOW (ref 135–145)
Total Bilirubin: 0.6 mg/dL (ref 0.3–1.2)
Total Protein: 6.6 g/dL (ref 6.5–8.1)

## 2022-09-30 LAB — PROTIME-INR
INR: 1.2 (ref 0.8–1.2)
Prothrombin Time: 15.3 seconds — ABNORMAL HIGH (ref 11.4–15.2)

## 2022-09-30 LAB — APTT: aPTT: 27 seconds (ref 24–36)

## 2022-09-30 LAB — OSMOLALITY: Osmolality: 274 mOsm/kg — ABNORMAL LOW (ref 275–295)

## 2022-09-30 LAB — PROCALCITONIN: Procalcitonin: 1.18 ng/mL

## 2022-09-30 LAB — OSMOLALITY, URINE: Osmolality, Ur: 527 mOsm/kg (ref 300–900)

## 2022-09-30 MED ORDER — POTASSIUM CHLORIDE 10 MEQ/100ML IV SOLN
10.0000 meq | INTRAVENOUS | Status: AC
  Administered 2022-09-30 (×2): 10 meq via INTRAVENOUS
  Filled 2022-09-30 (×4): qty 100

## 2022-09-30 MED ORDER — POTASSIUM CHLORIDE 10 MEQ/100ML IV SOLN
10.0000 meq | INTRAVENOUS | Status: DC
  Filled 2022-09-30 (×3): qty 100

## 2022-09-30 MED ORDER — VANCOMYCIN HCL 1500 MG/300ML IV SOLN
1500.0000 mg | Freq: Once | INTRAVENOUS | Status: AC
  Administered 2022-09-30: 1500 mg via INTRAVENOUS
  Filled 2022-09-30: qty 300

## 2022-09-30 MED ORDER — PIPERACILLIN-TAZOBACTAM 3.375 G IVPB
3.3750 g | Freq: Three times a day (TID) | INTRAVENOUS | Status: DC
  Administered 2022-10-01 (×2): 3.375 g via INTRAVENOUS
  Filled 2022-09-30: qty 50

## 2022-09-30 MED ORDER — HYDRALAZINE HCL 20 MG/ML IJ SOLN
10.0000 mg | INTRAMUSCULAR | Status: DC | PRN
  Administered 2022-09-30 (×2): 10 mg via INTRAVENOUS
  Filled 2022-09-30 (×2): qty 1

## 2022-09-30 MED ORDER — PIPERACILLIN-TAZOBACTAM 3.375 G IVPB 30 MIN
3.3750 g | Freq: Once | INTRAVENOUS | Status: AC
  Administered 2022-09-30: 3.375 g via INTRAVENOUS
  Filled 2022-09-30: qty 50

## 2022-09-30 MED ORDER — METOPROLOL TARTRATE 5 MG/5ML IV SOLN
5.0000 mg | Freq: Four times a day (QID) | INTRAVENOUS | Status: DC | PRN
  Administered 2022-10-01: 5 mg via INTRAVENOUS
  Filled 2022-09-30 (×2): qty 5

## 2022-09-30 MED ORDER — METOPROLOL TARTRATE 5 MG/5ML IV SOLN: 10.0000 mg | Freq: Once | INTRAVENOUS | Status: DC

## 2022-09-30 MED ORDER — ACETAMINOPHEN 650 MG RE SUPP
650.0000 mg | RECTAL | Status: DC | PRN
  Administered 2022-09-30: 650 mg via RECTAL
  Filled 2022-09-30: qty 1

## 2022-09-30 MED ORDER — ONDANSETRON HCL 4 MG/2ML IJ SOLN
4.0000 mg | Freq: Four times a day (QID) | INTRAMUSCULAR | Status: DC | PRN
  Administered 2022-09-30 – 2022-10-01 (×2): 4 mg via INTRAVENOUS
  Filled 2022-09-30 (×2): qty 2

## 2022-09-30 MED ORDER — METRONIDAZOLE 500 MG/100ML IV SOLN
500.0000 mg | Freq: Three times a day (TID) | INTRAVENOUS | Status: DC
  Filled 2022-09-30 (×2): qty 100

## 2022-09-30 MED ORDER — POTASSIUM CHLORIDE 10 MEQ/100ML IV SOLN
10.0000 meq | INTRAVENOUS | Status: AC
  Administered 2022-09-30 – 2022-10-01 (×2): 10 meq via INTRAVENOUS
  Filled 2022-09-30 (×2): qty 100

## 2022-09-30 MED ORDER — METOPROLOL TARTRATE 5 MG/5ML IV SOLN
5.0000 mg | Freq: Once | INTRAVENOUS | Status: AC
  Administered 2022-09-30: 5 mg via INTRAVENOUS
  Filled 2022-09-30: qty 5

## 2022-09-30 MED ORDER — VANCOMYCIN HCL 750 MG/150ML IV SOLN
750.0000 mg | INTRAVENOUS | Status: DC
  Filled 2022-09-30: qty 150

## 2022-09-30 MED ORDER — POTASSIUM CHLORIDE 20 MEQ PO PACK
40.0000 meq | PACK | Freq: Once | ORAL | Status: DC
  Filled 2022-09-30 (×2): qty 2

## 2022-09-30 NOTE — Progress Notes (Signed)
Physical Therapy Session Note  Patient Details  Name: Kelly Stephenson MRN: [id number] Date of Birth: 03-09-1971  Today's Date: 09/30/2022 PT Individual Time: 1030-1040 PT Individual Time Calculation (min): 10 min  and Today's Date: 09/30/2022 PT Missed Time: 35 Minutes Missed Time Reason: Patient ill (Comment)  Short Term Goals: Week 2:  PT Short Term Goal 1 (Week 2): Pt will complete bed mobility with CGA PT Short Term Goal 2 (Week 2): Pt will complete bed<>chair transfers with CGA and LRAD PT Short Term Goal 3 (Week 2): Pt will ambulate 57ft with minA and LRAD PT Short Term Goal 4 (Week 2): Pt will navigate up/down x4 steps with minA and LRAD  Skilled Therapeutic Interventions/Progress Updates:      Pt in bed, appearing uncomfortable and reporting "severe headache." Mother at bedside to assist with calming. NT called for vitals. Resting HR in 130's and BP elevated 194/112. Assisted with repositioning in bed and for vitals as patient had trouble staying still, repeating herself "help me, help me."  Plan to QD patient due to inability to tolerate 15/7 therapies. Care team notified via secure chat. All needs met at end of session. She missed 35 minutes of therapy.    Therapy Documentation Precautions:  Precautions Precautions: Fall Precaution Comments: L hemi Restrictions Weight Bearing Restrictions: No General:   Vital Signs: Therapy Vitals Temp: 97.8 F (36.6 C) Temp Source: Oral Pulse Rate: 98 Resp: 18 BP: (!) 170/90 Patient Position (if appropriate): Lying Oxygen Therapy SpO2: 98 % O2 Device: Room Air    Therapy/Group: Individual Therapy  Keitra Carusone P Tresia Revolorio 09/30/2022, 7:26 AM

## 2022-09-30 NOTE — Progress Notes (Signed)
Pt was finally able to take her medications this morning including her pain medication.

## 2022-09-30 NOTE — Sepsis Progress Note (Signed)
Sepsis protocol is being followed by [facility]. 

## 2022-09-30 NOTE — Progress Notes (Signed)
Spoke with Olin Hauser, [place] managing this patient on the unit. Current IV working well with blood return. Second IV not needed at this time.

## 2022-09-30 NOTE — Progress Notes (Signed)
Patient ID: Kelly Stephenson, female   DOB: [birth year], 52 y.o.   MRN: [id number]  Patient refusing PO medications, states that she will vomit. Complains of 10/10 headache. BP elevated all morning, HR elevated all morning. IV Compazine given for nausea with no change. IV Hydralazine given for elevated BP with no change. KUB ordered. Pam Love, PA-C placed orders for CT of head and abdomen. Rapid Response called to assess patient. Code sepsis initiated. Red mews with every hour vital signs initiated. Pam Love, PA-C and Dr. Ranell Patrick aware and monitoring patient labs and scans.

## 2022-09-30 NOTE — Progress Notes (Addendum)
Discussed patient with Dr. Carlean Purl who reviewed the chart and felt that patient's symptoms likely due to cholecystitis. GB ultrasound pending and patient has defervesced and stable. Supportive care recommended--she is on IVF and antibiotics. He will follow up in am. Dr. Dagoberto Ligas updated.   Runs of K ordered for hypokalemia. Patient's husband updated on above.

## 2022-09-30 NOTE — Progress Notes (Signed)
Pharmacy Antibiotic Note  Kelly Stephenson is a 52 y.o. female admitted on 09/20/2022 with sepsis from possible intra-abdominal source/origin. Pharmacy has been consulted for vancomycin and Zoysn dosing. New AKI, renal function slightly improved but will conservatively adjust vancomycin until renal function has recovered.  Plan: Vancomycin 1500 mg IV x1, followed by vancomycin 750 mg IV Q24h (eAUC 469, goal AUC 400-550, Scr 1.49, Vd 0.5) Zosyn 3.375g IV Q8h  Trend WBC, fever, renal function F/u cultures, clinical progress, levels as indicated De-escalate when able  Height: 5\' 4"  (162.6 cm) Weight: 87.8 kg (193 lb 9 oz) IBW/kg (Calculated) : 54.7  Temp (24hrs), Avg:99.8 F (37.7 C), Min:97.8 F (36.6 C), Max:102.3 F (39.1 C)  Recent Labs  Lab 09/26/22 0633 09/29/22 1059 09/30/22 0634 09/30/22 1303  WBC 11.8*  --   --  10.2  CREATININE 0.99 1.85* 1.49*  --   LATICACIDVEN  --   --   --  0.8    Estimated Creatinine Clearance: 47.9 mL/min (A) (by C-G formula based on SCr of 1.49 mg/dL (H)).    No Known Allergies  Microbiology results: 1/19 BCx: pending collection 1/19 UCx: pending collection    Thank you for allowing pharmacy to be a part of this patient's care.  Ardyth Harps, PharmD Clinical Pharmacist

## 2022-09-30 NOTE — Progress Notes (Signed)
Occupational Therapy Session Note  Patient Details  Name: Kelly Stephenson MRN: [id number] Date of Birth: Sep 30, 1970  Today's Date: 09/30/2022 OT Individual Time: 5916-3846 OT Individual Time Calculation (min): 13 min    Short Term Goals: Week 2:  OT Short Term Goal 1 (Week 2): Patient will don upper body clothing with set up and cueing OT Short Term Goal 2 (Week 2): Patient wilL utilize LUE to aide with hygiene and self feeding with only initial cueing OT Short Term Goal 3 (Week 2): Patient will tie shoes with min assist using bumanual technique OT Short Term Goal 4 (Week 2): Patient will complete toilet/ shower trasnfer with contact guard assist OT Short Term Goal 5 (Week 2): Patient will complete toileting with contact guard and cueing  Skilled Therapeutic Interventions/Progress Updates:    Patient supine in bed sleeping upon arrival.  Patient able to wake enough to speak/ answer questions.  Patient oriented to self, place.  Maintains eyes closed.  Reports left hip pain - lying on left side. Assisted patient to roll to right side - pillow placed between legs.  Patient reports hip feels better.  Asking for ice water - took two small sips.  Declined option to get out of bed due to nausea and headache. Communicated with treatment team patient's inability to participate this am.   Patient left in bed with call bell beside her, and bed alarm on.  Husband sleeping in recliner next to patient.    Therapy Documentation Precautions:  Precautions Precautions: Fall Precaution Comments: L hemi Restrictions Weight Bearing Restrictions: No General: General OT Amount of Missed Time: 32 Minutes Pain: 6/10 headache   Therapy/Group: Individual Therapy  [name] 09/30/2022, 8:25 AM

## 2022-09-30 NOTE — Significant Event (Signed)
Rapid Response Event Note   Reason for Call :  Concern for sepsis  Initial Focused Assessment:  Pt lying in bed, AO. Face is flush. Rash noted to patient's arms upper back and chest- this was newly noted this morning. Lung sounds are clear. Tachypnea, unlabored. Abdomen is soft, bowel sounds are faint. Skin is warm, dry, pink. Pt endorses headache, she is moaning "help me".   VS: T 101.80F, BP 198/92, HR 125, RR 28, SpO2 95% on room air  Interventions:  -Sepsis order set initiated  Plan of Care:  -Code sepsis -TRH consult per PA  Addendum 09/30/2022 1630: Rounded back on patient. Pt currently resting. Family at bedside states that she has not had much sleep during her hospitalization.  Recent VS: T 97.63F, BP 157/98, HR 112, RR 18, SpO2 95% on room air  Event Summary:  MD Notified: Lezlie Lye, PA Call Time: [place] Time: 1355 End Time: [place], RN

## 2022-09-30 NOTE — Plan of Care (Signed)
Pt's plan of care adjusted to QD after speaking with care team and discussed with MD in team conference as pt currently unable to tolerate current therapy schedule with OT, PT, and SLP.   

## 2022-09-30 NOTE — Consult Note (Signed)
Initial Consultation Note   Patient: Kelly Stephenson ZOX:[id number] DOB: Jan 09, 1971 PCP: Birdie Sons, MD DOA: 09/20/2022 DOS: the patient was seen and examined on 09/30/2022 Primary service: Izora Ribas, MD   Reason for consult: Evaluation of SIADH  Assessment and Plan:  Intracranial hemorrhage with hemiplegia and hemiparesis Patient was found to have a right basal ganglia intracranial hemorrhage.  Thought secondary to uncontrolled hypertension for which patient was temporarily on Cleviprex to keep systolic blood pressures less than 160.  Weaned off transitioning to oral medications. -Per PM&R  SIRS/sepsis Possible cholecystitis Acute.  Patient was found to be febrile to with tachycardia and tachypnea meeting SIRS criteria.  Urinalysis did not give significant concern for infection.  Patient had complained of right upper quadrant pain for which CT scan of the abdomen and pelvis concerning for distended gallbladder.  Blood cultures had been obtained and patient was started on empiric antibiotics of vancomycin and Zosyn. -Follow-up blood cultures -Follow-up right upper quadrant ultrasound -Continue empiric antibiotics of vancomycin and Zosyn.  De-escalate medically appropriate  Hypertensive urgency Blood pressures elevated up to 194/112 this morning.  -Continue medication regimen of metoprolol, Cozaar, hydralazine -Continue prn IV medications   AKI Resolving.  Patient was noted to have creatinine elevated up to 1.85 with BUN 26 yesterday.  Urinalysis did not note any significant signs of infection.  She had been started on IV fluids with improvement in creatinine. Last creatinine 1.2 which is improved with IVFs.  Creatinine at baseline noted to be around 1. -Continue IV fluids -Avoid nephrotoxic agents -Repeat creatinine in a.m.  Hyponatremia Acute on chronic.  Patient had been started on IV fluids on 1/18 with sodium levels going from 130->128 on repeat check today.  Given  recent intracranial hemorrhage question the possibility of SIADH.  Initially questioned if symptoms were secondary to hypovolemic hyponatremia given AKI. -Strict I&Os -Check urine sodium, urine osmolarity(, and serum osmolarity for further evaluation -Check TSH and a.m. cortisol -Further testing such as possible need of ACTH stimulation test to be reassess and determine when reassessed in a.m.  Transaminitis Acute.  Patient noted to have elevated alkaline phosphatase 356, AST 190, ALT 361, and total bilirubin within normal limits.  LFTs previously had been within normal limits on 1/10.  Question if related to CT findings concerning for possible cholecystitis, statin/other medication recently started, versus other. -Trend LFTs and check hepatitis panel  Headache Patient continues to complain of headache.  Repeat CT scan today noted slightly decreased size and density of the right thalamic hemorrhage without acute finding. -Continue oxycodone  Hyperlipidemia -Hold Crestor with recent elevated liver enzymes  Pulmonary nodule Incidental finding noted on CT scan of the left lower lobe of the lung measuring 5 mm.  TRH will continue to follow the patient.  HPI: Kelly Stephenson is a 52 y.o. female with past medical history of hypertension, but was not  taking any medications prior to arrival.  She presented to [facility] hospital from work by EMS on 1/5 after developing acute difficulty speaking with left-sided weakness.  Revealed a right basal ganglia intracranial hemorrhage for which she was admitted by neurology to the ICU on a Cleviprex drip.  Intracranial hemorrhage was thought secondary to uncontrolled hypertension and oral medications were started with better control.  No surgical intervention was required.  She had been transferred to rehab on 1/9.  Since being in rehab patient has been complaining of headache with insomnia and reports of intermittent delirium.  Repeat CT scans  of the head acute  changes.  Labs had noted down trending sodium levels with her creatinine elevated up to 1.85 with BUN 26 on 1/18.  Since yesterday patient had also been noted to have elevated blood pressures.  TRH consulted to evaluate due to the concern for DI.  Initial labs from this morning noted sodium stable at 130 with BUN 27, creatinine 1.49 after starting IV fluids.  Today patient had been noted to be febrile up to 102.3 F with tachycardia and tachypnea meeting SIRS criteria. She had also complained of right upper quadrant abdominal pain and was reported to have episode of diarrhea..  KUB yesterday had noted gaseous distention of the colon but repeat check today noted normal gas pattern.  Chest x-ray noted no acute abnormality.  Urinalysis and significant signs of infection.  CMP was obtained this afternoon which noted sodium down to 128 with elevated liver enzymes CT scan of the abdomen and pelvis had noted a distended gallbladder with mild wall thickening for which a right upper quadrant ultrasound has been ordered.  Blood cultures were obtained and patient was started on empiric antibiotics of vancomycin and Zosyn.  Review of Systems: As mentioned in the history of present illness. All other systems reviewed and are negative. Past Medical History:  Diagnosis Date   Anxiety    Chronic right shoulder pain    Gastrocnemius strain, left    did outpatient Tx w/ improvement   Hypertension    Insomnia    Past Surgical History:  Procedure Laterality Date   ABDOMINAL HYSTERECTOMY     CESAREAN SECTION     Social History:  reports that she has never smoked. She has never used smokeless tobacco. She reports current alcohol use of about 1.0 standard drink of alcohol per week. She reports that she does not use drugs.  No Known Allergies  Family History  Problem Relation Age of Onset   High blood pressure Mother    Osteoporosis Mother    High blood pressure Father    Heart Problems Father    Diabetes Father     Hypertension Father    Stroke Father    Heart attack Father    High blood pressure Sister    Depression Sister    Diabetes Sister    Hypertension Sister    High blood pressure Brother    Stroke Maternal Grandfather    Hypertension Maternal Aunt    Obesity Maternal Aunt    Hypertension Maternal Uncle    Kidney disease Maternal Uncle    Glaucoma Maternal Uncle    Stroke Paternal Uncle    Stroke Other     Prior to Admission medications   Medication Sig Start Date End Date Taking? Authorizing Provider  acetaminophen (TYLENOL) 325 MG tablet Take 2 tablets (650 mg total) by mouth every 4 (four) hours as needed for mild pain. 09/27/22   Love, Evlyn Kanner, PA-C  amLODipine (NORVASC) 10 MG tablet Take 1 tablet (10 mg total) by mouth daily. 09/21/22   Meredeth Ide, MD  hydrALAZINE (APRESOLINE) 100 MG tablet Take 1 tablet (100 mg total) by mouth every 8 (eight) hours. 09/20/22   Meredeth Ide, MD  hydrALAZINE (APRESOLINE) 20 MG/ML injection Inject 0.5 mLs (10 mg total) into the vein every 4 (four) hours as needed (sbp greater than 150 despite labetolol PRN). 09/20/22   Meredeth Ide, MD  labetalol (NORMODYNE) 5 MG/ML injection Inject 2-4 mLs (10-20 mg total) into the vein every 2 (two)  hours as needed (SBP >150). 09/20/22   Oswald Hillock, MD  loratadine (CLARITIN) 10 MG tablet Take 1 tablet (10 mg total) by mouth daily. 09/28/22   Love, Ivan Anchors, PA-C  losartan (COZAAR) 100 MG tablet Take 1 tablet (100 mg total) by mouth daily. 09/21/22   Oswald Hillock, MD  metoprolol tartrate (LOPRESSOR) 50 MG tablet Take 1 tablet (50 mg total) by mouth 2 (two) times daily. 09/20/22   Oswald Hillock, MD  oxyCODONE (OXY IR/ROXICODONE) 5 MG immediate release tablet Take 1 tablet (5 mg total) by mouth every 6 (six) hours as needed for severe pain. 09/20/22   Oswald Hillock, MD  rosuvastatin (CRESTOR) 40 MG tablet Take 1 tablet (40 mg total) by mouth daily. 09/21/22   Oswald Hillock, MD    Physical Exam: Vitals:   09/29/22 2359  09/30/22 0009 09/30/22 0431 09/30/22 1044  BP: (!) 170/108 (!) 176/100 (!) 170/90 (!) 194/112  Pulse: (!) 110  98 (!) 124  Resp:   18 20  Temp:   97.8 F (36.6 C) 98.6 F (37 C)  TempSrc:   Oral   SpO2:   98% 97%  Weight:      Height:       Exam  Constitutional: Obese middle-aged female currently peers to be in NAD, calm, comfortable Eyes: Eyes closed. ENMT: Mucous membranes are moist. Posterior pharynx clear of any exudate or lesions.Normal dentition.  Neck: normal, supple, no masses, no thyromegaly Respiratory: clear to auscultation bilaterally, no wheezing, no crackles. Normal respiratory effort. No accessory muscle use.  Cardiovascular: Regular rate and rhythm, no murmurs / rubs / gallops. No extremity edema. 2+ pedal pulses. No carotid bruits.  Abdomen: Tenderness palpation of the right upper quadrant of the abdomen.  Bowel sounds present all 4 quadrants. Musculoskeletal: no clubbing / cyanosis. Good ROM, no contractures. Normal muscle tone.  Skin: Psoriatic lesions appreciated Neurologic: CN 2-12 grossly intact.  Left-sided hemiplegia Psychiatric: Normal judgment and insight. Alert and oriented x 3. Normal mood.   Data Reviewed:   reviewed labs, imaging, and pertinent records as noted above   Family Communication: Husband updated at bedside Primary team communication:  Thank you very much for involving [place] in the care of your patient.  Author: Norval Morton, MD 09/30/2022 1:00 PM  For on call review [email or web address].

## 2022-09-30 NOTE — Progress Notes (Addendum)
SLP Cancellation Note  Patient Details Name: Kelly Stephenson MRN: [id number] DOB: 12/03/1970   Cancelled treatment:       SLP attempted to see pt for scheduled ST intervention this afternoon (60 minutes); however, pt unable to participate - with active emesis upon arrival. Therefore, missed all scheduled time. SLP will continue efforts to make up time. Pt is now QD due to medical status and inability to participate in 15/7 therapy schedule.  Batina Dougan A Moani Weipert 09/30/2022, 1:23 PM

## 2022-09-30 NOTE — Progress Notes (Signed)
Patient has had significant HA, continues to have worsening of GI symptoms but KUB today shows colonic distension resolved. She did have diarrhea this am per mother and reporting abdominal pain as well as severe HA. Has been unable to keep any po's. Recheck of vitals showed patient to be febrile--sepsis work up ordered. CT adomen pelvis and CT head ordered. Discussed with Dr. Adam Phenix. [facility] consulted for input on AKI and I, hyponatremia and also management.

## 2022-09-30 NOTE — Progress Notes (Addendum)
PROGRESS NOTE   Subjective/Complaints: Nauseous this morning: IV zofran ordered Feeling thirsty throughout the night as per the husband. Discussed elevated creatinine yesterday  ROS: +left wrist pain , +headache, +fatigue, +emesis  Objective:   DG Abd 1 View  Result Date: 09/29/2022 CLINICAL DATA:  [id number] Emesis [id number] EXAM: ABDOMEN - 1 VIEW COMPARISON:  None Available. FINDINGS: Gaseous distension of colon. No dilated loops of small bowel are visualized. Air is visualized in the rectum. Pelvic phleboliths. IMPRESSION: Nonobstructive bowel gas pattern. If persistent concern for obstructive physiology, recommend dedicated CT abdomen pelvis with contrast. Electronically Signed   By: Valentino Saxon M.D.   On: 09/29/2022 16:51   CT HEAD WO CONTRAST (5MM)  Result Date: 09/29/2022 CLINICAL DATA:  Worsening headache EXAM: CT HEAD WITHOUT CONTRAST TECHNIQUE: Contiguous axial images were obtained from the base of the skull through the vertex without intravenous contrast. RADIATION DOSE REDUCTION: This exam was performed according to the departmental dose-optimization program which includes automated exposure control, adjustment of the mA and/or kV according to patient size and/or use of iterative reconstruction technique. 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 Likely unchanged surrounding edema and mild regional mass effect. No acute infarct, new hemorrhage, mass, or midline shift. No hydrocephalus or extra-axial collection. Vascular: No hyperdense vessel. Skull: Negative for fracture or focal lesion. Sinuses/Orbits: Clear paranasal sinuses. No acute finding in the orbits. Other: The mastoids are well aerated. IMPRESSION: 1. Expected evolution of previously noted right thalamic hemorrhage, decreased in size and  density, with largely unchanged mild associated edema and mass effect. 2. No acute intracranial process. Electronically Signed   By: Merilyn Baba M.D.   On: 09/29/2022 01:43   No results for input(s): "WBC", "HGB", "HCT", "PLT" in the last 72 hours.  Recent Labs    09/29/22 1059 09/30/22 0634  NA 130* 130*  K 3.5 3.3*  CL 99 100  CO2 21* 18*  GLUCOSE 134* 114*  BUN 26* 27*  CREATININE 1.85* 1.49*  CALCIUM 9.0 8.7*     Intake/Output Summary (Last 24 hours) at 09/30/2022 1034 Last data filed at 09/30/2022 0920 Gross per 24 hour  Intake 1075.8 ml  Output --  Net 1075.8 ml        Physical Exam: Vital Signs Blood pressure (!) 170/90, pulse 98, temperature 97.8 F (36.6 C), temperature source Oral, resp. rate 18, height 5\' 4"  (1.626 m), weight 87.8 kg, SpO2 98 %. Constitutional: No distress . Vital signs reviewed. BMI 33.23, fatigued HEENT: NCAT, EOMI, oral membranes moist Neck: supple Cardiovascular: RRR without murmur. No JVD    Respiratory/Chest: CTA Bilaterally without wheezes or rales. Normal effort    GI/Abdomen: BS +, non-tender, non-distended Ext: no clubbing, cyanosis, or edema Psych: flat, does awaken briefly and is cooperative. Skin: No evidence of breakdown, no evidence of rash Neurologic: slow to arouse. Follows commands.  Right sided motor strength is grossly 5/5,    LUE 3- at deltoid , bi, tri, grip, 3- Left HF, KE, 2- ADF  Sensory exam - pt does not attend consistently to participate Left lower back  TTP     Assessment/Plan: 1. Functional deficits which require 3+ hours per day of interdisciplinary therapy in a comprehensive inpatient rehab setting. Physiatrist is providing close team supervision and 24 hour management of active medical problems listed below. Physiatrist and rehab team continue to assess barriers to discharge/monitor patient progress toward functional and medical goals  Care Tool:  Bathing    Body parts bathed by patient: Left arm,  Chest, Abdomen, Front perineal area, Buttocks, Right upper leg, Left upper leg, Right lower leg, Face   Body parts bathed by helper: Left lower leg, Right arm     Bathing assist Assist Level: Minimal Assistance - Patient > 75%     Upper Body Dressing/Undressing Upper body dressing   What is the patient wearing?: Pull over shirt    Upper body assist Assist Level: Set up assist    Lower Body Dressing/Undressing Lower body dressing      What is the patient wearing?: Pants, Incontinence brief     Lower body assist Assist for lower body dressing: Moderate Assistance - Patient 50 - 74%     Toileting Toileting    Toileting assist Assist for toileting: Minimal Assistance - Patient > 75%     Transfers Chair/bed transfer  Transfers assist     Chair/bed transfer assist level: Minimal Assistance - Patient > 75%     Locomotion Ambulation   Ambulation assist      Assist level: Moderate Assistance - Patient 50 - 74% Assistive device: Parallel bars Max distance: 8 ft   Walk 10 feet activity   Assist  Walk 10 feet activity did not occur: Safety/medical concerns        Walk 50 feet activity   Assist Walk 50 feet with 2 turns activity did not occur: Safety/medical concerns         Walk 150 feet activity   Assist Walk 150 feet activity did not occur: Safety/medical concerns         Walk 10 feet on uneven surface  activity   Assist Walk 10 feet on uneven surfaces activity did not occur: Safety/medical concerns         Wheelchair     Assist Is the patient using a wheelchair?: Yes Type of Wheelchair: Manual    Wheelchair assist level: Dependent - Patient 0%      Wheelchair 50 feet with 2 turns activity    Assist        Assist Level: Dependent - Patient 0%   Wheelchair 150 feet activity     Assist      Assist Level: Dependent - Patient 0%   Blood pressure (!) 170/90, pulse 98, temperature 97.8 F (36.6 C), temperature  source Oral, resp. rate 18, height 5\' 4"  (1.626 m), weight 87.8 kg, SpO2 98 %.    Medical Problem List and Plan: 1. 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  Antithrombotics: -DVT/anticoagulation:  Pharmaceutical: Lovenox             -antiplatelet therapy: N/A 3. Headache: Scheduled topamax 100mg  HS. On oxycodone and Fioricet prn.              --  will start sleep chart.               4. Mood/Behavior/Sleep: LCSW to follow for evaluation and support.  --Has a lot of stress, anxiety, insomnia-->hasn't wanted to get help per husband.  --chronic insomnia-->up/down multiple times during the night. --antipsychotic agents: N/A 5. Neuropsych/cognition: This patient is capable of making decisions on her own behalf. 6. Skin/Wound Care: Routine pressure relief measures.  7. Fluids/Electrolytes/Nutrition: encourage PO -BUN still elevated 26 but trending down from 1/12 lab (30). 8. HTN: Monitor BP TID. Was not compliant with medications--does not want meds/doesn't like meds, so was noncompliant at home with meds prescribed.  -d/c amlodipine, increase magnesium to 500mg  HS --On Norvasc, Cozaar, Hydralazine and Lopressor.             --SBP goal 130-150 range.   -ordered IV hydralazine 10mg  for SBP >160 9. Hyperlipidemia: LDL 206- on Crestor. 10. Hyponatremia: Question SIADH. Recheck sodium today.  11. Leucocytosis: Likely reactive--still sl elevated  -afebrile --No signs of infection.  12. Acute on chronic Headaches w/ nausea: Took 4 aleve daily.  --Will add Claritin and nose spray to help with congestion as could be exacerbating HA --Increased topamax to 100 mg HS. Added magnesium gluconate 250mg  HS 13. B12 deficiency: given IM B12 given 1/11, discussed that she may  benefit from these monthly, or increasing consumption of meat.  14. Left wrist pain: discussed that XR results are negative 15. AKI: Cr elevated, IVF started.  16. Pseudobulbar affect: Nudexta discontinued since patient started feeling nauseous after it was given 17. Insomnia: d/c trazodone, decrease topamax to 50mg , add melatonin 3mg  HS 18. Hypokalemia: supplement klor 40 19. Daytime somnolence: d/c klonopin and daytime topamax 20. Hyponatremia: stable at 130, repeat tomorrow 21. Nausea: IV zofran ordered prn 22. Sepsis: IV zosyn and vanc started 1/19. Fever resolved. CXR reviewed and stable. Lactic acid normal. abdomen reviewed and is unremarkable. F/u blood and urine cultures   LOS: 10 days A FACE TO FACE EVALUATION WAS PERFORMED  3/11 Sandria Mcenroe 09/30/2022, 10:34 AM

## 2022-10-01 DIAGNOSIS — I160 Hypertensive urgency: Secondary | ICD-10-CM

## 2022-10-01 DIAGNOSIS — A419 Sepsis, unspecified organism: Secondary | ICD-10-CM

## 2022-10-01 DIAGNOSIS — R7401 Elevation of levels of liver transaminase levels: Secondary | ICD-10-CM

## 2022-10-01 DIAGNOSIS — R519 Headache, unspecified: Secondary | ICD-10-CM

## 2022-10-01 DIAGNOSIS — R109 Unspecified abdominal pain: Secondary | ICD-10-CM

## 2022-10-01 DIAGNOSIS — R197 Diarrhea, unspecified: Secondary | ICD-10-CM

## 2022-10-01 DIAGNOSIS — N179 Acute kidney failure, unspecified: Secondary | ICD-10-CM | POA: Diagnosis present

## 2022-10-01 DIAGNOSIS — E871 Hypo-osmolality and hyponatremia: Secondary | ICD-10-CM | POA: Diagnosis present

## 2022-10-01 HISTORY — DX: Hypertensive urgency: I16.0

## 2022-10-01 HISTORY — DX: Sepsis, unspecified organism: A41.9

## 2022-10-01 HISTORY — DX: Unspecified abdominal pain: R10.9

## 2022-10-01 HISTORY — DX: Diarrhea, unspecified: R19.7

## 2022-10-01 LAB — BASIC METABOLIC PANEL
Anion gap: 10 (ref 5–15)
BUN: 13 mg/dL (ref 6–20)
CO2: 19 mmol/L — ABNORMAL LOW (ref 22–32)
Calcium: 8.5 mg/dL — ABNORMAL LOW (ref 8.9–10.3)
Chloride: 103 mmol/L (ref 98–111)
Creatinine, Ser: 0.87 mg/dL (ref 0.44–1.00)
GFR, Estimated: >60 mL/min (ref >60)
Glucose, Bld: 116 mg/dL — ABNORMAL HIGH (ref 70–99)
Potassium: 3.2 mmol/L — ABNORMAL LOW (ref 3.5–5.1)
Sodium: 132 mmol/L — ABNORMAL LOW (ref 135–145)

## 2022-10-01 LAB — CBC WITH DIFFERENTIAL/PLATELET
Abs Immature Granulocytes: 0.06 10*3/uL (ref 0.00–0.07)
Basophils Absolute: 0 10*3/uL (ref 0.0–0.1)
Basophils Relative: 1 %
Eosinophils Absolute: 0.4 10*3/uL (ref 0.0–0.5)
Eosinophils Relative: 4 %
HCT: 36.4 % (ref 36.0–46.0)
Hemoglobin: 12.5 g/dL (ref 12.0–15.0)
Immature Granulocytes: 1 %
Lymphocytes Relative: 9 %
Lymphs Abs: 0.7 10*3/uL (ref 0.7–4.0)
MCH: 28.2 pg (ref 26.0–34.0)
MCHC: 34.3 g/dL (ref 30.0–36.0)
MCV: 82.2 fL (ref 80.0–100.0)
Monocytes Absolute: 0.5 10*3/uL (ref 0.1–1.0)
Monocytes Relative: 6 %
Neutro Abs: 6.4 10*3/uL (ref 1.7–7.7)
Neutrophils Relative %: 79 %
Platelets: 248 10*3/uL (ref 150–400)
RBC: 4.43 MIL/uL (ref 3.87–5.11)
RDW: 12.7 % (ref 11.5–15.5)
WBC: 8 10*3/uL (ref 4.0–10.5)
nRBC: 0 % (ref 0.0–0.2)

## 2022-10-01 LAB — HEPATITIS PANEL, ACUTE
HCV Ab: NONREACTIVE
Hep A IgM: NONREACTIVE
Hep B C IgM: NONREACTIVE
Hepatitis B Surface Ag: NONREACTIVE

## 2022-10-01 LAB — CORTISOL-AM, BLOOD: Cortisol - AM: 26.2 ug/dL — ABNORMAL HIGH (ref 6.7–22.6)

## 2022-10-01 LAB — HEPATIC FUNCTION PANEL
ALT: 306 U/L — ABNORMAL HIGH (ref 0–44)
AST: 129 U/L — ABNORMAL HIGH (ref 15–41)
Albumin: 2.9 g/dL — ABNORMAL LOW (ref 3.5–5.0)
Alkaline Phosphatase: 306 U/L — ABNORMAL HIGH (ref 38–126)
Bilirubin, Direct: 0.2 mg/dL (ref 0.0–0.2)
Indirect Bilirubin: 0 mg/dL — ABNORMAL LOW (ref 0.3–0.9)
Total Bilirubin: 0.2 mg/dL — ABNORMAL LOW (ref 0.3–1.2)
Total Protein: 5.9 g/dL — ABNORMAL LOW (ref 6.5–8.1)

## 2022-10-01 LAB — SODIUM, URINE, RANDOM: Sodium, Ur: 57 mmol/L

## 2022-10-01 LAB — TSH: TSH: 3.439 u[IU]/mL (ref 0.350–4.500)

## 2022-10-01 LAB — PROCALCITONIN: Procalcitonin: 1.11 ng/mL

## 2022-10-01 LAB — URINE CULTURE: Culture: NO GROWTH

## 2022-10-01 MED ORDER — VANCOMYCIN HCL 750 MG/150ML IV SOLN
750.0000 mg | INTRAVENOUS | Status: DC
  Filled 2022-10-01: qty 150

## 2022-10-01 MED ORDER — PIPERACILLIN-TAZOBACTAM 3.375 G IVPB: 3.3750 g | Freq: Three times a day (TID) | INTRAVENOUS | Status: DC

## 2022-10-01 MED ORDER — HYDRALAZINE HCL 20 MG/ML IJ SOLN: 10.0000 mg | INTRAMUSCULAR | Status: DC | PRN

## 2022-10-01 MED ORDER — POTASSIUM CHLORIDE CRYS ER 20 MEQ PO TBCR
40.0000 meq | EXTENDED_RELEASE_TABLET | Freq: Two times a day (BID) | ORAL | Status: DC
  Administered 2022-10-01: 40 meq via ORAL
  Filled 2022-10-01 (×2): qty 2

## 2022-10-01 NOTE — Consult Note (Addendum)
NGOC DETJEN  UMP:[id number] DOB: [birth year] DOA: 09/20/2022 PCP: Birdie Sons, MD    Brief Narrative:  52 year old with a history of HTN but not on medications prior to admission who was transported to [facility] ER from work 09/16/2022 with the acute onset of difficulty speaking and left-sided weakness.  She was found to have a right basal ganglia ICH and ultimately required admission to the stroke team.  She was treated with a Cleviprex drip.  No surgical intervention was required.  Her bleeding was felt to have been hypertensive in nature.  She was ultimately transferred to inpatient rehab 09/20/2022.  While in rehab she has had intermittent complaints of headache, insomnia, and delirium.  Follow-up CT head has been without acute findings.  She has experienced gradually progressive decrease in sodium levels and increasing creatinine.  TRH was consulted to assist with evaluation of hyponatremia and acute kidney injury.  Goals of Care:  Code Status: Full Code   DVT prophylaxis: Per primary team (Rehab)  Interim Hx: The patient has been afebrile since early yesterday afternoon.  Blood pressure control improved.  WBC not elevated.  Overall she tells me she feels much better, other than being very tired and having persisting difficulty with insomnia/sleep deprivation.  She denies any abdominal complaints today.  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 Titrate medical therapy as required to keep systolic blood pressure <400  Malignant hypertension Continue to titrate antihypertensives as needed  Question of sepsis - no clinical evidence to support true sepsis  On 09/30/2022 patient was febrile with tachycardia and tachypnea -urinalysis was unrevealing -she complained of right upper quadrant pain for which CT scan of the abdomen and pelvis suggested a distended gallbladder -she was placed on empiric antibiotics -right upper quadrant ultrasound unrevealing  -clinically today I find no evidence convincing of an active infection -discontinue antibiotics and monitor for now as we have no idea what we would be treating if we continued antibiotic presently and would be exposing her to the risk of C. difficile colitis, yeast infection, and allergic reaction unnecessarily  Acute kidney injury Appears to have been simply related to volume depletion - creatinine peaked at 1.5 - baseline creatinine approximately 1.0 -follow trend intermittently  Hyponatremia Most likely simply related to hypovolemia, but in setting of recent ICH could be a more complex picture - while osmolality and spot urine Na studies suggestive of possible SIADH picture, her Na has improved w/ volume expansion - follow for now w/o change in tx   Transaminitis Etiology unclear -GI has been consulted by the primary service -will defer management to GI -hepatitis panel pending  Rash The patient has a diffuse lacy erythematous rash across her trunk and arms which is consistent with a drug rash, likely related to the antibiotic being used -discontinue antibiotic as discussed above and monitor -no evidence of more severe anaphylactic type reaction at present  Generalized headache Likely being driven by elevated blood pressure as well as recent ICH -follow-up CT head without acute findings  HLD Hold statin in setting of elevated LFTs  Incidentally noted 5 mm pulmonary nodule No follow-up needed as patient is low risk per radiology guidelines  Family Communication: Spoke with significant other at bedside  Objective: Blood pressure 116/75, pulse 80, temperature 98.4 F (36.9 C), resp. rate 19, height 5\' 4"  (1.626 m), weight 87.8 kg, SpO2 97 %.  Intake/Output Summary (Last 24 hours) at 10/01/2022 1557 Last data filed at 10/01/2022  0830 Gross per 24 hour  Intake 2314.44 ml  Output --  Net 2314.44 ml   Filed Weights   09/20/22 1443  Weight: 87.8 kg    Examination: General: No  acute respiratory distress Lungs: Clear to auscultation bilaterally -no focal crackles Cardiovascular: Regular rate and rhythm without murmur  Abdomen: Nontender, nondistended, soft, bowel sounds positive, no rebound Extremities: No significant edema bilateral lower extremities  CBC: Recent Labs  Lab 09/26/22 0633 09/30/22 1303 10/01/22 0959  WBC 11.8* 10.2 8.0  NEUTROABS  --  9.0* 6.4  HGB 13.8 13.7 12.5  HCT 41.6 37.6 36.4  MCV 84.4 79.2* 82.2  PLT 343 236 350   Basic Metabolic Panel: Recent Labs  Lab 09/29/22 1059 09/30/22 0634 09/30/22 1440 10/01/22 0959  NA 130* 130* 128* 132*  K 3.5 3.3* 3.1* 3.2*  CL 99 100 98 103  CO2 21* 18* 19* 19*  GLUCOSE 134* 114* 117* 116*  BUN 26* 27* 20 13  CREATININE 1.85* 1.49* 1.20* 0.87  CALCIUM 9.0 8.7* 8.6* 8.5*  MG 2.1  --   --   --    GFR: Estimated Creatinine Clearance: 82 mL/min (by C-G formula based on SCr of 0.87 mg/dL).   Scheduled Meds:  enoxaparin (LOVENOX) injection  40 mg Subcutaneous Q24H   fluticasone  1 spray Each Nare Daily   folic acid-pyridoxine-cyancobalamin  1 tablet Oral Daily   hydrALAZINE  100 mg Oral Q8H   loratadine  10 mg Oral Daily   losartan  100 mg Oral Daily   magnesium gluconate  500 mg Oral QHS   metoprolol tartrate  50 mg Oral BID   pantoprazole  40 mg Oral Daily   potassium chloride  40 mEq Oral Once   potassium chloride  40 mEq Oral BID   senna-docusate  2 tablet Oral Q supper   sertraline  50 mg Oral Daily   topiramate  50 mg Oral QHS   triamcinolone 0.1 % cream : eucerin   Topical TID   Continuous Infusions:  sodium chloride 100 mL/hr at 10/01/22 1505   piperacillin-tazobactam (ZOSYN)  IV       LOS: 11 days   Cherene Altes, MD [facility] Office  [phone] Pager - Text Page per Shea Evans  If 7PM-7AM, please contact night-coverage per Amion 10/01/2022, 3:57 PM

## 2022-10-01 NOTE — Plan of Care (Signed)
  Problem: Consults Goal: RH STROKE PATIENT EDUCATION Description: See Patient Education module for education specifics  Outcome: Progressing   Problem: RH BOWEL ELIMINATION Goal: RH STG MANAGE BOWEL WITH ASSISTANCE Description: STG Manage Bowel with mod I Assistance. Outcome: Progressing Goal: RH STG MANAGE BOWEL W/MEDICATION W/ASSISTANCE Description: STG Manage Bowel with Medication with mod I Assistance. Outcome: Progressing   Problem: RH SAFETY Goal: RH STG ADHERE TO SAFETY PRECAUTIONS W/ASSISTANCE/DEVICE Description: STG Adhere to Safety Precautions With cues Assistance/Device. Outcome: Progressing   Problem: RH PAIN MANAGEMENT Goal: RH STG PAIN MANAGED AT OR BELOW PT'S PAIN GOAL Description: < 4 with prns Outcome: Not Progressing Note: Patient continues with constant headaches unless patient is asleep.   Problem: RH KNOWLEDGE DEFICIT Goal: RH STG INCREASE KNOWLEDGE OF HYPERTENSION Description: Patient and spouse will be able to manage HTN with medications and dietary modifications using educational resources independently Outcome: Progressing Goal: RH STG INCREASE KNOWLEGDE OF HYPERLIPIDEMIA Description: Patient and spouse will be able to manage HLD with medications and dietary modifications using educational resources independently Outcome: Progressing   Problem: Education: Goal: Knowledge of disease or condition will improve Outcome: Progressing Goal: Knowledge of secondary prevention will improve (MUST DOCUMENT ALL) Outcome: Progressing Goal: Knowledge of patient specific risk factors will improve Elta [place] N/A or DELETE if not current risk factor) Outcome: Progressing   Problem: Intracerebral Hemorrhage Tissue Perfusion: Goal: Complications of Intracerebral Hemorrhage will be minimized Outcome: Progressing   Problem: Coping: Goal: Will verbalize positive feelings about self Outcome: Progressing Goal: Will identify appropriate support needs Outcome: Progressing    Problem: Health Behavior/Discharge Planning: Goal: Ability to manage health-related needs will improve Outcome: Progressing Goal: Goals will be collaboratively established with patient/family Outcome: Progressing   Problem: Self-Care: Goal: Ability to participate in self-care as condition permits will improve Outcome: Progressing Goal: Verbalization of feelings and concerns over difficulty with self-care will improve Outcome: Progressing Goal: Ability to communicate needs accurately will improve Outcome: Progressing   Problem: Nutrition: Goal: Risk of aspiration will decrease Outcome: Progressing Goal: Dietary intake will improve Outcome: Progressing

## 2022-10-01 NOTE — Progress Notes (Signed)
Pt declined HS meds including Topamax and Metoprolol.  Discussed with Dr. Dagoberto Ligas at 2230 and per her request I tried to get pt to take the Topamax but she still declined.  She says she isn't have headaches "like they used to be" and that her nausea is decreased but she still has queasy feelings if she thinks about taking any pills.  She has been able to drink cranberry juice during the night.  PRN Metoprolol IV given x 1, but BP was elevated at last check.  Pt did consent to take her morning Hydralazine with cranberry juice and has kept it down thus far. Ayesha Mohair BSN RN [place] 10/01/2022, 5:10 AM

## 2022-10-01 NOTE — Progress Notes (Addendum)
PROGRESS NOTE   Subjective/Complaints: Still nauseated- but less so than yesterday.   Getting labs at 10am.  Less N/V- less abd tenderness.  Last vomiting last night.     ROS: limited by  Objective:   US Abdomen Limited RUQ (LIVER/GB)  Result Date: 09/30/2022 CLINICAL DATA:  Mildly and vomiting. EXAM: ULTRASOUND ABDOMEN LIMITED RIGHT UPPER QUADRANT COMPARISON:  CT dated 09/30/2022. FINDINGS: Gallbladder: No gallstones or wall thickening visualized. No sonographic Murphy sign noted by sonographer. Common bile duct: Diameter: 2 mm Liver: The liver is unremarkable. Portal vein is patent on color Doppler imaging with normal direction of blood flow towards the liver. Other: None. IMPRESSION: Unremarkable right upper quadrant ultrasound. Electronically Signed   By: Anner Crete M.D.   On: 09/30/2022 20:27   DG Chest 2 View  Result Date: 09/30/2022 CLINICAL DATA:  Fever EXAM: CHEST - 2 VIEW COMPARISON:  None Available. FINDINGS: Unchanged cardiomediastinal silhouette. There is no focal airspace consolidation. There is no pleural effusion or evidence of pneumothorax. There is no acute osseous abnormality. Globular calcifications overlies the right acromiohumeral interval IMPRESSION: No evidence of acute cardiopulmonary disease. Calcific tendinosis of the distal right rotator cuff. Electronically Signed   By: Maurine Simmering M.D.   On: 09/30/2022 16:09   CT ABDOMEN PELVIS WO CONTRAST  Result Date: 09/30/2022 CLINICAL DATA:  Acute abdominal pain EXAM: CT ABDOMEN AND PELVIS WITHOUT CONTRAST TECHNIQUE: Multidetector CT imaging of the abdomen and pelvis was performed following the standard protocol without IV contrast. RADIATION DOSE REDUCTION: This exam was performed according to the departmental dose-optimization program which includes automated exposure control, adjustment of the mA and/or kV according to patient size and/or use of iterative  reconstruction technique. COMPARISON:  None Available. FINDINGS: Lower chest: Solid pulmonary nodule of the left lower lobe measuring 5 mm on series 5, image 7 Hepatobiliary: No focal liver abnormality. Gallbladder is distended with possible mild wall thickening, lack of IV contrast limits evaluation. No biliary ductal dilation. Pancreas: Unremarkable. No pancreatic ductal dilatation or surrounding inflammatory changes. Spleen: Normal in size without focal abnormality. Adrenals/Urinary Tract: Bilateral adrenal glands are unremarkable. No hydronephrosis or nephrolithiasis. Bladder is unremarkable. Stomach/Bowel: Stomach is within normal limits. Appendix appears normal. No evidence of bowel wall thickening, distention, or inflammatory changes. Vascular/Lymphatic: No significant vascular findings are present. No enlarged abdominal or pelvic lymph nodes. Reproductive: No adnexal masses. Other: No abdominal wall hernia or abnormality. No abdominopelvic ascites. Musculoskeletal: No acute or significant osseous findings. IMPRESSION: 1. Gallbladder is distended with possible mild wall thickening, lack of IV contrast limits evaluation. Correlate for symptoms of right upper quadrant pain and consider gallbladder ultrasound for further evaluation. 2. Solid pulmonary nodule of the left lower lobe measuring 5 mm. No follow-up needed if patient is low-risk.This recommendation follows the consensus statement: Guidelines for Management of Incidental Pulmonary Nodules Detected on CT Images: From the Fleischner Society 2017; Radiology 2017; 284:228-243. Electronically Signed   By: Yetta Glassman M.D.   On: 09/30/2022 15:49   CT HEAD WO CONTRAST (5MM)  Result Date: 09/30/2022 CLINICAL DATA:  Hemorrhagic stroke follow-up. EXAM: CT HEAD WITHOUT CONTRAST TECHNIQUE: Contiguous axial images were obtained from the base of the  skull through the vertex without intravenous contrast. RADIATION DOSE REDUCTION: This exam was performed  according to the departmental dose-optimization program which includes automated exposure control, adjustment of the mA and/or kV according to patient size and/or use of iterative reconstruction technique. COMPARISON:  CT head dated September 28, 2022. FINDINGS: Brain: Right thalamic hemorrhage has slightly decreased in size and density since the prior study. Surrounding edema is similar. No new infarct, hydrocephalus, extra-axial collection or mass lesion. No midline shift. Vascular: No hyperdense vessel or unexpected calcification. Skull: Normal. Negative for fracture or focal lesion. Sinuses/Orbits: No acute finding. Other: None. IMPRESSION: 1. Slightly decreased size and density of the right thalamic hemorrhage. No new acute finding. Electronically Signed   By: Titus Dubin M.D.   On: 09/30/2022 15:42   DG Abd 1 View  Result Date: 09/30/2022 CLINICAL DATA:  Nausea vomiting EXAM: ABDOMEN - 1 VIEW COMPARISON:  09/29/2022 FINDINGS: The bowel gas pattern is normal. No radio-opaque calculi or other significant radiographic abnormality are seen. IMPRESSION: Negative. Electronically Signed   By: Sammie Bench M.D.   On: 09/30/2022 12:59   DG Abd 1 View  Result Date: 09/29/2022 CLINICAL DATA:  [id number] Emesis [id number] EXAM: ABDOMEN - 1 VIEW COMPARISON:  None Available. FINDINGS: Gaseous distension of colon. No dilated loops of small bowel are visualized. Air is visualized in the rectum. Pelvic phleboliths. IMPRESSION: Nonobstructive bowel gas pattern. If persistent concern for obstructive physiology, recommend dedicated CT abdomen pelvis with contrast. Electronically Signed   By: Valentino Saxon M.D.   On: 09/29/2022 16:51   Recent Labs    09/30/22 1303 10/01/22 0959  WBC 10.2 8.0  HGB 13.7 12.5  HCT 37.6 36.4  PLT 236 248    Recent Labs    09/30/22 1440 10/01/22 0959  NA 128* 132*  K 3.1* 3.2*  CL 98 103  CO2 19* 19*  GLUCOSE 117* 116*  BUN 20 13  CREATININE 1.20* 0.87  CALCIUM 8.6* 8.5*      Intake/Output Summary (Last 24 hours) at 10/01/2022 1414 Last data filed at 10/01/2022 0830 Gross per 24 hour  Intake 2314.44 ml  Output --  Net 2314.44 ml        Physical Exam: Vital Signs Blood pressure 116/75, pulse 80, temperature 98.4 F (36.9 C), resp. rate 19, height 5\' 4"  (1.626 m), weight 87.8 kg, SpO2 97 %.   General: awake, alert, appropriate, but sleepy; NAD HENT: conjugate gaze; oropharynx moist CV: regular rate; no JVD Pulmonary: CTA B/L; no W/R/R- good air movement GI: soft, less TTP- ND; hypoactive BS Psychiatric: appropriate- sleepy Neurological: Ox3  Ext: no clubbing, cyanosis, or edema Psych: flat, does awaken briefly and is cooperative. Skin: No evidence of breakdown, no evidence of rash Neurologic: slow to arouse. Follows commands.  Right sided motor strength is grossly 5/5,    LUE 3- at deltoid , bi, tri, grip, 3- Left HF, KE, 2- ADF  Sensory exam - pt does not attend consistently to participate Left lower back TTP     Assessment/Plan: 1. Functional deficits which require 3+ hours per day of interdisciplinary therapy in a comprehensive inpatient rehab setting. Physiatrist is providing close team supervision and 24 hour management of active medical problems listed below. Physiatrist and rehab team continue to assess barriers to discharge/monitor patient progress toward functional and medical goals  Care Tool:  Bathing    Body parts bathed by patient: Left arm, Chest, Abdomen, Front perineal area, Buttocks, Right upper leg, Left upper leg, Right  lower leg, Face   Body parts bathed by helper: Left lower leg, Right arm     Bathing assist Assist Level: Minimal Assistance - Patient > 75%     Upper Body Dressing/Undressing Upper body dressing   What is the patient wearing?: Pull over shirt    Upper body assist Assist Level: Set up assist    Lower Body Dressing/Undressing Lower body dressing      What is the patient wearing?: Pants,  Incontinence brief     Lower body assist Assist for lower body dressing: Moderate Assistance - Patient 50 - 74%     Toileting Toileting    Toileting assist Assist for toileting: Minimal Assistance - Patient > 75%     Transfers Chair/bed transfer  Transfers assist     Chair/bed transfer assist level: Minimal Assistance - Patient > 75%     Locomotion Ambulation   Ambulation assist      Assist level: Moderate Assistance - Patient 50 - 74% Assistive device: Parallel bars Max distance: 8 ft   Walk 10 feet activity   Assist  Walk 10 feet activity did not occur: Safety/medical concerns        Walk 50 feet activity   Assist Walk 50 feet with 2 turns activity did not occur: Safety/medical concerns         Walk 150 feet activity   Assist Walk 150 feet activity did not occur: Safety/medical concerns         Walk 10 feet on uneven surface  activity   Assist Walk 10 feet on uneven surfaces activity did not occur: Safety/medical concerns         Wheelchair     Assist Is the patient using a wheelchair?: Yes Type of Wheelchair: Manual    Wheelchair assist level: Dependent - Patient 0%      Wheelchair 50 feet with 2 turns activity    Assist        Assist Level: Dependent - Patient 0%   Wheelchair 150 feet activity     Assist      Assist Level: Dependent - Patient 0%   Blood pressure 116/75, pulse 80, temperature 98.4 F (36.9 C), resp. rate 19, height 5\' 4"  (1.626 m), weight 87.8 kg, SpO2 97 %.    Medical Problem List and Plan: 1. Functional deficits secondary to L hemiparesis from R Basal ganglia ICH             -patient may  shower             -ELOS/Goals: 18-21 days min A- PT, OT and SLP  Con't CIR- PT, OT and SLP  Head CT reviewed and shows decrease in size of bleed   2.  Antithrombotics: -DVT/anticoagulation:  Pharmaceutical: Lovenox             -antiplatelet therapy: N/A 3. Headache: Scheduled topamax 100mg   HS. On oxycodone and Fioricet prn.              --will start sleep chart.               4. Mood/Behavior/Sleep: LCSW to follow for evaluation and support.  --Has a lot of stress, anxiety, insomnia-->hasn't wanted to get help per husband.  --chronic insomnia-->up/down multiple times during the night. --antipsychotic agents: N/A 5. Neuropsych/cognition: This patient is capable of making decisions on her own behalf. 6. Skin/Wound Care: Routine pressure relief measures.  7. Fluids/Electrolytes/Nutrition: encourage PO -BUN still elevated 26 but trending down from 1/12  lab (30). 8. HTN: Monitor BP TID. Was not compliant with medications--does not want meds/doesn't like meds, so was noncompliant at home with meds prescribed.  -d/c amlodipine, increase magnesium to 500mg  HS --On Norvasc, Cozaar, Hydralazine and Lopressor.             --SBP goal 130-150 range.   -ordered IV hydralazine 10mg  for SBP >160 9. Hyperlipidemia: LDL 206- on Crestor. 10. Hyponatremia: Question SIADH. Recheck sodium today.  11. Leucocytosis: Likely reactive--still sl elevated  -afebrile --No signs of infection.  12. Acute on chronic Headaches w/ nausea: Took 4 aleve daily.  --Will add Claritin and nose spray to help with congestion as could be exacerbating HA --Increased topamax to 100 mg HS. Added magnesium gluconate 250mg  HS 13. B12 deficiency: given IM B12 given 1/11, discussed that she may benefit from these monthly, or increasing consumption of meat.  14. Left wrist pain: discussed that XR results are negative 15. AKI: Cr elevated, IVF started.  16. Pseudobulbar affect: Nudexta discontinued since patient started feeling nauseous after it was given 17. Insomnia: d/c trazodone, decrease topamax to 50mg , add melatonin 3mg  HS 18. Hypokalemia: supplement klor 40  1/20- K+ 3.2- will replete and recheck Monday 19. Daytime somnolence: d/c klonopin and daytime topamax 20. Hyponatremia: stable at 130, repeat tomorrow  1/20-  Na 132 21. Nausea: IV zofran ordered prn 22. Sepsis- Cholecystitis: IV zosyn and vanc started 1/19. Fever resolved. CXR reviewed and stable. Lactic acid normal. abdomen reviewed and is unremarkable. F/u blood and urine cultures  1/20- U Cx (-)- and blood Cx's so far Negative; AST/ALT down 50+ points and WBC down to 8.0k- con't to monitor-- s/p 1 dose of Vac and Zosyn  For some reason, someone d/c;d the IV ABX at 10:42 am this AM- I don't see a note- for now, have restarted- of note, the U/S of gallbladder was Negative, but no explanation of why so ill- doing better, but not sure if due to time vs IV ABX?  I spent a total of 52   minutes on total care today- >50% coordination of care- due to  D/w pharmacy about IV ABX- will restart also d/w nursing about my concerns and Has diffuse rash per GI- will stop Vac- con't Zosyn for now.       LOS: 11 days A FACE TO FACE EVALUATION WAS PERFORMED  Mart Colpitts 10/01/2022, 2:14 PM

## 2022-10-01 NOTE — Consult Note (Signed)
Consultation Note   Referring Provider:  [facility] PCP: Malva Limes, MD Primary Gastroenterologist: Gentry Fitz        Reason for consultation: Elevated liver tests   Hospital Day: 12    Assessment    #  52 yo female with intracranial hemorrhage early January. Felt to be secondary to uncontrolled HTN  # Acute mid upper abdominal pain  / fever / nausea + vomiting / fever and acute rise in liver chemistries (  alk phos in  300's, AST 194 / ALT 361) . Non-contrast CT scan raises concern for cholecystitis. Though alk phos elevated, her bilirubin has remained normal and no biliary duct dilation on imaging. Given dose of Vanco and Zosyn yesterday. Liver chemistries improved overnight. Afebrile today. Her abdomen is tender in several places on exam but not in the RUQ. Cholecystitis is still a consideration but has been having diarrhea for a couple of days so also wonder about infectious etiology though wouldn't necessarily explain elevated liver tests.   # Diffuse upper body skin rash, new. Secondary to Vancomycin?   # AKI, resolving  # See PMH for additional medical problems  Plan   Acute hepatitis panel is pending Check stool studies given diarrhea / fever Will consider HIDA Am liver tests.  Though not profuse diarrhea she is still having loose stool. Will check stool studies since we do not have a definitive source of fever  HPI   Rylei is a 52 yo female with a past medical history of HTN and headaches. She was admitted to [facility] 09/16/22 with and intracranial hemorrhage secondary to uncontrolled HTN. She has left hemiparesis. She was discharged from hospital and admitted to [facility] on 1/9.   Yesterday she developed upper abdominal pain and fever of 102.3.  The pain was in mid upper abdomen without radiation to her back. She had N/V but she thinks that was related to taking a handful of meds. WBC was normal. liver chemistries were  abnormal  with alk phos of 356, AST 194, ALT 361, Tbili normal.  No acute findings on CXR. UA not suspicious. Blood cultures negative so far.  Wilna has a rash on upper body, ? Vancomycin related.   RUQ [place] was normal.   Non-contrast CT scan shows normal appearing liver. Gallbladder is distended with possible mild wall thickening, lack of IV contrast limits evaluation.   Previous GI Evaluation     none Recent Labs and Imaging US Abdomen Limited RUQ (LIVER/GB)  Result Date: 09/30/2022 CLINICAL DATA:  Mildly and vomiting. EXAM: ULTRASOUND ABDOMEN LIMITED RIGHT UPPER QUADRANT COMPARISON:  CT dated 09/30/2022. FINDINGS: Gallbladder: No gallstones or wall thickening visualized. No sonographic Murphy sign noted by sonographer. Common bile duct: Diameter: 2 mm Liver: The liver is unremarkable. Portal vein is patent on color Doppler imaging with normal direction of blood flow towards the liver. Other: None. IMPRESSION: Unremarkable right upper quadrant ultrasound. Electronically Signed   By: Elgie Collard M.D.   On: 09/30/2022 20:27   DG Chest 2 View  Result Date: 09/30/2022 CLINICAL DATA:  Fever EXAM: CHEST - 2 VIEW COMPARISON:  None Available. FINDINGS: Unchanged cardiomediastinal silhouette. There is no focal airspace consolidation. There is no pleural effusion or evidence of pneumothorax. There  is no acute osseous abnormality. Globular calcifications overlies the right acromiohumeral interval IMPRESSION: No evidence of acute cardiopulmonary disease. Calcific tendinosis of the distal right rotator cuff. Electronically Signed   By: Caprice Renshaw M.D.   On: 09/30/2022 16:09   CT ABDOMEN PELVIS WO CONTRAST  Result Date: 09/30/2022 CLINICAL DATA:  Acute abdominal pain EXAM: CT ABDOMEN AND PELVIS WITHOUT CONTRAST TECHNIQUE: Multidetector CT imaging of the abdomen and pelvis was performed following the standard protocol without IV contrast. RADIATION DOSE REDUCTION: This exam was performed according to  the departmental dose-optimization program which includes automated exposure control, adjustment of the mA and/or kV according to patient size and/or use of iterative reconstruction technique. COMPARISON:  None Available. FINDINGS: Lower chest: Solid pulmonary nodule of the left lower lobe measuring 5 mm on series 5, image 7 Hepatobiliary: No focal liver abnormality. Gallbladder is distended with possible mild wall thickening, lack of IV contrast limits evaluation. No biliary ductal dilation. Pancreas: Unremarkable. No pancreatic ductal dilatation or surrounding inflammatory changes. Spleen: Normal in size without focal abnormality. Adrenals/Urinary Tract: Bilateral adrenal glands are unremarkable. No hydronephrosis or nephrolithiasis. Bladder is unremarkable. Stomach/Bowel: Stomach is within normal limits. Appendix appears normal. No evidence of bowel wall thickening, distention, or inflammatory changes. Vascular/Lymphatic: No significant vascular findings are present. No enlarged abdominal or pelvic lymph nodes. Reproductive: No adnexal masses. Other: No abdominal wall hernia or abnormality. No abdominopelvic ascites. Musculoskeletal: No acute or significant osseous findings. IMPRESSION: 1. Gallbladder is distended with possible mild wall thickening, lack of IV contrast limits evaluation. Correlate for symptoms of right upper quadrant pain and consider gallbladder ultrasound for further evaluation. 2. Solid pulmonary nodule of the left lower lobe measuring 5 mm. No follow-up needed if patient is low-risk.This recommendation follows the consensus statement: Guidelines for Management of Incidental Pulmonary Nodules Detected on CT Images: From the Fleischner Society 2017; Radiology 2017; 284:228-243. Electronically Signed   By: Allegra Lai M.D.   On: 09/30/2022 15:49   CT HEAD WO CONTRAST ( )  Result Date: 09/30/2022 CLINICAL DATA:  Hemorrhagic stroke follow-up. EXAM: CT HEAD WITHOUT CONTRAST TECHNIQUE:  Contiguous axial images were obtained from the base of the skull through the vertex without intravenous contrast. RADIATION DOSE REDUCTION: This exam was performed according to the departmental dose-optimization program which includes automated exposure control, adjustment of the mA and/or kV according to patient size and/or use of iterative reconstruction technique. COMPARISON:  CT head dated September 28, 2022. FINDINGS: Brain: Right thalamic hemorrhage has slightly decreased in size and density since the prior study. Surrounding edema is similar. No new infarct, hydrocephalus, extra-axial collection or mass lesion. No midline shift. Vascular: No hyperdense vessel or unexpected calcification. Skull: Normal. Negative for fracture or focal lesion. Sinuses/Orbits: No acute finding. Other: None. IMPRESSION: 1. Slightly decreased size and density of the right thalamic hemorrhage. No new acute finding. Electronically Signed   By: Obie Dredge M.D.   On: 09/30/2022 15:42   DG Abd 1 View  Result Date: 09/30/2022 CLINICAL DATA:  Nausea vomiting EXAM: ABDOMEN - 1 VIEW COMPARISON:  09/29/2022 FINDINGS: The bowel gas pattern is normal. No radio-opaque calculi or other significant radiographic abnormality are seen. IMPRESSION: Negative. Electronically Signed   By: Layla Maw M.D.   On: 09/30/2022 12:59   DG Abd 1 View  Result Date: 09/29/2022 CLINICAL DATA:  [id number] Emesis [id number] EXAM: ABDOMEN - 1 VIEW COMPARISON:  None Available. FINDINGS: Gaseous distension of colon. No dilated loops of small bowel are visualized. Air is visualized  in the rectum. Pelvic phleboliths. IMPRESSION: Nonobstructive bowel gas pattern. If persistent concern for obstructive physiology, recommend dedicated CT abdomen pelvis with contrast. Electronically Signed   By: Meda KlinefelterStephanie  Peacock M.D.   On: 09/29/2022 16:51   CT HEAD WO CONTRAST (5MM)  Result Date: 09/29/2022 CLINICAL DATA:  Worsening headache EXAM: CT HEAD WITHOUT CONTRAST  TECHNIQUE: Contiguous axial images were obtained from the base of the skull through the vertex without intravenous contrast. RADIATION DOSE REDUCTION: This exam was performed according to the departmental dose-optimization program which includes automated exposure control, adjustment of the mA and/or kV according to patient size and/or use of iterative reconstruction technique. 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 Likely unchanged surrounding edema and mild regional mass effect. No acute infarct, new hemorrhage, mass, or midline shift. No hydrocephalus or extra-axial collection. Vascular: No hyperdense vessel. Skull: Negative for fracture or focal lesion. Sinuses/Orbits: Clear paranasal sinuses. No acute finding in the orbits. Other: The mastoids are well aerated. IMPRESSION: 1. 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 2. No acute intracranial process. Electronically Signed   By: Wiliam KeAlison  Vasan M.D.   On: 09/29/2022 01:43   CT HEAD WO CONTRAST (5MM)  Result Date: 09/24/2022 CLINICAL DATA:  52 year old female who presented with right basal ganglia hemorrhage on January 5th. Subsequent encounter. EXAM: CT HEAD WITHOUT CONTRAST TECHNIQUE: Contiguous axial images were obtained from the base of the skull through the vertex without intravenous contrast. RADIATION DOSE REDUCTION: This exam was performed according to the departmental dose-optimization program which includes automated exposure control, adjustment of the mA and/or kV according to patient size and/or use of iterative reconstruction technique. COMPARISON:  09/19/2022 and earlier. FINDINGS: Brain: Hyperdense right lateral thalamic hemorrhage is stable and size and has begun to fade. More indistinct  appearance of blood products now. Continued surrounding edema. Stable mild regional mass effect. Intraventricular blood has largely resolved. No ventriculomegaly. Basilar cisterns remain patent. Stable gray-white differentiation elsewhere. No new No acute intracranial abnormality. Vascular: Calcified atherosclerosis at the skull base. No suspicious intracranial vascular hyperdensity. Skull: No acute osseous abnormality identified. Sinuses/Orbits: Visualized paranasal sinuses and mastoids are stable and well aerated. Other: Visualized orbits and scalp soft tissues are within normal limits. IMPRESSION: 1. Resolved intraventricular hemorrhage and fading of the right lateral thalamic blood products since 09/19/2022. Stable mild regional edema and mass effect. 2. No new intracranial abnormality. Electronically Signed   By: Odessa FlemingH  Hall M.D.   On: 09/24/2022 10:21   DG Wrist 2 Views Left  Result Date: 09/23/2022 CLINICAL DATA:  Left-sided wrist pain EXAM: LEFT WRIST - 2 VIEW COMPARISON:  None Available. FINDINGS: There is no evidence of fracture or dislocation. There is no evidence of arthropathy or other focal bone abnormality. Soft tissues are unremarkable. If there is persistent pain or further concern for scaphoid injury recommend follow-up imaging in 7-10 days as these injuries can be acutely x-ray occult. IMPRESSION: No acute osseous abnormality Electronically Signed   By: Karen KaysAshok  Gupta M.D.   On: 09/23/2022 17:44   CT HEAD WO CONTRAST (5MM)  Result Date: 09/19/2022 CLINICAL DATA:  Intracranial hemorrhage EXAM: CT HEAD WITHOUT CONTRAST TECHNIQUE: Contiguous axial images were obtained from the base of the skull through the vertex without intravenous contrast. RADIATION DOSE REDUCTION: This exam was performed according to the departmental dose-optimization program  which includes automated exposure control, adjustment of the mA and/or kV according to patient size and/or use of iterative reconstruction technique.  COMPARISON:  CT head 09/16/2022 FINDINGS: Brain: The 1.6 cm AP x 1.0 cm TV x 1.9 cm cc right thalamic hematoma is unchanged in size. Intraventricular extension of the body of the right lateral ventricle is similar to the prior study; however, blood in the third and fourth ventricles is decreased. There is mild surrounding edema with partial effacement of the adjacent right lateral ventricle but no midline shift. The ventricular system is stable in size and configuration without evidence of developing hydrocephalus. There is no new acute intracranial hemorrhage or extra-axial fluid collection. There is no acute territorial infarct. There is no mass lesion. Vascular: No hyperdense vessel or unexpected calcification. Skull: Normal. Negative for fracture or focal lesion. Sinuses/Orbits: Stable. IMPRESSION: 1. Stable size of the right thalamic hematoma with mild surrounding edema but no midline shift, and decreased intraventricular blood 2. No new acute intracranial pathology. Electronically Signed   By: Valetta Mole M.D.   On: 09/19/2022 20:34   ECHOCARDIOGRAM COMPLETE  Result Date: 09/17/2022    ECHOCARDIOGRAM REPORT   Patient Name:   DAVIE SAGONA Date of Exam: 09/17/2022 Medical Rec #:  [id number]        Height:       64.0 in Accession #:    [id number]       Weight:       203.7 lb Date of Birth:  [birth year]        BSA:          1.972 m Patient Age:    26 years         BP:           125/79 mmHg Patient Gender: F                HR:           86 bpm. Exam Location:  Inpatient Procedure: 2D Echo, Cardiac Doppler and Color Doppler Indications:    Stroke  History:        Patient has no prior history of Echocardiogram examinations.                 Risk Factors:Hypertension.  Sonographer:    Clayton Lefort RDCS (AE) Referring Phys: Altamease Oiler DE LA TORRE  Sonographer Comments: Suboptimal subcostal window. IMPRESSIONS  1. Left ventricular ejection fraction, by estimation, is 60 to 65%. The left ventricle has normal function.  The left ventricle has no regional wall motion abnormalities. There is mild left ventricular hypertrophy. Left ventricular diastolic parameters are consistent with Grade I diastolic dysfunction (impaired relaxation).  2. Right ventricular systolic function is normal. The right ventricular size is mildly enlarged.  3. The mitral valve is abnormal. Trivial mitral valve regurgitation. No evidence of mitral stenosis.  4. The aortic valve is tricuspid. Aortic valve regurgitation is not visualized. No aortic stenosis is present. Comparison(s): No prior Echocardiogram. FINDINGS  Left Ventricle: Left ventricular ejection fraction, by estimation, is 60 to 65%. The left ventricle has normal function. The left ventricle has no regional wall motion abnormalities. The left ventricular internal cavity size was normal in size. There is  mild left ventricular hypertrophy. Left ventricular diastolic parameters are consistent with Grade I diastolic dysfunction (impaired relaxation). Right Ventricle: The right ventricular size is mildly enlarged. No increase in right ventricular wall thickness. Right ventricular systolic function is normal. Left Atrium: Left atrial size was normal  in size. Right Atrium: Right atrial size was normal in size. Pericardium: There is no evidence of pericardial effusion. Mitral Valve: The mitral valve is abnormal. Mild to moderate mitral annular calcification. Trivial mitral valve regurgitation. No evidence of mitral valve stenosis. Tricuspid Valve: The tricuspid valve is not well visualized. Tricuspid valve regurgitation is not demonstrated. No evidence of tricuspid stenosis. Aortic Valve: The aortic valve is tricuspid. Aortic valve regurgitation is not visualized. No aortic stenosis is present. Aortic valve mean gradient measures 4.0 mmHg. Aortic valve peak gradient measures 7.7 mmHg. Aortic valve area, by VTI measures 2.42 cm. Pulmonic Valve: The pulmonic valve was not well visualized. Pulmonic valve  regurgitation is trivial. No evidence of pulmonic stenosis. Aorta: The aortic root is normal in size and structure. Venous: The inferior vena cava was not well visualized. IAS/Shunts: The interatrial septum was not well visualized.  LEFT VENTRICLE PLAX 2D LVIDd:         4.30 cm   Diastology LVIDs:         2.90 cm   LV e' medial:    6.64 cm/s LV PW:         1.20 cm   LV E/e' medial:  7.9 LV IVS:        1.30 cm   LV e' lateral:   6.20 cm/s LVOT diam:     2.00 cm   LV E/e' lateral: 8.5 LV SV:         62 LV SV Index:   31 LVOT Area:     3.14 cm  RIGHT VENTRICLE RV Basal diam:  3.80 cm RV Mid diam:    3.50 cm RV S prime:     14.50 cm/s TAPSE (M-mode): 2.3 cm LEFT ATRIUM             Index        RIGHT ATRIUM           Index LA diam:        3.10 cm 1.57 cm/m   RA Area:     18.40 cm LA Vol (A2C):   34.1 ml 17.29 ml/m  RA Volume:   53.70 ml  27.24 ml/m LA Vol (A4C):   22.1 ml 11.21 ml/m LA Biplane Vol: 27.7 ml 14.05 ml/m  AORTIC VALVE AV Area (Vmax):    2.42 cm AV Area (Vmean):   2.38 cm AV Area (VTI):     2.42 cm AV Vmax:           139.00 cm/s AV Vmean:          93.000 cm/s AV VTI:            0.254 m AV Peak Grad:      7.7 mmHg AV Mean Grad:      4.0 mmHg LVOT Vmax:         107.00 cm/s LVOT Vmean:        70.600 cm/s LVOT VTI:          0.196 m LVOT/AV VTI ratio: 0.77  AORTA Ao Root diam: 2.90 cm Ao Asc diam:  3.00 cm MITRAL VALVE MV Area (PHT): 2.77 cm    SHUNTS MV Decel Time: 274 msec    Systemic VTI:  0.20 m MV E velocity: 52.50 cm/s  Systemic Diam: 2.00 cm MV A velocity: 68.80 cm/s MV E/A ratio:  0.76 Vishnu Priya Mallipeddi Electronically signed by Winfield RastVishnu Priya Mallipeddi Signature Date/Time: 09/17/2022/11:49:50 AM    Final    CT HEAD WO CONTRAST (  )  Result Date: 09/16/2022 CLINICAL DATA:  Headache, sudden, severe Hydrocephalus EXAM: CT HEAD WITHOUT CONTRAST TECHNIQUE: Contiguous axial images were obtained from the base of the skull through the vertex without intravenous contrast. RADIATION DOSE REDUCTION:  This exam was performed according to the departmental dose-optimization program which includes automated exposure control, adjustment of the mA and/or kV according to patient size and/or use of iterative reconstruction technique. COMPARISON:  1:28 p.m. FINDINGS: Brain: 12 x 17 x 20 mm (volume = 2.1 mL) hematoma within the right caudate body/superior thalamus is stable with extension of hemorrhage into the right lateral ventricle, third and fourth ventricle again noted. Ventricular size is normal and stable. No significant mass effect or midline shift. No acute infarct. Cerebellum is unremarkable. Vascular: No hyperdense vessel or unexpected calcification. Skull: Normal. Negative for fracture or focal lesion. Sinuses/Orbits: Visualized orbits are unremarkable. Paranasal sinuses are clear. Other: Mastoid air cells and middle ear cavities are clear. IMPRESSION: 1. Stable right caudate body/superior thalamus hematoma with extension of hemorrhage into the right lateral ventricle, third and fourth ventricle. Ventricular size is normal and stable. No significant mass effect or midline shift. 2. No acute infarct. Electronically Signed   By: Helyn Numbers M.D.   On: 09/16/2022 21:44   MR BRAIN W WO CONTRAST  Result Date: 09/16/2022 CLINICAL DATA:  Nonhemorrhagic stroke. EXAM: MRI HEAD WITHOUT AND WITH CONTRAST TECHNIQUE: Multiplanar, multiecho pulse sequences of the brain and surrounding structures were obtained without and with intravenous contrast. CONTRAST:  9.62mL GADAVIST GADOBUTROL 1 MMOL/ML IV SOLN COMPARISON:  Head CT and CTA from earlier today FINDINGS: Brain: Known acute hematoma in the right thalamus and adjacent white matter with superior extension into the caudate body and right lateral ventricle. Blood clot reaches the fourth ventricle, no detected change from earlier CT. Small area of shine through in the left periventricular white matter, other areas of mild diffusion hyperintensity are related to  susceptibility artifact from intraventricular clot. White matter disease with notable periventricular ovoid pattern at the left lateral ventricle and hazy T2 hyperintensity in the right pons, demyelinating disease is considered. No abnormal enhancement at the level of the hemorrhage. No masslike finding throughout the brain. Vascular: Major flow voids and vascular enhancements are preserved. There was preceding CTA. Skull and upper cervical spine: Normal marrow signal. Sinuses/Orbits: Negative. IMPRESSION: 1. No lesion or infarct seen underlying the right thalamic hematoma. No detected rebleeding and no hydrocephalus. 2. Areas of T2 hyperintensity with periventricular and pontine involvement, question symptoms of multiple sclerosis. Given the atheromatous changes by CTA this may instead reflect premature small vessel disease in this patient with hypertension. Electronically Signed   By: Tiburcio Pea M.D.   On: 09/16/2022 19:03   CT ANGIO HEAD NECK W WO CM  Result Date: 09/16/2022 CLINICAL DATA:  Neuro deficit, acute, stroke suspected. Right basal ganglia hemorrhage. Rule out AVM. EXAM: CT ANGIOGRAPHY HEAD AND NECK TECHNIQUE: Multidetector CT imaging of the head and neck was performed using the standard protocol during bolus administration of intravenous contrast. Multiplanar CT image reconstructions and MIPs were obtained to evaluate the vascular anatomy. Carotid stenosis measurements (when applicable) are obtained utilizing NASCET criteria, using the distal internal carotid diameter as the denominator. RADIATION DOSE REDUCTION: This exam was performed according to the departmental dose-optimization program which includes automated exposure control, adjustment of the mA and/or kV according to patient size and/or use of iterative reconstruction technique. CONTRAST:  75mL OMNIPAQUE IOHEXOL 350 MG/ML SOLN COMPARISON:  None Available. FINDINGS: CTA  NECK FINDINGS Aortic arch: Standard 3 vessel aortic arch. Wide  patency of the brachiocephalic and subclavian arteries. Right carotid system: Patent without evidence of stenosis or dissection. Left carotid system: Patent with mild atheromatous wall thickening of the mid common carotid artery. No evidence of a significant stenosis or dissection. Vertebral arteries: Patent without evidence of stenosis or dissection. Strongly dominant left vertebral artery. Skeleton: Mild cervical spondylosis. Other neck: No evidence of cervical lymphadenopathy or mass. Upper chest: Clear lung apices. Review of the MIP images confirms the above findings CTA HEAD FINDINGS Anterior circulation: The internal carotid arteries are patent from skull base to carotid termini with mild atherosclerotic irregularity but no significant stenosis. ACAs and MCAs are patent with mild branch vessel irregularity but no evidence of a proximal branch occlusion or significant proximal stenosis. No aneurysm or vascular malformation is identified, and there is no spot sign within the right basal ganglia hemorrhage. Posterior circulation: The intracranial vertebral arteries are widely patent to the basilar. Patent PICA, AICA, and SCA origins are seen bilaterally. The basilar artery is patent with mild atherosclerotic irregularity but no significant stenosis. There is a moderate-sized right posterior communicating artery. Moderate right P1 and mild bilateral P2 stenoses are present. No aneurysm is identified. Venous sinuses: Poorly evaluated due to arterial contrast timing. Anatomic variants: None. Review of the MIP images confirms the above findings IMPRESSION: 1. No evidence of a vascular malformation. 2. Intracranial atherosclerosis including moderate right P1 and mild bilateral P2 stenoses. 3. Widely patent carotid and vertebral arteries. Electronically Signed   By: Sebastian Ache M.D.   On: 09/16/2022 14:17   CT HEAD CODE STROKE WO CONTRAST  Result Date: 09/16/2022 CLINICAL DATA:  Code stroke.  Neuro deficit, acute,  stroke suspected EXAM: CT HEAD WITHOUT CONTRAST TECHNIQUE: Contiguous axial images were obtained from the base of the skull through the vertex without intravenous contrast. RADIATION DOSE REDUCTION: This exam was performed according to the departmental dose-optimization program which includes automated exposure control, adjustment of the mA and/or kV according to patient size and/or use of iterative reconstruction technique. COMPARISON:  None Available. FINDINGS: Brain: Approximately 1.6 x 1.4 x 2.0 cm acute hemorrhage (estimated volume of 2.2 mL) in the right basal ganglia extending and overlying corona radiata. Intraventricular extension of hemorrhage into the right lateral ventricle, third ventricle, and fourth ventricle. No hydrocephalus at this time. No midline shift. No evidence of acute large vascular territory infarct. Vascular: No hyperdense vessel identified. Skull: No acute fracture. Sinuses/Orbits: Mild right sphenoid sinus mucosal thickening. Other: No mastoid effusions. ASPECTS [facility] Stroke Program Early CT Score) IMPRESSION: Acute hemorrhage in the right basal ganglia extend overlying corona radiata, described above. Intraventricular extension of hemorrhage. Findings discussed with Dr. Selina Cooley at 1:34 PM via telephone. Electronically Signed   By: Feliberto Harts M.D.   On: 09/16/2022 13:35    Labs:  Recent Labs    09/30/22 1303 10/01/22 0959  WBC 10.2 8.0  HGB 13.7 12.5  HCT 37.6 36.4  PLT 236 248   Recent Labs    09/30/22 0634 09/30/22 1440 10/01/22 0959  NA 130* 128* 132*  K 3.3* 3.1* 3.2*  CL 100 98 103  CO2 18* 19* 19*  GLUCOSE 114* 117* 116*  BUN 27* 20 13  CREATININE 1.49* 1.20* 0.87  CALCIUM 8.7* 8.6* 8.5*   Recent Labs    10/01/22 0959  PROT 5.9*  ALBUMIN 2.9*  AST 129*  ALT 306*  ALKPHOS 306*  BILITOT 0.2*  BILIDIR 0.2  IBILI 0.0*   No results for input(s): "HEPBSAG", "HCVAB", "HEPAIGM", "HEPBIGM" in the last 72 hours. Recent Labs    09/30/22 1440   LABPROT 15.3*  INR 1.2    Past Medical History:  Diagnosis Date   Anxiety    Chronic right shoulder pain    Gastrocnemius strain, left    did outpatient Tx w/ improvement   Hypertension    Insomnia     Past Surgical History:  Procedure Laterality Date   ABDOMINAL HYSTERECTOMY     CESAREAN SECTION      Family History  Problem Relation Age of Onset   High blood pressure Mother    Osteoporosis Mother    High blood pressure Father    Heart Problems Father    Diabetes Father    Hypertension Father    Stroke Father    Heart attack Father    High blood pressure Sister    Depression Sister    Diabetes Sister    Hypertension Sister    High blood pressure Brother    Stroke Maternal Grandfather    Hypertension Maternal Aunt    Obesity Maternal Aunt    Hypertension Maternal Uncle    Kidney disease Maternal Uncle    Glaucoma Maternal Uncle    Stroke Paternal Uncle    Stroke Other     Prior to Admission medications   Medication Sig Start Date End Date Taking? Authorizing Provider  acetaminophen (TYLENOL) 325 MG tablet Take 2 tablets (650 mg total) by mouth every 4 (four) hours as needed for mild pain. 09/27/22   Love, Ivan Anchors, PA-C  amLODipine (NORVASC) 10 MG tablet Take 1 tablet (10 mg total) by mouth daily. 09/21/22   Oswald Hillock, MD  hydrALAZINE (APRESOLINE) 100 MG tablet Take 1 tablet (100 mg total) by mouth every 8 (eight) hours. 09/20/22   Oswald Hillock, MD  hydrALAZINE (APRESOLINE) 20 MG/ML injection Inject 0.5 mLs (10 mg total) into the vein every 4 (four) hours as needed (sbp greater than 150 despite labetolol PRN). 09/20/22   Oswald Hillock, MD  labetalol (NORMODYNE) 5 MG/ML injection Inject 2-4 mLs (10-20 mg total) into the vein every 2 (two) hours as needed (SBP >150). 09/20/22   Oswald Hillock, MD  loratadine (CLARITIN) 10 MG tablet Take 1 tablet (10 mg total) by mouth daily. 09/28/22   Love, Ivan Anchors, PA-C  losartan (COZAAR) 100 MG tablet Take 1 tablet (100 mg total) by  mouth daily. 09/21/22   Oswald Hillock, MD  metoprolol tartrate (LOPRESSOR) 50 MG tablet Take 1 tablet (50 mg total) by mouth 2 (two) times daily. 09/20/22   Oswald Hillock, MD  oxyCODONE (OXY IR/ROXICODONE) 5 MG immediate release tablet Take 1 tablet (5 mg total) by mouth every 6 (six) hours as needed for severe pain. 09/20/22   Oswald Hillock, MD  rosuvastatin (CRESTOR) 40 MG tablet Take 1 tablet (40 mg total) by mouth daily. 09/21/22   Oswald Hillock, MD    Current Facility-Administered Medications  Medication Dose Route Frequency Provider Last Rate Last Admin   0.9 %  sodium chloride infusion   Intravenous Continuous Bary Leriche, PA-C 100 mL/hr at 10/01/22 0344 Infusion Verify at 10/01/22 0344   acetaminophen (TYLENOL) suppository 650 mg  650 mg Rectal Q4H PRN Bary Leriche, PA-C   650 mg at 09/30/22 1346   acetaminophen (TYLENOL) tablet 325-650 mg  325-650 mg Oral Q4H PRN Bary Leriche, PA-C   650  mg at 09/29/22 1340   alum & mag hydroxide-simeth (MAALOX/MYLANTA) 200-200-20 MG/5ML suspension 30 mL  30 mL Oral Q4H PRN Love, Pamela S, PA-C       bisacodyl (DULCOLAX) suppository 10 mg  10 mg Rectal Daily PRN Love, Pamela S, PA-C       diphenhydrAMINE (BENADRYL) 12.5 MG/5ML elixir 12.5-25 mg  12.5-25 mg Oral Q6H PRN Love, Pamela S, PA-C       enoxaparin (LOVENOX) injection 40 mg  40 mg Subcutaneous Q24H Love, Pamela S, PA-C   40 mg at 09/30/22 2038   fluticasone (FLONASE) 50 MCG/ACT nasal spray 1 spray  1 spray Each Nare Daily Jacquelynn Cree, PA-C   1 spray at 10/01/22 0753   folic acid-pyridoxine-cyancobalamin (FOLTX) 2.5-25-2 MG per tablet 1 tablet  1 tablet Oral Daily Jacquelynn Cree, PA-C   1 tablet at 10/01/22 0750   guaiFENesin-dextromethorphan (ROBITUSSIN DM) 100-10 MG/5ML syrup 5-10 mL  5-10 mL Oral Q6H PRN Love, Pamela S, PA-C       hydrALAZINE (APRESOLINE) injection 10 mg  10 mg Intravenous Q4H PRN Madelyn Flavors A, MD       hydrALAZINE (APRESOLINE) tablet 100 mg  100 mg Oral Q8H Love, Pamela  S, PA-C   100 mg at 10/01/22 0502   loratadine (CLARITIN) tablet 10 mg  10 mg Oral Daily Jacquelynn Cree, PA-C   10 mg at 10/01/22 0750   losartan (COZAAR) tablet 100 mg  100 mg Oral Daily Jacquelynn Cree, PA-C   100 mg at 10/01/22 0750   magnesium gluconate (MAGONATE) tablet 500 mg  500 mg Oral QHS Raulkar, Drema Pry, MD       metoprolol tartrate (LOPRESSOR) injection 5 mg  5 mg Intravenous Q6H PRN Raulkar, Drema Pry, MD   5 mg at 10/01/22 0129   metoprolol tartrate (LOPRESSOR) tablet 50 mg  50 mg Oral BID Delle Reining S, PA-C   50 mg at 10/01/22 0750   ondansetron (ZOFRAN) injection 4 mg  4 mg Intravenous Q6H PRN Horton Chin, MD   4 mg at 09/30/22 2038   oxyCODONE (Oxy IR/ROXICODONE) immediate release tablet 5 mg  5 mg Oral Q4H PRN Love, Pamela S, PA-C   5 mg at 10/01/22 0750   pantoprazole (PROTONIX) EC tablet 40 mg  40 mg Oral Daily Jacquelynn Cree, PA-C   40 mg at 10/01/22 0750   polyethylene glycol (MIRALAX / GLYCOLAX) packet 17 g  17 g Oral Daily PRN Jacquelynn Cree, PA-C   17 g at 09/26/22 5400   potassium chloride (KLOR-CON) packet 40 mEq  40 mEq Oral Once Raulkar, Drema Pry, MD       prochlorperazine (COMPAZINE) tablet 5-10 mg  5-10 mg Oral Q6H PRN Jacquelynn Cree, PA-C   10 mg at 09/29/22 8676   Or   prochlorperazine (COMPAZINE) injection 5-10 mg  5-10 mg Intramuscular Q6H PRN Jacquelynn Cree, PA-C   10 mg at 09/30/22 0940   Or   prochlorperazine (COMPAZINE) suppository 12.5 mg  12.5 mg Rectal Q6H PRN Love, Pamela S, PA-C       senna-docusate (Senokot-S) tablet 2 tablet  2 tablet Oral Q supper Love, Pamela S, PA-C   2 tablet at 09/28/22 1813   sertraline (ZOLOFT) tablet 50 mg  50 mg Oral Daily Erick Colace, MD   50 mg at 10/01/22 0750   sodium phosphate (FLEET) 7-19 GM/118ML enema 1 enema  1 enema Rectal Once PRN Jacquelynn Cree,  PA-C       topiramate (TOPAMAX) tablet 50 mg  50 mg Oral QHS Raulkar, Drema PryKrutika P, MD       triamcinolone 0.1 % cream : eucerin cream, 1:1   Topical TID  Jacquelynn CreeLove, Pamela S, PA-C   Given at 09/29/22 2116    Allergies as of 09/20/2022   (No Known Allergies)    Social History   Socioeconomic History   Marital status: Married    Spouse name: Freida Busmanllen   Number of children: 3   Years of education: Not on file   Highest education level: Not on file  Occupational History   Not on file  Tobacco Use   Smoking status: Never   Smokeless tobacco: Never  Vaping Use   Vaping Use: Never used  Substance and Sexual Activity   Alcohol use: Yes    Alcohol/week: 1.0 standard drink of alcohol    Types: 1 Glasses of wine per week   Drug use: No   Sexual activity: Not on file  Other Topics Concern   Not on file  Social History Narrative   3 children   Social Determinants of Health   Financial Resource Strain: Not on file  Food Insecurity: No Food Insecurity (09/16/2022)   Hunger Vital Sign    Worried About Running Out of Food in the Last Year: Never true    Ran Out of Food in the Last Year: Never true  Transportation Needs: No Transportation Needs (09/16/2022)   PRAPARE - Administrator, Civil ServiceTransportation    Lack of Transportation (Medical): No    Lack of Transportation (Non-Medical): No  Physical Activity: Not on file  Stress: Not on file  Social Connections: Not on file  Intimate Partner Violence: Not At Risk (09/16/2022)   Humiliation, Afraid, Rape, and Kick questionnaire    Fear of Current or Ex-Partner: No    Emotionally Abused: No    Physically Abused: No    Sexually Abused: No    Review of Systems: All systems reviewed and negative except where noted in HPI.  Physical Exam: Vital signs in last 24 hours: Temp:  [97.5 F (36.4 C)-101.9 F (38.8 C)] 98.4 F (36.9 C) (01/20 1001) Pulse Rate:  [80-125] 80 (01/20 1001) Resp:  [16-28] 19 (01/20 1300) BP: (116-198)/(64-98) 116/75 (01/20 1001) SpO2:  [95 %-99 %] 97 % (01/20 1001) Last BM Date : 10/01/22  General:  Alert female in NAD Psych:  Pleasant, cooperative. Normal mood and affect Eyes: Pupils  equal Ears:  Normal auditory acuity Nose: No deformity, discharge or lesions Neck:  Supple, no masses felt Lungs:  Clear to auscultation.  Heart:  Regular rate, regular rhythm.  Abdomen:  Soft, nondistended, several areas of localized tenderness but nontender in RUQ.   Active bowel sounds, no masses felt Rectal :  Deferred Msk: Symmetrical without gross deformities.  Neurologic:  Alert, oriented, grossly normal neurologically Extremities : No edema Skin:  Intact without significant lesions.    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

## 2022-10-02 LAB — COMPREHENSIVE METABOLIC PANEL
ALT: 273 U/L — ABNORMAL HIGH (ref 0–44)
AST: 90 U/L — ABNORMAL HIGH (ref 15–41)
Albumin: 2.8 g/dL — ABNORMAL LOW (ref 3.5–5.0)
Alkaline Phosphatase: 340 U/L — ABNORMAL HIGH (ref 38–126)
Anion gap: 10 (ref 5–15)
BUN: 16 mg/dL (ref 6–20)
CO2: 20 mmol/L — ABNORMAL LOW (ref 22–32)
Calcium: 8.5 mg/dL — ABNORMAL LOW (ref 8.9–10.3)
Chloride: 101 mmol/L (ref 98–111)
Creatinine, Ser: 0.85 mg/dL (ref 0.44–1.00)
GFR, Estimated: >60 mL/min (ref >60)
Glucose, Bld: 89 mg/dL (ref 70–99)
Potassium: 3 mmol/L — ABNORMAL LOW (ref 3.5–5.1)
Sodium: 131 mmol/L — ABNORMAL LOW (ref 135–145)
Total Bilirubin: 0.4 mg/dL (ref 0.3–1.2)
Total Protein: 6 g/dL — ABNORMAL LOW (ref 6.5–8.1)

## 2022-10-02 LAB — CBC
HCT: 35.4 % — ABNORMAL LOW (ref 36.0–46.0)
Hemoglobin: 12.6 g/dL (ref 12.0–15.0)
MCH: 28.6 pg (ref 26.0–34.0)
MCHC: 35.6 g/dL (ref 30.0–36.0)
MCV: 80.3 fL (ref 80.0–100.0)
Platelets: 237 10*3/uL (ref 150–400)
RBC: 4.41 MIL/uL (ref 3.87–5.11)
RDW: 12.8 % (ref 11.5–15.5)
WBC: 9.1 10*3/uL (ref 4.0–10.5)
nRBC: 0 % (ref 0.0–0.2)

## 2022-10-02 LAB — MAGNESIUM: Magnesium: 1.8 mg/dL (ref 1.7–2.4)

## 2022-10-02 LAB — PROCALCITONIN: Procalcitonin: 0.94 ng/mL

## 2022-10-02 MED ORDER — POTASSIUM CHLORIDE 20 MEQ PO PACK
40.0000 meq | PACK | Freq: Two times a day (BID) | ORAL | Status: DC
  Administered 2022-10-02 – 2022-10-03 (×3): 40 meq via ORAL
  Filled 2022-10-02 (×3): qty 2

## 2022-10-02 MED ORDER — ACETAMINOPHEN 500 MG PO TABS
500.0000 mg | ORAL_TABLET | ORAL | Status: DC | PRN
  Administered 2022-10-10 – 2022-10-11 (×2): 500 mg via ORAL
  Filled 2022-10-02 (×2): qty 1

## 2022-10-02 MED ORDER — METOPROLOL TARTRATE 50 MG PO TABS
100.0000 mg | ORAL_TABLET | Freq: Two times a day (BID) | ORAL | Status: DC
  Administered 2022-10-02 – 2022-10-07 (×10): 100 mg via ORAL
  Filled 2022-10-02 (×10): qty 2

## 2022-10-02 MED ORDER — AMLODIPINE BESYLATE 10 MG PO TABS
10.0000 mg | ORAL_TABLET | Freq: Every day | ORAL | Status: DC
  Administered 2022-10-03 – 2022-10-06 (×4): 10 mg via ORAL
  Filled 2022-10-02 (×5): qty 1

## 2022-10-02 MED ORDER — POTASSIUM CHLORIDE 10 MEQ/100ML IV SOLN
10.0000 meq | INTRAVENOUS | Status: DC
  Administered 2022-10-02: 10 meq via INTRAVENOUS
  Filled 2022-10-02 (×4): qty 100

## 2022-10-02 MED ORDER — ACETAMINOPHEN 650 MG RE SUPP: 325.0000 mg | RECTAL | Status: DC | PRN

## 2022-10-02 MED ORDER — SACCHAROMYCES BOULARDII 250 MG PO CAPS
250.0000 mg | ORAL_CAPSULE | Freq: Two times a day (BID) | ORAL | Status: AC
  Administered 2022-10-02 – 2022-10-06 (×10): 250 mg via ORAL
  Filled 2022-10-02 (×10): qty 1

## 2022-10-02 MED ORDER — POTASSIUM CHLORIDE CRYS ER 20 MEQ PO TBCR: 30.0000 meq | EXTENDED_RELEASE_TABLET | Freq: Three times a day (TID) | ORAL | Status: DC

## 2022-10-02 MED ORDER — SODIUM CHLORIDE 0.9 % IV SOLN: 8.0000 mg | Freq: Four times a day (QID) | INTRAVENOUS | Status: DC | PRN

## 2022-10-02 MED ORDER — SODIUM CHLORIDE 0.45 % IV SOLN: INTRAVENOUS | Status: DC

## 2022-10-02 MED ORDER — HYDRALAZINE HCL 50 MG PO TABS: 50.0000 mg | ORAL_TABLET | Freq: Three times a day (TID) | ORAL | Status: DC

## 2022-10-02 MED ORDER — ONDANSETRON HCL 4 MG/2ML IJ SOLN: 4.0000 mg | Freq: Four times a day (QID) | INTRAMUSCULAR | Status: DC | PRN

## 2022-10-02 NOTE — Plan of Care (Signed)
  Problem: Consults Goal: RH STROKE PATIENT EDUCATION Description: See Patient Education module for education specifics  Outcome: Progressing   Problem: RH BOWEL ELIMINATION Goal: RH STG MANAGE BOWEL WITH ASSISTANCE Description: STG Manage Bowel with mod I Assistance. Outcome: Progressing Goal: RH STG MANAGE BOWEL W/MEDICATION W/ASSISTANCE Description: STG Manage Bowel with Medication with mod I Assistance. Outcome: Progressing   Problem: RH SAFETY Goal: RH STG ADHERE TO SAFETY PRECAUTIONS W/ASSISTANCE/DEVICE Description: STG Adhere to Safety Precautions With cues Assistance/Device. Outcome: Progressing   Problem: RH PAIN MANAGEMENT Goal: RH STG PAIN MANAGED AT OR BELOW PT'S PAIN GOAL Description: < 4 with prns Outcome: Progressing   Problem: RH KNOWLEDGE DEFICIT Goal: RH STG INCREASE KNOWLEDGE OF HYPERTENSION Description: Patient and spouse will be able to manage HTN with medications and dietary modifications using educational resources independently Outcome: Progressing Goal: RH STG INCREASE KNOWLEGDE OF HYPERLIPIDEMIA Description: Patient and spouse will be able to manage HLD with medications and dietary modifications using educational resources independently Outcome: Progressing   Problem: Education: Goal: Knowledge of disease or condition will improve Outcome: Progressing Goal: Knowledge of secondary prevention will improve (MUST DOCUMENT ALL) Outcome: Progressing Goal: Knowledge of patient specific risk factors will improve Elta [place] N/A or DELETE if not current risk factor) Outcome: Progressing   Problem: Intracerebral Hemorrhage Tissue Perfusion: Goal: Complications of Intracerebral Hemorrhage will be minimized Outcome: Progressing   Problem: Coping: Goal: Will verbalize positive feelings about self Outcome: Progressing Goal: Will identify appropriate support needs Outcome: Progressing   Problem: Health Behavior/Discharge Planning: Goal: Ability to manage  health-related needs will improve Outcome: Progressing Goal: Goals will be collaboratively established with patient/family Outcome: Progressing   Problem: Self-Care: Goal: Ability to participate in self-care as condition permits will improve Outcome: Progressing Goal: Verbalization of feelings and concerns over difficulty with self-care will improve Outcome: Progressing Goal: Ability to communicate needs accurately will improve Outcome: Progressing   Problem: Nutrition: Goal: Risk of aspiration will decrease Outcome: Progressing Goal: Dietary intake will improve Outcome: Progressing

## 2022-10-02 NOTE — Progress Notes (Addendum)
Daily Progress Note  Hospital Day: 13  Chief Complaint: elevated liver chemistries, abdominal pain, loose stool  Brief Narrative:  Kelly Stephenson is a 52 y.o. female with recent ICH related to uncontrolled HTN. Had developed upper abdominal pain / fever / abnormal liver chemistries a few days ago. We saw her in consult 1/20.   Assessment     Acute mid upper abdominal pain Fever ( resolved)  Nausea + vomiting ( resolved) Acute rise in liver chemistries.  RUQ normal. Non-contrast CT scan raised concern for cholecystitis but still hasn't had a white count and not really tender in RUQ. She was given empiric dose of Vanco and Zosyn a couple of days ago.  Not clear what caused of her symptoms and elevated liver tests but symptoms have resolved and liver tests improving with except of alk phos.  Acute viral hep studies   Loose stool Given the fever and a loose stool we ordered stool studies yesterday. She had a total of 1 loose BM yesterday and so far no BMs today    Plan:  Cancel stool studies Continue to monitor liver chemistries.  Avoid hepatotoxic medications.      GI ATTENDING ATTESTATION:  Patient clinically improved, no further pain, has remained afebrile. Diarrhea resolved. Her liver enzymes are improving but not resolved. Acute hep panel negative. Stool studies not sent has she has not had any further diarrhea. Unclear what caused this. RUQ US shows no gallstones or evidence of cholecystitis. DILI also possible - she takes hydralazine which is a well known cause of drug induced liver injury which can be associated with fever / rash although she has not had eosinophilia which is also often related to this. Given this occurrence I'd recommend she stop Hydralazine for now and treat with an alternative agent and continue to trend her liver enzymes daily. If the enzyme elevation persists or fails to improve then can further workup.   We will sign off for now, assuming her  liver enzymes continue to improve and she is feeling better. If not and enzyme elevation persists or she worsens please let us know and we can reassess her. Call with questions.  Jolly Mango, MD [facility]               Subjective  No further abdominal pain, N/V. Afebrile for > 24 hours. Diarrhea resolve. Appetite is not great   Objective   Imaging:  US Abdomen Limited RUQ (LIVER/GB)  Result Date: 09/30/2022 CLINICAL DATA:  Mildly and vomiting. EXAM: ULTRASOUND ABDOMEN LIMITED RIGHT UPPER QUADRANT COMPARISON:  CT dated 09/30/2022. FINDINGS: Gallbladder: No gallstones or wall thickening visualized. No sonographic Murphy sign noted by sonographer. Common bile duct: Diameter: 2 mm Liver: The liver is unremarkable. Portal vein is patent on color Doppler imaging with normal direction of blood flow towards the liver. Other: None. IMPRESSION: Unremarkable right upper quadrant ultrasound. Electronically Signed   By: Anner Crete M.D.   On: 09/30/2022 20:27   DG Chest 2 View  Result Date: 09/30/2022 CLINICAL DATA:  Fever EXAM: CHEST - 2 VIEW COMPARISON:  None Available. FINDINGS: Unchanged cardiomediastinal silhouette. There is no focal airspace consolidation. There is no pleural effusion or evidence of pneumothorax. There is no acute osseous abnormality. Globular calcifications overlies the right acromiohumeral interval IMPRESSION: No evidence of acute cardiopulmonary disease. Calcific tendinosis of the distal right rotator cuff. Electronically Signed   By: Maurine Simmering M.D.   On: 09/30/2022 16:09   CT  ABDOMEN PELVIS WO CONTRAST  Result Date: 09/30/2022 CLINICAL DATA:  Acute abdominal pain EXAM: CT ABDOMEN AND PELVIS WITHOUT CONTRAST TECHNIQUE: Multidetector CT imaging of the abdomen and pelvis was performed following the standard protocol without IV contrast. RADIATION DOSE REDUCTION: This exam was performed according to the departmental dose-optimization program  which includes automated exposure control, adjustment of the mA and/or kV according to patient size and/or use of iterative reconstruction technique. COMPARISON:  None Available. FINDINGS: Lower chest: Solid pulmonary nodule of the left lower lobe measuring 5 mm on series 5, image 7 Hepatobiliary: No focal liver abnormality. Gallbladder is distended with possible mild wall thickening, lack of IV contrast limits evaluation. No biliary ductal dilation. Pancreas: Unremarkable. No pancreatic ductal dilatation or surrounding inflammatory changes. Spleen: Normal in size without focal abnormality. Adrenals/Urinary Tract: Bilateral adrenal glands are unremarkable. No hydronephrosis or nephrolithiasis. Bladder is unremarkable. Stomach/Bowel: Stomach is within normal limits. Appendix appears normal. No evidence of bowel wall thickening, distention, or inflammatory changes. Vascular/Lymphatic: No significant vascular findings are present. No enlarged abdominal or pelvic lymph nodes. Reproductive: No adnexal masses. Other: No abdominal wall hernia or abnormality. No abdominopelvic ascites. Musculoskeletal: No acute or significant osseous findings. IMPRESSION: 1. Gallbladder is distended with possible mild wall thickening, lack of IV contrast limits evaluation. Correlate for symptoms of right upper quadrant pain and consider gallbladder ultrasound for further evaluation. 2. Solid pulmonary nodule of the left lower lobe measuring 5 mm. No follow-up needed if patient is low-risk.This recommendation follows the consensus statement: Guidelines for Management of Incidental Pulmonary Nodules Detected on CT Images: From the Fleischner Society 2017; Radiology 2017; 284:228-243. Electronically Signed   By: Yetta Glassman M.D.   On: 09/30/2022 15:49   CT HEAD WO CONTRAST (5MM)  Result Date: 09/30/2022 CLINICAL DATA:  Hemorrhagic stroke follow-up. EXAM: CT HEAD WITHOUT CONTRAST TECHNIQUE: Contiguous axial images were obtained from  the base of the skull through the vertex without intravenous contrast. RADIATION DOSE REDUCTION: This exam was performed according to the departmental dose-optimization program which includes automated exposure control, adjustment of the mA and/or kV according to patient size and/or use of iterative reconstruction technique. COMPARISON:  CT head dated September 28, 2022. FINDINGS: Brain: Right thalamic hemorrhage has slightly decreased in size and density since the prior study. Surrounding edema is similar. No new infarct, hydrocephalus, extra-axial collection or mass lesion. No midline shift. Vascular: No hyperdense vessel or unexpected calcification. Skull: Normal. Negative for fracture or focal lesion. Sinuses/Orbits: No acute finding. Other: None. IMPRESSION: 1. Slightly decreased size and density of the right thalamic hemorrhage. No new acute finding. Electronically Signed   By: Titus Dubin M.D.   On: 09/30/2022 15:42   DG Abd 1 View  Result Date: 09/30/2022 CLINICAL DATA:  Nausea vomiting EXAM: ABDOMEN - 1 VIEW COMPARISON:  09/29/2022 FINDINGS: The bowel gas pattern is normal. No radio-opaque calculi or other significant radiographic abnormality are seen. IMPRESSION: Negative. Electronically Signed   By: Sammie Bench M.D.   On: 09/30/2022 12:59   DG Abd 1 View  Result Date: 09/29/2022 CLINICAL DATA:  [id number] Emesis [id number] EXAM: ABDOMEN - 1 VIEW COMPARISON:  None Available. FINDINGS: Gaseous distension of colon. No dilated loops of small bowel are visualized. Air is visualized in the rectum. Pelvic phleboliths. IMPRESSION: Nonobstructive bowel gas pattern. If persistent concern for obstructive physiology, recommend dedicated CT abdomen pelvis with contrast. Electronically Signed   By: Valentino Saxon M.D.   On: 09/29/2022 16:51   CT HEAD WO CONTRAST (5MM)  Result Date: 09/29/2022 CLINICAL DATA:  Worsening headache EXAM: CT HEAD WITHOUT CONTRAST TECHNIQUE: Contiguous axial images were obtained  from the base of the skull through the vertex without intravenous contrast. RADIATION DOSE REDUCTION: This exam was performed according to the departmental dose-optimization program which includes automated exposure control, adjustment of the mA and/or kV according to patient size and/or use of iterative reconstruction technique. 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 Likely unchanged surrounding edema and mild regional mass effect. No acute infarct, new hemorrhage, mass, or midline shift. No hydrocephalus or extra-axial collection. Vascular: No hyperdense vessel. Skull: Negative for fracture or focal lesion. Sinuses/Orbits: Clear paranasal sinuses. No acute finding in the orbits. Other: The mastoids are well aerated. IMPRESSION: 1. 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 2. No acute intracranial process. Electronically Signed   By: Wiliam Ke M.D.   On: 09/29/2022 01:43    Lab Results: Recent Labs    09/30/22 1303 10/01/22 0959 10/02/22 0706  WBC 10.2 8.0 9.1  HGB 13.7 12.5 12.6  HCT 37.6 36.4 35.4*  PLT 236 248 237   BMET Recent Labs    09/30/22 1440 10/01/22 0959 10/02/22 0706  NA 128* 132* 131*  K 3.1* 3.2* 3.0*  CL 98 103 101  CO2 19* 19* 20*  GLUCOSE 117* 116* 89  BUN 20 13 16   CREATININE 1.20* 0.87 0.85  CALCIUM 8.6* 8.5* 8.5*   LFT Recent Labs    10/01/22 0959 10/02/22 0706  PROT 5.9* 6.0*  ALBUMIN 2.9* 2.8*  AST 129* 90*  ALT 306* 273*  ALKPHOS 306* 340*  BILITOT 0.2* 0.4  BILIDIR 0.2  --   IBILI 0.0*  --    PT/INR Recent Labs    09/30/22 1440  LABPROT 15.3*  INR 1.2     Scheduled inpatient medications:   enoxaparin (LOVENOX) injection  40 mg Subcutaneous Q24H   fluticasone  1 spray Each Nare Daily    folic acid-pyridoxine-cyancobalamin  1 tablet Oral Daily   hydrALAZINE  100 mg Oral Q8H   loratadine  10 mg Oral Daily   losartan  100 mg Oral Daily   magnesium gluconate  500 mg Oral QHS   metoprolol tartrate  50 mg Oral BID   pantoprazole  40 mg Oral Daily   senna-docusate  2 tablet Oral Q supper   sertraline  50 mg Oral Daily   topiramate  50 mg Oral QHS   triamcinolone 0.1 % cream : eucerin   Topical TID   Continuous inpatient infusions:   sodium chloride 100 mL/hr at 10/02/22 0408   potassium chloride     PRN inpatient medications: acetaminophen, acetaminophen, alum & mag hydroxide-simeth, bisacodyl, diphenhydrAMINE, guaiFENesin-dextromethorphan, hydrALAZINE, metoprolol tartrate, ondansetron (ZOFRAN) IV, oxyCODONE, polyethylene glycol, prochlorperazine **OR** prochlorperazine **OR** prochlorperazine, sodium phosphate  Vital signs in last 24 hours: Temp:  [98.2 F (36.8 C)-99.1 F (37.3 C)] 98.7 F (37.1 C) (01/21 0857) Pulse Rate:  [80-101] 99 (01/21 0857) Resp:  [16-19] 16 (01/21 0857) BP: (118-201)/(59-100) 148/71 (01/21 0857) SpO2:  [94 %-100 %] 100 % (01/21 0857) Last BM Date : 10/01/22  Intake/Output Summary (Last 24 hours) at 10/02/2022 1030 Last data filed at 10/02/2022 1026 Gross per 24 hour  Intake 0 ml  Output 700 ml  Net -700 ml    Intake/Output from  previous day: 01/20 0701 - 01/21 0700 In: 240 [P.O.:240] Out: 700 [Urine:700] Intake/Output this shift: No intake/output data recorded.   Physical Exam:  General: Alert female in NAD Heart:  Regular rate and rhythm.  Pulmonary: Normal respiratory effort Abdomen: Soft, nondistended, nontender. Normal bowel sounds. Extremities: No lower extremity edema  Neurologic: Alert and oriented Psych: Pleasant. Cooperative.    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

## 2022-10-02 NOTE — Consult Note (Signed)
Kelly Stephenson  QQP:[id number] DOB: 1971-02-16 DOA: 09/20/2022 PCP: Birdie Sons, MD    Brief Narrative:  52 year old with a history of HTN but not on medications prior to admission who was transported to [facility] ER from work 09/16/2022 with the acute onset of difficulty speaking and left-sided weakness.  She was found to have a right basal ganglia ICH and ultimately required admission to the stroke team.  She was treated with a Cleviprex drip.  No surgical intervention was required.  Her bleeding was felt to have been hypertensive in nature.  She was ultimately transferred to inpatient rehab 09/20/2022.  While in rehab she has had intermittent complaints of headache, insomnia, and delirium.  Follow-up CT head has been without acute findings.  She has experienced gradually progressive decrease in sodium levels and increasing creatinine.  TRH was consulted to assist with evaluation of hyponatremia and acute kidney injury.  Goals of Care:  Code Status: Full Code   DVT prophylaxis: Per primary team (Rehab)  Interim Hx: Remains afebrile.  WBC normal.  Sodium relatively stable at 131.  Testing remains low.  LFTs continue to improve spontaneously.  Acute hepatitis panel negative.  Blood pressure within target range.  Vitals otherwise stable.  Reports that she is feeling better overall today.  Denies any new complaints.  Having some intermittent watery stool which began after she was given antibiotics per her history.  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 Titrate medical therapy as required to keep systolic blood pressure <712 - continue rehab on CIR  Malignant hypertension Blood pressure currently within target range -monitor without change in therapy today  Question of sepsis - no clinical evidence to support true sepsis  On 09/30/2022 patient was febrile with tachycardia and tachypnea -urinalysis was unrevealing -she complained of right upper quadrant pain  for which CT scan of the abdomen and pelvis suggested a distended gallbladder -she was placed on empiric antibiotics -right upper quadrant ultrasound was unrevealing -clinically today I find no evidence convincing of an active infection - continue to monitor w/o antibiotics for now   Transient loose stool Likely simply related to use of broad-spectrum antibiotic -antibiotic discontinued -give a short course of probiotic -Imodium as needed if persists  Acute kidney injury Appears to have been simply related to volume depletion - creatinine peaked at 1.5 - baseline creatinine approximately 1.0 -creatinine remains stable within normal range at this time  Hyponatremia Most likely simply related to hypovolemia, but in setting of recent ICH could be a more complex picture - while osmolality and spot urine Na studies suggestive of possible SIADH, her Na actually improved slightly w/ volume expansion - follow for now w/o change in tx on oral intake only   Hypokalemia Likely simply related to poor intake and increased GI loss -supplement and follow -magnesium not severely abnormal   Transaminitis Etiology unclear -GI has been consulted by the primary service -will defer management to GI -hepatitis panel unrevealing  Rash The patient had a diffuse lacy erythematous rash across her trunk and arms consistent with a drug rash, likely related to antibiotics - discontinued antibiotic - no evidence of more severe allergic response -rash much improved today  Generalized headache Likely being driven by elevated blood pressure as well as recent ICH - follow-up CT head without acute findings  HLD Hold statin in setting of elevated LFTs  Incidentally noted 5 mm pulmonary nodule No follow-up needed as patient is low risk per  radiology guidelines  Family Communication: Spoke with husband at bedside  Objective: Blood pressure (!) 148/71, pulse 99, temperature 98.7 F (37.1 C), temperature source Oral, resp.  rate 16, height 5\' 4"  (1.626 m), weight 87.8 kg, SpO2 100 %.  Intake/Output Summary (Last 24 hours) at 10/02/2022 1009 Last data filed at 10/02/2022 0641 Gross per 24 hour  Intake 0 ml  Output 700 ml  Net -700 ml    Filed Weights   09/20/22 1443  Weight: 87.8 kg    Examination: General: No acute respiratory distress Lungs: Clear to auscultation bilaterally -no focal crackles Cardiovascular: Regular rate and rhythm -no murmur or rub Abdomen: Nontender, nondistended, soft, bowel sounds positive, no rebound Extremities: No significant edema bilateral lower extremities  CBC: Recent Labs  Lab 09/30/22 1303 10/01/22 0959 10/02/22 0706  WBC 10.2 8.0 9.1  NEUTROABS 9.0* 6.4  --   HGB 13.7 12.5 12.6  HCT 37.6 36.4 35.4*  MCV 79.2* 82.2 80.3  PLT 236 248 709    Basic Metabolic Panel: Recent Labs  Lab 09/29/22 1059 09/30/22 0634 09/30/22 1440 10/01/22 0959 10/02/22 0706  NA 130*   < > 128* 132* 131*  K 3.5   < > 3.1* 3.2* 3.0*  CL 99   < > 98 103 101  CO2 21*   < > 19* 19* 20*  GLUCOSE 134*   < > 117* 116* 89  BUN 26*   < > 20 13 16   CREATININE 1.85*   < > 1.20* 0.87 0.85  CALCIUM 9.0   < > 8.6* 8.5* 8.5*  MG 2.1  --   --   --  1.8   < > = values in this interval not displayed.    GFR: Estimated Creatinine Clearance: 83.9 mL/min (by C-G formula based on SCr of 0.85 mg/dL).   Scheduled Meds:  enoxaparin (LOVENOX) injection  40 mg Subcutaneous Q24H   fluticasone  1 spray Each Nare Daily   folic acid-pyridoxine-cyancobalamin  1 tablet Oral Daily   hydrALAZINE  100 mg Oral Q8H   loratadine  10 mg Oral Daily   losartan  100 mg Oral Daily   magnesium gluconate  500 mg Oral QHS   metoprolol tartrate  50 mg Oral BID   pantoprazole  40 mg Oral Daily   potassium chloride  40 mEq Oral Once   potassium chloride  40 mEq Oral BID   senna-docusate  2 tablet Oral Q supper   sertraline  50 mg Oral Daily   topiramate  50 mg Oral QHS   triamcinolone 0.1 % cream : eucerin    Topical TID   Continuous Infusions:  sodium chloride 100 mL/hr at 10/02/22 0408     LOS: 12 days   Cherene Altes, MD [facility] Office  [phone] Pager - Text Page per Shea Evans  If 7PM-7AM, please contact night-coverage per Amion 10/02/2022, 10:09 AM

## 2022-10-02 NOTE — Progress Notes (Addendum)
Patient crying out in pain from burning sensation K infusion to LUE;RN decreased rate from 100<>92ml/hr. Patient continued to c/o of LUE burning sensation, decreased rate to 9ml/hr. Continued complaints, IV flushed and rate decreased to  36ml/hr. MD notified

## 2022-10-02 NOTE — Progress Notes (Signed)
Left FA PIV infiltrated;IV stopped, removed. LUE elevated and cold compress applied

## 2022-10-02 NOTE — Progress Notes (Signed)
Physical Therapy Session Note  Patient Details  Name: Kelly Stephenson MRN: [id number] Date of Birth: 1971-05-17   Attempted to see patient for unscheduled therapy to make up missed minutes. Patient in bed and refuses to participate, saying she doesn't feel well. Unable to encourage her to participate.       Therapy/Group: Individual Therapy  Saafir Abdullah P Kayvion Arneson PT 10/02/2022, 10:01 AM

## 2022-10-03 LAB — COMPREHENSIVE METABOLIC PANEL
ALT: 249 U/L — ABNORMAL HIGH (ref 0–44)
AST: 66 U/L — ABNORMAL HIGH (ref 15–41)
Albumin: 2.7 g/dL — ABNORMAL LOW (ref 3.5–5.0)
Alkaline Phosphatase: 319 U/L — ABNORMAL HIGH (ref 38–126)
Anion gap: 9 (ref 5–15)
BUN: 12 mg/dL (ref 6–20)
CO2: 21 mmol/L — ABNORMAL LOW (ref 22–32)
Calcium: 8.8 mg/dL — ABNORMAL LOW (ref 8.9–10.3)
Chloride: 103 mmol/L (ref 98–111)
Creatinine, Ser: 0.82 mg/dL (ref 0.44–1.00)
GFR, Estimated: >60 mL/min (ref >60)
Glucose, Bld: 91 mg/dL (ref 70–99)
Potassium: 3.9 mmol/L (ref 3.5–5.1)
Sodium: 133 mmol/L — ABNORMAL LOW (ref 135–145)
Total Bilirubin: 0.3 mg/dL (ref 0.3–1.2)
Total Protein: 5.9 g/dL — ABNORMAL LOW (ref 6.5–8.1)

## 2022-10-03 MED ORDER — POTASSIUM CHLORIDE CRYS ER 10 MEQ PO TBCR
10.0000 meq | EXTENDED_RELEASE_TABLET | Freq: Once | ORAL | Status: AC
  Administered 2022-10-03: 10 meq via ORAL
  Filled 2022-10-03: qty 1

## 2022-10-03 MED ORDER — POTASSIUM CHLORIDE 20 MEQ PO PACK: 20.0000 meq | PACK | Freq: Every day | ORAL | Status: DC

## 2022-10-03 NOTE — Progress Notes (Signed)
**  Late Entry**  09/30/22 1817  Assess: MEWS Score  Temp 98.4 F (36.9 C)  BP (!) 146/80  MAP (mmHg) 97  Pulse Rate (!) 101  Resp 18  SpO2 97 %  O2 Device Room Air  Assess: MEWS Score  MEWS Temp 0  MEWS Systolic 0  MEWS Pulse 1  MEWS RR 0  MEWS LOC 0  MEWS Score 1  MEWS Score Color Green  Assess: if the MEWS score is Yellow or Red  Were vital signs taken at a resting state? Yes  Focused Assessment No change from prior assessment  Does the patient meet 2 or more of the SIRS criteria? No  Does the patient have a confirmed or suspected source of infection? No  MEWS guidelines implemented *See Row Information* No, vital signs rechecked  Treat  Pain Scale 0-10  Pain Score Asleep  Notify: Charge Nurse/RN  Name of Charge Nurse/RN Notified Mekides, RN  Date Charge Nurse/RN Notified 09/30/22  Time Charge Nurse/RN Notified 1820  Provider Notification  Provider Name/Title Love, PA-C  Date Provider Notified 09/30/22  Time Provider Notified 7026  Method of Notification Face-to-face  Provider response In department  Date of Provider Response 09/30/22  Time of Provider Response 1830  Document  Patient Outcome Stabilized after interventions  Progress note created (see row info) Yes  Assess: SIRS CRITERIA  SIRS Temperature  0  SIRS Pulse 1  SIRS Respirations  0  SIRS WBC 0  SIRS Score Sum  1

## 2022-10-03 NOTE — Consult Note (Signed)
Kelly Stephenson  JJO:[id number] DOB: Apr 03, 1971 DOA: 09/20/2022 PCP: Birdie Sons, MD    Brief Narrative:  52 year old with a history of HTN but not on medications prior to admission who was transported to [facility] ER from work 09/16/2022 with the acute onset of difficulty speaking and left-sided weakness.  She was found to have a right basal ganglia ICH and ultimately required admission to the stroke team.  She was treated with a Cleviprex drip.  No surgical intervention was required.  Her bleeding was felt to have been hypertensive in nature.  She was ultimately transferred to inpatient rehab 09/20/2022.  While in rehab she has had intermittent complaints of headache, insomnia, and delirium.  Follow-up CT head has been without acute findings.  She has experienced gradually progressive decrease in sodium levels and increasing creatinine.  TRH was consulted to assist with evaluation of hyponatremia and acute kidney injury.  Goals of Care:  Code Status: Full Code   DVT prophylaxis: Per primary team (Rehab)  Interim Hx: No acute events recorded overnight.  Remains afebrile.  Vital signs stable.  Sodium improving.  Potassium normalized.  LFTs continue to trend downward.  The patient tells me she feels much better today.  She denies chest pain abdominal pain or shortness of breath.  She reports improved oral intake.  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 Titrate medical therapy as required to keep systolic blood pressure <160 - continue rehab on CIR  Malignant hypertension Blood pressure currently within target range   Question of sepsis - no clinical evidence to support true sepsis  On 09/30/2022 patient was febrile with tachycardia and tachypnea -urinalysis was unrevealing -she complained of right upper quadrant pain for which CT scan of the abdomen and pelvis suggested a distended gallbladder -she was placed on empiric antibiotics - R UQ abdom US was  unrevealing - in f/u on subsequent days I found no evidence convincing of an active infection - antibiotics discontinued - pt is presently doing well   Transient loose stool Likely simply related to use of broad-spectrum antibiotic -antibiotic discontinued -give a short course of probiotic -Imodium as needed if persists  Acute kidney injury Appears to have been simply related to volume depletion - creatinine peaked at 1.5 - baseline creatinine approximately 1.0 -creatinine remains stable within normal range at this time  Hyponatremia Most likely simply related to hypovolemia, but in setting of recent ICH could be a more complex picture - while osmolality and spot urine Na studies suggestive of possible SIADH, her Na actually improved slightly w/ volume expansion - given continued improvement in Na after IVF this does not appear to be SIADH, but instead likely simply hypovolemic hyponatremia - encourage consistent diet and fluid intake - check Na intermittently   Hypokalemia Likely simply related to poor intake and increased GI loss -corrected with supplementation  Transaminitis Etiology unclear -GI has been consulted by the primary service -will defer management to GI -hepatitis panel unrevealing -LFTs improving  Rash The patient had a diffuse lacy erythematous rash across her trunk and arms consistent with a drug rash, likely related to antibiotics - discontinued antibiotic - no evidence of more severe allergic response -rash much improved after discontinuation of antibiotics  Generalized headache Likely being driven by elevated blood pressure as well as recent ICH - follow-up CT head without acute findings -dehydration also likely contributed  HLD Hold statin in setting of elevated LFTs -consider resuming in 1 week if  LFTs have fully recovered but will need recheck of LFTs after resumption  Incidentally noted 5 mm pulmonary nodule No follow-up needed as patient is low risk per radiology  guidelines  In that the patient has stabilized medically, TRH will now sign off.  Please feel free to call again if there is any way we can assist in the care of this pleasant lady.  Objective: Blood pressure (!) 152/93, pulse 87, temperature 98.1 F (36.7 C), resp. rate 18, height 5\' 4"  (1.626 m), weight 87.8 kg, SpO2 100 %.  Intake/Output Summary (Last 24 hours) at 10/03/2022 1017 Last data filed at 10/02/2022 1026 Gross per 24 hour  Intake 0 ml  Output --  Net 0 ml    Filed Weights   09/20/22 1443  Weight: 87.8 kg    Examination: General: No acute respiratory distress Lungs: Clear to auscultation bilaterally -no focal crackles Cardiovascular: Regular rate and rhythm without murmur Abdomen: NT/ND, soft, bowel sounds positive, no rebound Extremities: No significant edema bilateral lower extremities  CBC: Recent Labs  Lab 09/30/22 1303 10/01/22 0959 10/02/22 0706  WBC 10.2 8.0 9.1  NEUTROABS 9.0* 6.4  --   HGB 13.7 12.5 12.6  HCT 37.6 36.4 35.4*  MCV 79.2* 82.2 80.3  PLT 236 248 947    Basic Metabolic Panel: Recent Labs  Lab 09/29/22 1059 09/30/22 0634 10/01/22 0959 10/02/22 0706 10/03/22 0629  NA 130*   < > 132* 131* 133*  K 3.5   < > 3.2* 3.0* 3.9  CL 99   < > 103 101 103  CO2 21*   < > 19* 20* 21*  GLUCOSE 134*   < > 116* 89 91  BUN 26*   < > 13 16 12   CREATININE 1.85*   < > 0.87 0.85 0.82  CALCIUM 9.0   < > 8.5* 8.5* 8.8*  MG 2.1  --   --  1.8  --    < > = values in this interval not displayed.    GFR: Estimated Creatinine Clearance: 87 mL/min (by C-G formula based on SCr of 0.82 mg/dL).   Scheduled Meds:  amLODipine  10 mg Oral Daily   enoxaparin (LOVENOX) injection  40 mg Subcutaneous Q24H   fluticasone  1 spray Each Nare Daily   folic acid-pyridoxine-cyancobalamin  1 tablet Oral Daily   loratadine  10 mg Oral Daily   losartan  100 mg Oral Daily   magnesium gluconate  500 mg Oral QHS   metoprolol tartrate  100 mg Oral BID   pantoprazole   40 mg Oral Daily   potassium chloride  40 mEq Oral BID   saccharomyces boulardii  250 mg Oral BID   senna-docusate  2 tablet Oral Q supper   sertraline  50 mg Oral Daily   topiramate  50 mg Oral QHS   triamcinolone 0.1 % cream : eucerin   Topical TID      LOS: 13 days   Cherene Altes, MD [facility] Office  [phone] Pager - Text Page per Shea Evans  If 7PM-7AM, please contact night-coverage per Amion 10/03/2022, 10:17 AM

## 2022-10-03 NOTE — Progress Notes (Signed)
**  Late Entry**  09/30/22 1408  Assess: MEWS Score  Temp (!) 101.9 F (38.8 C)  BP (!) 198/92  MAP (mmHg) 119  Pulse Rate (!) 125  Resp (!) 28  Level of Consciousness Alert  SpO2 95 %  O2 Device Room Air  Assess: MEWS Score  MEWS Temp 2  MEWS Systolic 0  MEWS Pulse 2  MEWS RR 2  MEWS LOC 0  MEWS Score 6  MEWS Score Color Red  Assess: if the MEWS score is Yellow or Red  Were vital signs taken at a resting state? Yes  Focused Assessment No change from prior assessment  Does the patient meet 2 or more of the SIRS criteria? Yes  Does the patient have a confirmed or suspected source of infection? No  MEWS guidelines implemented *See Row Information* Yes  Treat  MEWS Interventions Other (Comment) (consulted rapid response)  Pain Scale 0-10  Pain Score 10  Pain Type Acute pain  Pain Location Head  Pain Descriptors / Indicators Headache  Pain Frequency Constant  Pain Onset On-going  Patients Stated Pain Goal 4  Pain Intervention(s) MD notified (Comment)  Interventions Dark room;Relaxation  Take Vital Signs  Increase Vital Sign Frequency  Red: Q 1hr X 4 then Q 4hr X 4, if remains red, continue Q 4hrs  Escalate  MEWS: Escalate Red: discuss with charge nurse/RN and provider, consider discussing with RRT  Notify: Charge Nurse/RN  Name of Charge Nurse/RN Notified Mekides, RN  Date Charge Nurse/RN Notified 09/30/22  Time Charge Nurse/RN Notified 1410  Provider Notification  Provider Name/Title Love  Date Provider Notified 09/30/22  Time Provider Notified 1418  Method of Notification Face-to-face  Provider response See new orders  Date of Provider Response 09/30/22  Time of Provider Response 1418  Notify: Rapid Response  Name of Rapid Response RN Notified Loma Boston, RN  Date Rapid Response Notified 09/30/22  Time Rapid Response Notified 1400  Document  Patient Outcome Other (Comment) (Sepsis protocol initiated)  Progress note created (see row info) Yes  Assess: SIRS  CRITERIA  SIRS Temperature  1  SIRS Pulse 1  SIRS Respirations  1  SIRS WBC 0  SIRS Score Sum  3

## 2022-10-03 NOTE — Progress Notes (Signed)
PROGRESS NOTE   Subjective/Complaints: Sleepy this morning with husband beside her IV infiltrated yesterday and cold compress was applied No issues overnight  ROS: Limited by somnolence Objective:   No results found. Recent Labs    10/01/22 0959 10/02/22 0706  WBC 8.0 9.1  HGB 12.5 12.6  HCT 36.4 35.4*  PLT 248 237    Recent Labs    10/02/22 0706 10/03/22 0629  NA 131* 133*  K 3.0* 3.9  CL 101 103  CO2 20* 21*  GLUCOSE 89 91  BUN 16 12  CREATININE 0.85 0.82  CALCIUM 8.5* 8.8*    No intake or output data in the 24 hours ending 10/03/22 1029       Physical Exam: Vital Signs Blood pressure (!) 152/93, pulse 87, temperature 98.1 F (36.7 C), resp. rate 18, height 5\' 4"  (1.626 m), weight 87.8 kg, SpO2 100 %.   General: awake, alert, appropriate, but sleepy; NAD HENT: conjugate gaze; oropharynx moist CV: regular rate; no JVD Pulmonary: CTA B/L; no W/R/R- good air movement GI: soft, less TTP- ND; hypoactive BS, loose stools have resolved Psychiatric: appropriate- sleepy Neurological: Ox3  Ext: no clubbing, cyanosis, or edema Psych: flat, does awaken briefly and is cooperative. Skin: No evidence of breakdown, no evidence of rash Neurologic: slow to arouse. Follows commands.  Right sided motor strength is grossly 5/5,    LUE 3- at deltoid , bi, tri, grip, 3- Left HF, KE, 2- ADF  Sensory exam - pt does not attend consistently to participate Left lower back TTP     Assessment/Plan: 1. Functional deficits which require 3+ hours per day of interdisciplinary therapy in a comprehensive inpatient rehab setting. Physiatrist is providing close team supervision and 24 hour management of active medical problems listed below. Physiatrist and rehab team continue to assess barriers to discharge/monitor patient progress toward functional and medical goals  Care Tool:  Bathing    Body parts bathed by patient:  Left arm, Chest, Abdomen, Front perineal area, Buttocks, Right upper leg, Left upper leg, Right lower leg, Face   Body parts bathed by helper: Left lower leg, Right arm     Bathing assist Assist Level: Minimal Assistance - Patient > 75%     Upper Body Dressing/Undressing Upper body dressing   What is the patient wearing?: Pull over shirt    Upper body assist Assist Level: Set up assist    Lower Body Dressing/Undressing Lower body dressing      What is the patient wearing?: Pants, Incontinence brief     Lower body assist Assist for lower body dressing: Moderate Assistance - Patient 50 - 74%     Toileting Toileting    Toileting assist Assist for toileting: Minimal Assistance - Patient > 75%     Transfers Chair/bed transfer  Transfers assist     Chair/bed transfer assist level: Minimal Assistance - Patient > 75%     Locomotion Ambulation   Ambulation assist      Assist level: Moderate Assistance - Patient 50 - 74% Assistive device: Parallel bars Max distance: 8 ft   Walk 10 feet activity   Assist  Walk 10 feet activity did not occur:  Safety/medical concerns        Walk 50 feet activity   Assist Walk 50 feet with 2 turns activity did not occur: Safety/medical concerns         Walk 150 feet activity   Assist Walk 150 feet activity did not occur: Safety/medical concerns         Walk 10 feet on uneven surface  activity   Assist Walk 10 feet on uneven surfaces activity did not occur: Safety/medical concerns         Wheelchair     Assist Is the patient using a wheelchair?: Yes Type of Wheelchair: Manual    Wheelchair assist level: Dependent - Patient 0%      Wheelchair 50 feet with 2 turns activity    Assist        Assist Level: Dependent - Patient 0%   Wheelchair 150 feet activity     Assist      Assist Level: Dependent - Patient 0%   Blood pressure (!) 152/93, pulse 87, temperature 98.1 F (36.7 C),  resp. rate 18, height 5\' 4"  (1.626 m), weight 87.8 kg, SpO2 100 %.    Medical Problem List and Plan: 1. Functional deficits secondary to L hemiparesis from R Basal ganglia ICH             -patient may  shower             -ELOS/Goals: 18-21 days min A- PT, OT and SLP  Continue CIR- PT, OT and SLP  Head CT reviewed and shows decrease in size of bleed   2.  Antithrombotics: -DVT/anticoagulation:  Pharmaceutical: Lovenox             -antiplatelet therapy: N/A 3. Headache: Scheduled topamax 100mg  HS. On oxycodone and Fioricet prn.              --will start sleep chart.               4. Mood/Behavior/Sleep: LCSW to follow for evaluation and support.  --Has a lot of stress, anxiety, insomnia-->hasn't wanted to get help per husband.  --chronic insomnia-->up/down multiple times during the night. --antipsychotic agents: N/A 5. Neuropsych/cognition: This patient is capable of making decisions on her own behalf. 6. Skin/Wound Care: Routine pressure relief measures.  7. Fluids/Electrolytes/Nutrition: encourage PO -BUN still elevated 26 but trending down from 1/12 lab (30). 8. HTN: Monitor BP TID. Was not compliant with medications--does not want meds/doesn't like meds, so was noncompliant at home with meds prescribed.  -d/c amlodipine, increase magnesium to 500mg  HS --On Norvasc, Cozaar, Hydralazine and Lopressor.             --SBP goal 130-150 range.   -ordered IV hydralazine 10mg  for SBP >160 9. Hyperlipidemia: LDL 206- on Crestor. 10. Hyponatremia: Question SIADH. Recheck sodium today.  11. Leucocytosis: Likely reactive--still sl elevated  -afebrile --No signs of infection.  12. Acute on chronic Headaches w/ nausea: Took 4 aleve daily.  --Will add Claritin and nose spray to help with congestion as could be exacerbating HA --Increased topamax to 100 mg HS. Added magnesium gluconate 250mg  HS 13. B12 deficiency: given IM B12 given 1/11, discussed that she may benefit from these monthly, or  increasing consumption of meat.  14. Left wrist pain: discussed that XR results are negative 15. AKI: Cr elevated, IVF started.  16. Pseudobulbar affect: Nudexta discontinued since patient started feeling nauseous after it was given 17. Insomnia: d/c trazodone, decrease topamax to 50mg , add melatonin 3mg   HS 18. Hypokalemia: improved, monitor daily and supplement to goal of 4 19. Daytime somnolence: d/c klonopin and daytime topamax 20. Hyponatremia: improving, monitor daily.  21. Nausea: IV zofran ordered prn, improved 22. Sepsis- Cholecystitis: IV zosyn and vanc started 1/19. Fever resolved. CXR reviewed and stable. Lactic acid normal. US abdomen reviewed and is unremarkable. F/u blood and urine cultures  1/20- U Cx (-)- and blood Cx's so far Negative; AST/ALT down 50+ points and WBC down to 8.0k- con't to monitor-- s/p 1 dose of Vac and Zosyn  Antibiotics d/ced.     LOS: 13 days A FACE TO FACE EVALUATION WAS PERFORMED  Clide Deutscher Daeshawn Redmann 10/03/2022, 10:29 AM

## 2022-10-03 NOTE — Progress Notes (Signed)
Physical Therapy Session Note  Patient Details  Name: Kelly Stephenson MRN: [id number] Date of Birth: 1971/04/24  Today's Date: 10/03/2022 PT Individual Time: 1140-1215 PT Individual Time Calculation (min): 35 min   Short Term Goals: Week 1:  PT Short Term Goal 1 (Week 1): Pt will complete bed mobility with minA PT Short Term Goal 1 - Progress (Week 1): Met PT Short Term Goal 2 (Week 1): Pt will complete bed<>chair transfers with minA PT Short Term Goal 2 - Progress (Week 1): Met PT Short Term Goal 3 (Week 1): Pt will ambulate 35ft with minA and LRAD PT Short Term Goal 3 - Progress (Week 1): Partly met Week 2:  PT Short Term Goal 1 (Week 2): Pt will complete bed mobility with CGA PT Short Term Goal 2 (Week 2): Pt will complete bed<>chair transfers with CGA and LRAD PT Short Term Goal 3 (Week 2): Pt will ambulate 88ft with minA and LRAD PT Short Term Goal 4 (Week 2): Pt will navigate up/down x4 steps with minA and LRAD  Skilled Therapeutic Interventions/Progress Updates:  Patient sidelying to L side on entrance to room. Mother present. Patient alert and agreeable to PT session.   Patient with no pain complaint at start of session.  Therapeutic Activity: Bed Mobility: Pt performed supine --> sit using log roll technique. With vc, she relates she cannot perform each step but then is able to complete with Min/ ModA. VC/ tc required for sequencing technique. Transfers: Pt performed lateral scoot with CGA/ MinA to R side and then squat pivot to w/c to R side with MinA.   Sit<>stand at EOB with no AD and pt very unsure of balance with flexed posture. Reaching out to grab therapist to steady self and reach upright posture. Provided verbal cues for increased effort to maintain L knee extension.  Neuromuscular Re-ed: NMR facilitated during session with focus on standing balance and LLE muscle activation. Pt guided in biased sit<>stands and minisquats with no AD. 2" step placed under RLE in  order to bias increased use of LLE. Poor self efficacy and confidence leads pt to continue to relates throughout "I can't" and then performs well. Initially requiring ModA to maintain upright posture. Is completing minisquats with guard to L knee in eccentric lowering only. Performs overall with MinA to no AD. Mirror used to reinforce pt's understanding of level of assist provided and to build confidence in abilities.   NMR performed for improvements in motor control and coordination, balance, sequencing, judgement, and self confidence/ efficacy in performing all aspects of mobility at highest level of independence.   Patient seated upright in w/c at end of session with brakes locked, belt alarm set, and all needs within reach. Ready for lunch tray placed on table in front of her.    Therapy Documentation Precautions:  Precautions Precautions: Fall Precaution Comments: L hemi Restrictions Weight Bearing Restrictions: No General:   Vital Signs: Therapy Vitals Temp: 98.1 F (36.7 C) Pulse Rate: 87 Resp: 18 BP: (!) 152/93 Patient Position (if appropriate): Sitting Oxygen Therapy SpO2: 100 % O2 Device: Room Air Pain: Pain Assessment Pain Scale: 0-10 Pain Score: 0-No pain  Therapy/Group: Individual Therapy  Alger Simons PT, DPT, CSRS 10/03/2022, 12:53 PM

## 2022-10-03 NOTE — Progress Notes (Signed)
Occupational Therapy Session Note  Patient Details  Name: Kelly Stephenson MRN: [id number] Date of Birth: September 11, 1971  Today's Date: 10/03/2022 OT Individual Time: 7939-0300 OT Individual Time Calculation (min): 43 min    Short Term Goals: Week 2:  OT Short Term Goal 1 (Week 2): Patient will don upper body clothing with set up and cueing OT Short Term Goal 2 (Week 2): Patient wilL utilize LUE to aide with hygiene and self feeding with only initial cueing OT Short Term Goal 3 (Week 2): Patient will tie shoes with min assist using bumanual technique OT Short Term Goal 4 (Week 2): Patient will complete toilet/ shower trasnfer with contact guard assist OT Short Term Goal 5 (Week 2): Patient will complete toileting with contact guard and cueing  Skilled Therapeutic Interventions/Progress Updates:    Patient received supine and sleeping in bed.  Patient arousable - stating she is feeling better, reports significant improvement in headache.  Patient agreeable to get out of bed!  Transferred to wheelchair - squat pivot with min assist.  Indicated need to void - transferred to toilet with min assist.  Patient reports medicine is "chewing my belly up."  Patient indicated trouble this weekend with IV infiltrate.   Patient toileted self with mod assist.  Dressed herself with mod upper body, max lower body.  Transported to sink to complete hygiene.  Patient with decreased endurance, but working to stay up and out of bed.  Patient left up in wheelchair with family present.  Call bell and personal items in reach - mom at bedside.    Therapy Documentation Precautions:  Precautions Precautions: Fall Precaution Comments: L hemi Restrictions Weight Bearing Restrictions: No  Vital Signs: Therapy Vitals Pulse Rate: 86 Resp: 18 BP: (!) 146/79 Patient Position (if appropriate): Sitting  Pain: Pain Assessment Pain Scale: 0-10 Pain Score: 0-No pain    Therapy/Group: Individual Therapy  [name] 10/03/2022, 12:23 PM

## 2022-10-03 NOTE — Progress Notes (Signed)
**  Late Entry**   09/30/22 1044  Assess: MEWS Score  Temp 98.6 F (37 C)  BP (!) 194/112  MAP (mmHg) 134  Pulse Rate (!) 124  Resp 20  SpO2 97 %  O2 Device Room Air  Assess: MEWS Score  MEWS Temp 0  MEWS Systolic 0  MEWS Pulse 2  MEWS RR 0  MEWS LOC 0  MEWS Score 2  MEWS Score Color Yellow  Assess: if the MEWS score is Yellow or Red  Were vital signs taken at a resting state? Yes  Focused Assessment No change from prior assessment  Does the patient meet 2 or more of the SIRS criteria? No  MEWS guidelines implemented *See Row Information* No, vital signs rechecked  Take Vital Signs  Increase Vital Sign Frequency  Yellow: Q 2hr X 2 then Q 4hr X 2, if remains yellow, continue Q 4hrs  Escalate  MEWS: Escalate Yellow: discuss with charge nurse/RN and consider discussing with provider and RRT  Notify: Charge Nurse/RN  Name of Charge Nurse/RN Notified Mekides, RN  Date Charge Nurse/RN Notified 09/30/22  Time Charge Nurse/RN Notified 1044  Provider Notification  Provider Name/Title Raulkar and Love  Date Provider Notified 09/30/22  Time Provider Notified 1045  Method of Notification Face-to-face  Notification Reason Change in status  Provider response See new orders  Date of Provider Response 09/30/22  Time of Provider Response 1050  Document  Patient Outcome Other (Comment) (Remaining on unit for now)  Progress note created (see row info) Yes  Assess: SIRS CRITERIA  SIRS Temperature  0  SIRS Pulse 1  SIRS Respirations  0  SIRS WBC 0  SIRS Score Sum  1

## 2022-10-03 NOTE — Progress Notes (Signed)
**  Late Entry**   09/30/22 1326  Assess: MEWS Score  Temp (!) 102.3 F (39.1 C)  BP (!) 170/103  MAP (mmHg) 124  Pulse Rate (!) 127  Resp 20  SpO2 95 %  Assess: MEWS Score  MEWS Temp 2  MEWS Systolic 0  MEWS Pulse 2  MEWS RR 0  MEWS LOC 0  MEWS Score 4  MEWS Score Color Red  Assess: if the MEWS score is Yellow or Red  Were vital signs taken at a resting state? Yes  Focused Assessment Change from prior assessment (see assessment flowsheet)  Does the patient meet 2 or more of the SIRS criteria? No  MEWS guidelines implemented *See Row Information* No, vital signs rechecked  Treat  MEWS Interventions Administered prn meds/treatments  Pain Scale 0-10  Pain Score 10  Pain Type Acute pain  Pain Location Head  Pain Descriptors / Indicators Headache  Pain Frequency Constant  Pain Onset On-going  Patients Stated Pain Goal 4  Pain Intervention(s) Medication (See eMAR)  Multiple Pain Sites No  Take Vital Signs  Increase Vital Sign Frequency  Red: Q 1hr X 4 then Q 4hr X 4, if remains red, continue Q 4hrs  Escalate  MEWS: Escalate Red: discuss with charge nurse/RN and provider, consider discussing with RRT  Notify: Charge Nurse/RN  Name of Charge Nurse/RN Notified Mekides, RN  Date Charge Nurse/RN Notified 09/30/22  Time Charge Nurse/RN Notified 1330  Provider Notification  Provider Name/Title Love  Date Provider Notified 09/30/22  Time Provider Notified 1330  Method of Notification Face-to-face  Notification Reason Change in status  Provider response See new orders  Date of Provider Response 09/30/22  Time of Provider Response 1335  Document  Patient Outcome Other (Comment) (Scans ordered, xrays ordered)  Progress note created (see row info) Yes  Assess: SIRS CRITERIA  SIRS Temperature  1  SIRS Pulse 1  SIRS Respirations  0  SIRS WBC 0  SIRS Score Sum  2

## 2022-10-04 DIAGNOSIS — F411 Generalized anxiety disorder: Secondary | ICD-10-CM

## 2022-10-04 MED ORDER — SERTRALINE HCL 50 MG PO TABS
50.0000 mg | ORAL_TABLET | Freq: Every day | ORAL | Status: DC
  Administered 2022-10-05: 50 mg via ORAL
  Filled 2022-10-04: qty 1

## 2022-10-04 MED ORDER — TOPIRAMATE 25 MG PO TABS
25.0000 mg | ORAL_TABLET | Freq: Every day | ORAL | Status: DC
  Administered 2022-10-04: 25 mg via ORAL
  Filled 2022-10-04: qty 1

## 2022-10-04 NOTE — Progress Notes (Signed)
PROGRESS NOTE   Subjective/Complaints: Looking much better this morning Scored moderate cognitive deficits when working with SLP Had neuropsych eval today  ROS: Nausea improved Objective:   No results found. Recent Labs    10/02/22 0706  WBC 9.1  HGB 12.6  HCT 35.4*  PLT 237    Recent Labs    10/02/22 0706 10/03/22 0629  NA 131* 133*  K 3.0* 3.9  CL 101 103  CO2 20* 21*  GLUCOSE 89 91  BUN 16 12  CREATININE 0.85 0.82  CALCIUM 8.5* 8.8*     Intake/Output Summary (Last 24 hours) at 10/04/2022 1229 Last data filed at 10/04/2022 0847 Gross per 24 hour  Intake 480 ml  Output --  Net 480 ml         Physical Exam: Vital Signs Blood pressure 135/73, pulse 74, temperature 97.9 F (36.6 C), temperature source Oral, resp. rate 18, height 5\' 4"  (1.626 m), weight 87.8 kg, SpO2 100 %.   General: awake, alert, appropriate, but sleepy; NAD HENT: conjugate gaze; oropharynx moist CV: regular rate; no JVD Pulmonary: CTA B/L; no W/R/R- good air movement GI: soft, less TTP- ND; hypoactive BS, loose stools have resolved Psychiatric: appropriate- sleepy Neurological: Ox2  Ext: no clubbing, cyanosis, or edema Psych: flat, does awaken briefly and is cooperative. Skin: No evidence of breakdown, no evidence of rash Neurologic: slow to arouse. Follows commands.  Right sided motor strength is grossly 5/5,    LUE 3- at deltoid , bi, tri, grip, 3- Left HF, KE, 2- ADF  Sensory exam - pt does not attend consistently to participate Left lower back TTP     Assessment/Plan: 1. Functional deficits which require 3+ hours per day of interdisciplinary therapy in a comprehensive inpatient rehab setting. Physiatrist is providing close team supervision and 24 hour management of active medical problems listed below. Physiatrist and rehab team continue to assess barriers to discharge/monitor patient progress toward functional and  medical goals  Care Tool:  Bathing    Body parts bathed by patient: Left arm, Chest, Abdomen, Front perineal area, Buttocks, Right upper leg, Left upper leg, Right lower leg, Face   Body parts bathed by helper: Left lower leg, Right arm     Bathing assist Assist Level: Minimal Assistance - Patient > 75%     Upper Body Dressing/Undressing Upper body dressing   What is the patient wearing?: Pull over shirt    Upper body assist Assist Level: Set up assist    Lower Body Dressing/Undressing Lower body dressing      What is the patient wearing?: Pants, Incontinence brief     Lower body assist Assist for lower body dressing: Moderate Assistance - Patient 50 - 74%     Toileting Toileting    Toileting assist Assist for toileting: Minimal Assistance - Patient > 75%     Transfers Chair/bed transfer  Transfers assist     Chair/bed transfer assist level: Minimal Assistance - Patient > 75%     Locomotion Ambulation   Ambulation assist      Assist level: Moderate Assistance - Patient 50 - 74% Assistive device: Parallel bars Max distance: 8 ft   Walk  10 feet activity   Assist  Walk 10 feet activity did not occur: Safety/medical concerns        Walk 50 feet activity   Assist Walk 50 feet with 2 turns activity did not occur: Safety/medical concerns         Walk 150 feet activity   Assist Walk 150 feet activity did not occur: Safety/medical concerns         Walk 10 feet on uneven surface  activity   Assist Walk 10 feet on uneven surfaces activity did not occur: Safety/medical concerns         Wheelchair     Assist Is the patient using a wheelchair?: Yes Type of Wheelchair: Manual    Wheelchair assist level: Dependent - Patient 0%      Wheelchair 50 feet with 2 turns activity    Assist        Assist Level: Dependent - Patient 0%   Wheelchair 150 feet activity     Assist      Assist Level: Dependent - Patient 0%    Blood pressure 135/73, pulse 74, temperature 97.9 F (36.6 C), temperature source Oral, resp. rate 18, height 5\' 4"  (1.626 m), weight 87.8 kg, SpO2 100 %.    Medical Problem List and Plan: 1. 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  Impaired mobility: continue Lovenox for DVT ppx. Add SCDs 3. Headache: Decrease HS topamax to 25mg . On oxycodone and Fioricet prn.              --will start sleep chart.  4. Anxiety: change Zoloft to HS 5. Neuropsych/cognition: This patient is capable of making decisions on her own behalf. 6. Skin/Wound Care: Routine pressure relief measures.  7. Fluids/Electrolytes/Nutrition: encourage PO -BUN still elevated 26 but trending down from 1/12 lab (30). 8. HTN: Monitor BP TID. Was not compliant with medications--does not want meds/doesn't like meds, so was noncompliant at home with meds prescribed.  -d/c amlodipine, increase magnesium to 500mg  HS --On Norvasc, Cozaar, Hydralazine and Lopressor.             --SBP goal 130-150 range.   -ordered IV hydralazine 10mg  for SBP >160 9. Hyperlipidemia: LDL 206- on Crestor. 10. Hyponatremia: Question SIADH. Recheck sodium today.  11. Leucocytosis: Likely reactive--still sl elevated  -afebrile --No signs of infection.  12. Acute on chronic Headaches w/ nausea: Took 4 aleve daily.  --Will add Claritin and nose spray to help with congestion as could be exacerbating HA --Increased topamax to 100 mg HS. Added magnesium gluconate 250mg  HS 13. B12 deficiency: given IM B12 given 1/11, discussed that she may benefit from these monthly, or increasing consumption of meat.  14. Left wrist pain: discussed that XR results are negative 15. AKI: Cr  elevated, IVF started.  16. Pseudobulbar affect: Nudexta discontinued since patient started feeling nauseous after it was given 17. Insomnia: d/c trazodone, decrease topamax to 50mg , add melatonin 3mg  HS 18. Hypokalemia: improved, monitor daily and supplement to goal of 4 19. Daytime somnolence: d/c klonopin and daytime topamax 20.  Hyponatremia: improving, monitor daily.  21. Nausea: IV zofran ordered prn, improved 22. Sepsis- Cholecystitis: IV zosyn and vanc started 1/19. Fever resolved. CXR reviewed and stable. Lactic acid normal. US abdomen reviewed and is unremarkable. F/u blood and urine cultures  1/20- U Cx (-)- and blood Cx's so far Negative; AST/ALT down 50+ points and WBC down to 8.0k- con't to monitor-- s/p 1 dose of Vac and Zosyn  Antibiotics d/ced.     LOS: 14 days A FACE TO FACE EVALUATION WAS PERFORMED  Horton Chin 10/04/2022, 12:29 PM

## 2022-10-04 NOTE — Progress Notes (Signed)
Physical Therapy Session Note  Patient Details  Name: Kelly Stephenson MRN: [id number] Date of Birth: 1970/10/05  Today's Date: 10/04/2022 PT Individual Time: 0962-8366 PT Individual Time Calculation (min): 45 min   Short Term Goals: Week 2:  PT Short Term Goal 1 (Week 2): Pt will complete bed mobility with CGA PT Short Term Goal 2 (Week 2): Pt will complete bed<>chair transfers with CGA and LRAD PT Short Term Goal 3 (Week 2): Pt will ambulate 7ft with minA and LRAD PT Short Term Goal 4 (Week 2): Pt will navigate up/down x4 steps with minA and LRAD  Skilled Therapeutic Interventions/Progress Updates:      Pt resting in bed with her mother at the bedside. Pt awake and in agreement to therapy session. No reports of pain.   Donned shoes with totalA for time. Noted L Heel cord to be tight - provided stretching to tolerance and recommended adjustable night splint outside of therapies - MD messaged via secure chat.  Supine<>sitting EOB with minA for trunk support. Able to maintain sitting balance without LOB at EOB. Assisted to w/c with minA squat<>pivot transfer.   Transported to main rehab gym and assisted to mat table in similar manner. Pt inquiring on how to reduce flexor tone in LUE - educated on stretching biceps/pec's, and internal rotators to promtoe more neutral positioning. Spent some time stretching these in propped position, as well as LLE hamstring and figure-4 stretching.  NMR for sit<>stands, standing balance, postural awareness, and LLE weight shifting. Used mirror for visual feedback and placed tape vertically in the center to promote weight shifting left as she favors her RLE quite heavily. Unsupported standing with CGA/min guard - able to progress to mini squats with minA with PT facilitating L lateral shift to weight bear. Pt appears to be more clear cognitively with improved awareness of deficits and limitations.   Assisted back to her room and patient agreeable to stay  sitting in w/c at end of session. Safety belt alarm on and mother at bedside.  Therapy Documentation Precautions:  Precautions Precautions: Fall Precaution Comments: L hemi Restrictions Weight Bearing Restrictions: No General:    Therapy/Group: Individual Therapy  Matheu Ploeger P Dicy Smigel PT 10/04/2022, 7:35 AM

## 2022-10-04 NOTE — Progress Notes (Signed)
Speech Language Pathology Daily Session Note  Patient Details  Name: Kelly Stephenson MRN: [id number] Date of Birth: 05/21/1971  Today's Date: 10/04/2022 SLP Individual Time: 5035-4656 SLP Individual Time Calculation (min): 47 min  Short Term Goals: Week 2: SLP Short Term Goal 1 (Week 2): Patient will orient x4 with sup A for use of external aids. SLP Short Term Goal 2 (Week 2): Patient will utilize memory compensations to recall biographical and/or novel information with min-mod A verbal/visual cues SLP Short Term Goal 3 (Week 2): Patient will sustain attention to functional tasks for 7 minute intervals with min A verbal redirection cues SLP Short Term Goal 4 (Week 2): Patient will complete basic complex problem solving with min-mod A verbal/visual cues to achieve 80% accuracy SLP Short Term Goal 5 (Week 2): Patient will participate in further cognitive-linguistic evaluation to full completion for ongoing goal development  Skilled Therapeutic Interventions:   Pt seen for skilled SLP session to address cognitive goals. Pt able to complete 45 minute session, though needed rest breaks during second half of session. SLUMS completed. Pt scored 17/30 consistent with moderate cognitive deficits. Areas of cognition to work on include working memory, delayed recall, attention, and problem solving.   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 She would benefit from addressing scheduling tasks, financial management, organization, and other functional problem solving tasks.   Upon departure, pt left in bed with bed alarm on and family members at bedside. Call bell in reach. Recommend continue SLP PoC.    Pain Pain Assessment Pain Scale: 0-10 Pain Score: 0-No  pain  Therapy/Group: Individual Therapy  Wyn Forster 10/04/2022, 12:12 PM

## 2022-10-04 NOTE — Progress Notes (Signed)
Occupational Therapy Session Note  Patient Details  Name: Kelly Stephenson MRN: [id number] Date of Birth: Aug 12, 1971  Today's Date: 10/04/2022 OT Individual Time: 7425-9563 OT Individual Time Calculation (min): 45 min    Short Term Goals: Week 2:  OT Short Term Goal 1 (Week 2): Patient will don upper body clothing with set up and cueing OT Short Term Goal 2 (Week 2): Patient wilL utilize LUE to aide with hygiene and self feeding with only initial cueing OT Short Term Goal 3 (Week 2): Patient will tie shoes with min assist using bumanual technique OT Short Term Goal 4 (Week 2): Patient will complete toilet/ shower trasnfer with contact guard assist OT Short Term Goal 5 (Week 2): Patient will complete toileting with contact guard and cueing  Skilled Therapeutic Interventions/Progress Updates:    Patient received supine in bed - sleeping.  Wakes easily, and indicates need to void.  Patient able to sit from sidelying with min cueing with head of bed slightly raised.  Patient indicates very minor headache - 1/10.  Patient transferred to wheelchair with contact guard - stand pivot, and to toilet same level.  Patient had continent void - RN aware.  Patient declined shower today due to time.  Discussed with patient benefit of starting to look at and plan for her therapy schedule in preparation for discharge,  Patient in agreement.  Patient spontaneously attempting to use left hand throughout session without cueing.  Patient left up in wheelchair with breakfast - husband in room.  Call bell and personal items in place.    Therapy Documentation Precautions:  Precautions Precautions: Fall Precaution Comments: L hemi Restrictions Weight Bearing Restrictions: No  Pain: Pain Assessment Pain Scale: 0-10 Pain Score: 1 Headache    Therapy/Group: Individual Therapy  [name] 10/04/2022, 12:12 PM

## 2022-10-04 NOTE — Discharge Instructions (Addendum)
Inpatient Rehab Discharge Instructions  Kelly Stephenson Discharge date and time:  10/13/22  Activities/Precautions/ Functional Status: Activity: no lifting, driving, or strenuous exercise till cleared by MD Diet: cardiac diet Wound Care: none needed   Functional status:  ___ No restrictions     ___ Walk up steps independently _X__ 24/7 supervision/assistance   ___ Walk up steps with assistance ___ Intermittent supervision/assistance  ___ Bathe/dress independently ___ Walk with walker     _X__ Bathe/dress with assistance ___ Walk Independently    ___ Shower independently ___ Walk with assistance    ___ Shower with assistance _X__ No alcohol     ___ Return to work/school ________   Special Instructions:    COMMUNITY REFERRALS UPON DISCHARGE:    Home Health:   PT  OT  SP                  Agency:[facility]    Phone:[phone]   Medical Equipment/Items Ordered: TRANSPORT CHAIR AND TUB BENCH TO GET ROLLING WALKER ON OWN                                                 Agency/Supplier:[facility]  [phone]    STROKE/TIA DISCHARGE INSTRUCTIONS SMOKING Cigarette smoking nearly doubles your risk of having a stroke & is the single most alterable risk factor  If you smoke or have smoked in the last 12 months, you are advised to quit smoking for your health. Most of the excess cardiovascular risk related to smoking disappears within a year of stopping. Ask you doctor about anti-smoking medications [place] Quit Line: 1-800-QUIT NOW Free Smoking Cessation Classes [phone]  CHOLESTEROL Know your levels; limit fat & cholesterol in your diet  Lipid Panel     Component Value Date/Time   CHOL 310 (H) 09/16/2022 1630   TRIG 225 (H) 09/16/2022 1630   HDL 59 09/16/2022 1630   CHOLHDL 5.3 09/16/2022 1630   VLDL 45 (H) 09/16/2022 1630   LDLCALC 206 (H) 09/16/2022 1630     Many patients benefit from treatment even if their cholesterol is at goal. Goal: Total Cholesterol  (CHOL) less than 160 Goal:  Triglycerides (TRIG) less than 150 Goal:  HDL greater than 40 Goal:  LDL (LDLCALC) less than 100   BLOOD PRESSURE American Stroke Association blood pressure target is less that 120/80 mm/Hg  Your discharge blood pressure is:  BP: 136/77 Monitor your blood pressure Limit your salt and alcohol intake Many individuals will require more than one medication for high blood pressure  DIABETES (A1c is a blood sugar average for last 3 months) Goal HGBA1c is under 7% (HBGA1c is blood sugar average for last 3 months)  Diabetes: No known diagnosis of diabetes    Lab Results  Component Value Date   HGBA1C 5.4 09/16/2022    Your HGBA1c can be lowered with medications, healthy diet, and exercise. Check your blood sugar as directed by your physician Call your physician if you experience unexplained or low blood sugars.  PHYSICAL ACTIVITY/REHABILITATION Goal is 30 minutes at least 4 days per week  Activity: No driving, Therapies: see above.  Return to work: N/A Activity decreases your risk of heart attack and stroke and makes your heart stronger.  It helps control your weight and blood pressure; helps you relax and can improve your mood. Participate in a  regular exercise program. Talk with your doctor about the best form of exercise for you (dancing, walking, swimming, cycling).  DIET/WEIGHT Goal is to maintain a healthy weight  Your discharge diet is:  Diet Order             Diet Heart Room service appropriate? Yes with Assist; Fluid consistency: Thin  Diet effective now                   liquids Your height is:  Height: 5\' 4"  (162.6 cm) Your current weight is: Weight: 193 lbs  Your Body Mass Index (BMI) is:  BMI (Calculated): 33.21 Following the type of diet specifically designed for you will help prevent another stroke. Your goal weight range is:  145 lbs Your goal Body Mass Index (BMI) is 19-24. Healthy food habits can help reduce 3 risk factors for stroke:   High cholesterol, hypertension, and excess weight.  RESOURCES Stroke/Support Group:  Call [phone]   STROKE EDUCATION PROVIDED/REVIEWED AND GIVEN TO PATIENT Stroke warning signs and symptoms How to activate emergency medical system (call 911). Medications prescribed at discharge. Need for follow-up after discharge. Personal risk factors for stroke. Pneumonia vaccine given:  Flu vaccine given:  My questions have been answered, the writing is legible, and I understand these instructions.  I will adhere to these goals & educational materials that have been provided to me after my discharge from the hospital.     My questions have been answered and I understand these instructions. I will adhere to these goals and the provided educational materials after my discharge from the hospital.  Patient/Caregiver Signature _______________________________ Date __________  Clinician Signature _______________________________________ Date __________  Please bring this form and your medication list with you to all your follow-up doctor's appointments.   Psychiatry: You have been restarted on sertraline 75 mg, which should help to prevent further episodes of anxiety/panic. The rehab team is referring you to outpatient psychiatry in [place]; if you decide you want talk therapy after that you can ask for a referral from your psychiatrist or go to [email or web address] and sort by your insurance.   The primary team will be sending you home with a few pills of haldol 2 mg/lorazepam 1 mg for acute panic attacks, but if you are having these symptoms more than 2x/week you should schedule an urgent followup with your psychiatrist.   Psychiatric reasons to go to the ED (as discussed) are if you feel unsafe or are having thoughts of harming yourself. If you are having symptoms similar to your initial stroke, you should also probably go to the emergency department.

## 2022-10-04 NOTE — Progress Notes (Signed)
Orthopedic Tech Progress Note Patient Details:  Kelly Stephenson [birth year] [id number]  Called in order to [facility] for an [place]   Patient ID: Kelly Stephenson, female   DOB: 14-Jan-1971, 52 y.o.   MRN: [id number]  [name] 10/04/2022, 5:26 PM

## 2022-10-05 DIAGNOSIS — F411 Generalized anxiety disorder: Secondary | ICD-10-CM

## 2022-10-05 LAB — CULTURE, BLOOD (ROUTINE X 2)
Culture: NO GROWTH
Culture: NO GROWTH
Special Requests: ADEQUATE
Special Requests: ADEQUATE

## 2022-10-05 MED ORDER — MELATONIN 3 MG PO TABS
3.0000 mg | ORAL_TABLET | Freq: Every day | ORAL | Status: DC
  Administered 2022-10-05 – 2022-10-12 (×8): 3 mg via ORAL
  Filled 2022-10-05 (×8): qty 1

## 2022-10-05 NOTE — Progress Notes (Signed)
Occupational Therapy Weekly Progress Note  Patient Details  Name: Kelly Stephenson MRN: [id number] Date of Birth: May 13, 1971  Beginning of progress report period: September 28, 2022 End of progress report period: October 05, 2022  Today's Date: 10/05/2022 OT Individual Time: 5176-1607 OT Individual Time Calculation (min): 48 min    Patient has met 1 of 5 short term goals.  Patient missed a great deal of therapy last week due to multiple medical issues.  These issues seem to be resolving and patient is actively able to participate again in rehab process.    Patient continues to demonstrate the following deficits: abnormal tone, unbalanced muscle activation, and decreased coordination, decreased midline orientation, decreased attention to left, and decreased motor planning, decreased initiation, decreased attention, decreased awareness, decreased problem solving, decreased safety awareness, decreased memory, and delayed processing, and decreased sitting balance, decreased standing balance, decreased postural control, hemiplegia, and decreased balance strategies and therefore will continue to benefit from skilled OT intervention to enhance overall performance with BADL.  Patient progressing toward long term goals..  Continue plan of care.  OT Short Term Goals Week 2:  OT Short Term Goal 1 (Week 2): Patient will don upper body clothing with set up and cueing OT Short Term Goal 1 - Progress (Week 2): Met OT Short Term Goal 2 (Week 2): Patient wilL utilize LUE to aide with hygiene and self feeding with only initial cueing OT Short Term Goal 2 - Progress (Week 2): Progressing toward goal OT Short Term Goal 3 (Week 2): Patient will tie shoes with min assist using bimanual technique OT Short Term Goal 3 - Progress (Week 2): Progressing toward goal OT Short Term Goal 4 (Week 2): Patient will complete toilet/ shower transfer with contact guard assist OT Short Term Goal 4 - Progress (Week 2): Progressing  toward goal OT Short Term Goal 5 (Week 2): Patient will complete toileting with contact guard and cueing OT Short Term Goal 5 - Progress (Week 2): Progressing toward goal Week 3:  OT Short Term Goal 1 (Week 3): Patient wilL utilize LUE to aide with hygiene and self feeding with only initial cueing OT Short Term Goal 2 (Week 3): Patient will tie shoes with min assist using bimanual technique OT Short Term Goal 3 (Week 3): Patient will complete toilet/ shower transfer with contact guard assist OT Short Term Goal 4 (Week 3): Patient will complete toileting with contact guard and cueing OT Short Term Goal 5 (Week 3): Patient will don upper body pull over shirt independently  Skilled Therapeutic Interventions/Progress Updates:    Patient received seated up in wheelchair and awake!  Patient eager for shower.  Transported to bathroom in wheelchair and patient able to shower with min assist while seated.  Discussed tub equipment recommendations with patient.  Completed grooming at sink and asked to remain in her bathrobe. Left in bathrobe due to time restraints.  Patient asking for her chair position to be adjusted - multiple times this session.  Educated patient to maneuver wheelchair to turn, back up etc.  Patient left up in wheelchair with safety belt in place.  And mother at bedside.    Therapy Documentation Precautions:  Precautions Precautions: Fall Precaution Comments: L hemi Restrictions Weight Bearing Restrictions: No   Pain: Pain Assessment Pain Scale: 0-10 Pain Score: 0-No pain    Therapy/Group: Individual Therapy  [name] 10/05/2022, 12:23 PM

## 2022-10-05 NOTE — Patient Care Conference (Signed)
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 Leeroy Cha Social Worker Present: Ovidio Kin, LCSW Nurse Present: Dorien Chihuahua, RN PT Present: Ginnie Smart, PT OT Present: Other (comment) Antony Salmon, OT) SLP Present: Helaine Chess, SLP PPS Coordinator present : Gunnar Fusi, SLP     Current Status/Progress Goal Weekly Team Focus  Bowel/Bladder   Pt is continenet B/B LBM 10/04/22   Will maintain normal B/B pattern   Toilet qshift/prn    Swallow/Nutrition/ Hydration               ADL's   Mod/min assist BADL - Pain, fever, fatigue - improved significantly - increasing intensity of therapy schedule   Supervision/min assist BADL   Attention to left, Left functional use, family education, return to daily routine    Mobility   minA bed mobility, minA squat<>pivot transfers. Cognition appears to be improving and clearing.   CGA overall  Activity tolerance, LLE NMR, gait training, motor planning, AFO consult     Communication                Safety/Cognition/ Behavioral Observations  Moderate cognitive deficits per SLUMS -  Min A   Supervision   complex problem-solving, recall with use of strategies, sustained attentoin, emergent awareness    Pain   Denies pain at this time.   Will be free from pain   Assess for pain qshift/prn    Skin   Skin is intact with Redness to L elbow, L great toe & R ankle - ointment applied per orders   Will maintain skin intergrity with no breakdown  Assess skin for breakdown qshift/prn      Discharge Planning:  Multiple medical issues have limited her participation in therapies-family is here daily and providing support and assist when appropriate   Team Discussion: Patient doing better after episode over the weekend attributed to medications that were discontinued. HA better; meds adjusted per MD. Fatigue improved, and insight improved.  Patient on target to meet rehab goals: yes, currently needs min - mod assist for ADLs and min assist for squat pivots and sit - stand.  Needs mi - mod assist for ambulation wearing an AFO.  Mod assist for cognitive deficits; min assist for basic functional tasks however mod assist for complex tasks. Goals for discharge set for supervision - CGA - min assist overall.  *See Care Plan and progress notes for long and short-term goals.   Revisions to Treatment Plan:  Behavior plan updated   Teaching Needs: Safety, medications, dietary  modification, transfers, toileting, etc.   Current Barriers to Discharge: Decreased caregiver support and Home enviroment access/layout  Possible Resolutions to Barriers: Family education OP follow up services DME: TTB, W/C     Medical Summary Current Status: headache, transaminitis, AKI, HTN, ICH  Barriers to Discharge: Medical stability  Barriers to Discharge Comments: headache, transaminitis, AKI, HTN, ICH Possible Resolutions to [employer] Focus: d/c topamax, d/c  hydralazine and statin, encouraged oral hydration, continue losartan and magnesium, CT obtained and discussed improvement in hemorrhage   Continued Need for Acute Rehabilitation Level of Care: The patient requires daily medical management by a physician with specialized training in physical medicine and rehabilitation for the following reasons: Direction of a multidisciplinary physical rehabilitation program to maximize functional independence : Yes Medical management of patient stability for increased activity during participation in an intensive rehabilitation regime.: Yes Analysis of laboratory values and/or radiology reports with any subsequent need for medication adjustment and/or medical intervention. : Yes   I attest that I was present, lead the team conference, and concur with the assessment and plan of the team.   Dorien Chihuahua B 10/05/2022, 3:12 PM

## 2022-10-05 NOTE — Progress Notes (Signed)
Physical Therapy Weekly Progress Note  Patient Details  Name: Kelly Stephenson MRN: [id number] Date of Birth: 18-Oct-1970  Beginning of progress report period: September 28, 2022 End of progress report period: October 05, 2022  Today's Date: 10/05/2022 PT Individual Time: 1330-1415 PT Individual Time Calculation (min): 45 min   Patient has met 0 of 4 short term goals.  Pt with limited progress this reporting period 2/2 medical complications. Patient needed her therapy frequency tapered, eventually needing QD as she was unable to tolerate daily therapies. Fortunately, she has shown improved clarity/cognition and activity tolerance starting this week and her therapy has been bumped back to 15/7. She continues to be primarily limited by cognitive deficits, L sided weakness, motor apraxia, and L inattention. She's beginning to develop tone in her L side. Adjustable night splint ordered for LLE.   Patient continues to demonstrate the following deficits muscle weakness and muscle joint tightness, decreased cardiorespiratoy endurance, abnormal tone, unbalanced muscle activation, motor apraxia, and decreased coordination, decreased attention to left and decreased motor planning, decreased initiation, decreased attention, decreased awareness, decreased problem solving, decreased safety awareness, and decreased memory, and decreased standing balance, decreased postural control, hemiplegia, and decreased balance strategies and therefore will continue to benefit from skilled PT intervention to increase functional independence with mobility.  Patient progressing toward long term goals..  Continue plan of care.  PT Short Term Goals Week 2:  PT Short Term Goal 1 (Week 2): Pt will complete bed mobility with CGA PT Short Term Goal 1 - Progress (Week 2): Progressing toward goal PT Short Term Goal 2 (Week 2): Pt will complete bed<>chair transfers with CGA and LRAD PT Short Term Goal 2 - Progress (Week 2): Progressing  toward goal PT Short Term Goal 3 (Week 2): Pt will ambulate 18ft with minA and LRAD PT Short Term Goal 3 - Progress (Week 2): Not met PT Short Term Goal 4 (Week 2): Pt will navigate up/down x4 steps with minA and LRAD PT Short Term Goal 4 - Progress (Week 2): Not met Week 3:  PT Short Term Goal 1 (Week 3): STG = LTG due to ELOS  Skilled Therapeutic Interventions/Progress Updates:      Pt sitting in w/c and ready for therapy. No reports of pain. Donned tennis shoes with totalA for time.   Transported in w/c to main rehab gym. Focused session on gait training and stair training.  Ace wrapped L foot for DF assist. Anticipate need for AFO - order in.   Gait training ~15ft + ~52ft with minA and RW - Cues for progressing from step-to pattern to reciprocal stepping. TC for L quad control in stance to promote extension as she tends to keep L knee flexed in stance. Pt reports impaired joint proprioception - unaware of if L knee is flexed or extended.   Stair training with 6inch steps and 2 hand rails. minA overall for navigating 4 steps while forward facing and step-to pattern with R foot leading ascent and L leading descent. Cues for approach, sequencing, and L stance control.   Returned to her room and concluded session in w/c. All needs met with her family present.    Therapy Documentation Precautions:  Precautions Precautions: Fall Precaution Comments: L hemi Restrictions Weight Bearing Restrictions: No General:     Therapy/Group: Individual Therapy  Alger Simons 10/05/2022, 7:41 AM

## 2022-10-05 NOTE — Progress Notes (Addendum)
Speech Language Pathology Weekly Progress and Session Note  Patient Details  Name: Kelly Stephenson MRN: [id number] Date of Birth: 1971-01-31  Beginning of progress report period: September 28, 2022 End of progress report period: October 05, 2022  Today's Date: 10/05/2022 SLP Individual Time: 0725-0825 SLP Individual Time Calculation (min): 60 min  Short Term Goals: Week 2: SLP Short Term Goal 1 (Week 2): Patient will orient x4 with sup A for use of external aids. SLP Short Term Goal 1 - Progress (Week 2): Met SLP Short Term Goal 2 (Week 2): Patient will utilize memory compensations to recall biographical and/or novel information with min-mod A verbal/visual cues SLP Short Term Goal 2 - Progress (Week 2): Met SLP Short Term Goal 3 (Week 2): Patient will sustain attention to functional tasks for 7 minute intervals with min A verbal redirection cues SLP Short Term Goal 3 - Progress (Week 2): Met SLP Short Term Goal 4 (Week 2): Patient will complete basic complex problem solving with min-mod A verbal/visual cues to achieve 80% accuracy SLP Short Term Goal 4 - Progress (Week 2): Met SLP Short Term Goal 5 (Week 2): Patient will participate in further cognitive-linguistic evaluation to full completion for ongoing goal development SLP Short Term Goal 5 - Progress (Week 2): Met    New Short Term Goals: Week 3: SLP Short Term Goal 1 (Week 3): STG's = LTG's due to ELOS  Weekly Progress Updates: Recently, pt's medical status has improved and she has been able to meet 5 out of 5 short-term goals outlined above. Cognitive assessment completed (SLUMS), which revealed at least moderate cognitive impairment as evident by achieving a score of 17/30 (pt with high PLOF). Pt and family education ongoing. Currently, pt benefits from Sup to [place] for completion of cognitive tasks; goals are set for Sup A - hopeful that pt will be able to achieve long-term goals with improvement in medical status. Continue to  recommend ST intervention during this admission. Anticipate need for 24/7 supervision + assistance, as well as f/u ST intervention at next venue of care.   Intensity: Minumum of 1-2 x/day, 30 to 90 minutes Frequency: 3 to 5 out of 7 days Duration/Length of Stay: ELOS 10/13/2022 Treatment/Interventions: Cognitive remediation/compensation;Internal/external aids;Functional tasks;Patient/family education;Therapeutic Activities   Daily Session  Skilled Therapeutic Interventions:     Pt seen this date for skilled ST intervention targeting cognitive goals outlined above. Pt received lying supine in bed, asleep; aroused easily to name. Reports poor sleep, though endorses this at baseline. Husband present, though sleeping in recliner chair. Agreeable to intervention with encouragement. Pleasant and participatory throughout. Upon inquiry, verbalizes that she is unaware of her medical status from the past week. Delayed processing noted. Discourse intermittently off-topic and perseverative in nature with decreased eye contact.   Today's session with emphasis on cognitive skill training within the context of functional tasks. Pt requesting to complete toileting at onset of today's session. With verbal education and encouragement, pt directed her care by providing verbal sequence to assist in safe bed to w/c and w/c to toilet transfer with Sup-Min A verbal cues. Verbalized safety precautions such as making sure grip socks and gait belt were donned at the Mod I level, though benefited from Min A to recall squat vs stand pivot recommendation, outlined in safety plan, and to ensure w/c brakes are locked prior to transferring; she verbalized understanding via teach back. Pt continent of bladder. Completed hand hygiene following toileting with Sup A. Pt then set-up for  breakfast given pt hadn't yet eaten. Unable to demonstrate multitasking with meal and talking with therapist, and benefited from doing one task at a time.  Nevertheless, sustained attention does appear improved, for up to 10 minute intervals, with Sup A. Oriented to self, place, and situation at the Mod I level; date with set-up A for use of calendar (on probe, unable to verbalize date). Recalled biographical information at the Mod I level and Sup to Min A for novel information presented during today's session (delayed recall with distractions). Additionally, SLP provided skilled education re: ST POC, current medications, and importance of directing her care/self-advocacy and agency to assist staff in assisting her with her needs; she verbalized and demonstrated understanding given extended processing time.   Pt left in room and OOB in w/c with all safety measures activated and call bell within reach. Husband remained at bedside. Continue per current ST POC.  Pain No pain reported; NAD  Therapy/Group: Individual Therapy  Dagoberto Nealy A Shabree Tebbetts 10/05/2022, 11:42 AM

## 2022-10-05 NOTE — Progress Notes (Signed)
Patient ID: Kelly Stephenson, female   DOB: 12/12/1970, 52 y.o.   MRN: [id number]  Met with pt who was sleepy and mom who is present in her room to update regarding team conference progress toward her goals of supervision to min and discharge still 2/1. Discussed briefly OP therapies and Mom will discuss with pt's husband when here regarding transportation. Hopefully pt has turned the corner and will be more consistently able to participate and work toward her goals here. Sleeping better pain and fatigue better. Work toward discharge and will have family start participate in therapies with her in preparation for discharge.

## 2022-10-05 NOTE — Progress Notes (Signed)
PROGRESS NOTE   Subjective/Complaints: No new complaints this morning Still sleeping poorly Headaches much improved- discussed d/cing topamax HS   ROS: Nausea improved, headaches improved Objective:   No results found. No results for input(s): "WBC", "HGB", "HCT", "PLT" in the last 72 hours.   Recent Labs    10/03/22 0629  NA 133*  K 3.9  CL 103  CO2 21*  GLUCOSE 91  BUN 12  CREATININE 0.82  CALCIUM 8.8*     Intake/Output Summary (Last 24 hours) at 10/05/2022 1020 Last data filed at 10/04/2022 2024 Gross per 24 hour  Intake 480 ml  Output 0 ml  Net 480 ml         Physical Exam: Vital Signs Blood pressure 136/77, pulse 77, temperature 98.4 F (36.9 C), resp. rate 19, height 5\' 4"  (1.626 m), weight 87.8 kg, SpO2 98 %.   General: awake, alert, appropriate,  HENT: conjugate gaze; oropharynx moist CV: regular rate; no JVD Pulmonary: CTA B/L; no W/R/R- good air movement GI: soft, less TTP- ND; hypoactive BS, loose stools have resolved Psychiatric: appropriate- sleepy Neurological: Ox2  Ext: no clubbing, cyanosis, or edema, tight left heel cord Psych: flat, does awaken briefly and is cooperative. Skin: No evidence of breakdown, no evidence of rash Neurologic: slow to arouse. Follows commands.  Right sided motor strength is grossly 5/5,    LUE 3- at deltoid , bi, tri, grip, 3- Left HF, KE, 2- ADF  Sensory exam - pt does not attend consistently to participate Left lower back TTP     Assessment/Plan: 1. Functional deficits which require 3+ hours per day of interdisciplinary therapy in a comprehensive inpatient rehab setting. Physiatrist is providing close team supervision and 24 hour management of active medical problems listed below. Physiatrist and rehab team continue to assess barriers to discharge/monitor patient progress toward functional and medical goals  Care Tool:  Bathing    Body parts  bathed by patient: Left arm, Chest, Abdomen, Front perineal area, Buttocks, Right upper leg, Left upper leg, Right lower leg, Face   Body parts bathed by helper: Left lower leg, Right arm     Bathing assist Assist Level: Minimal Assistance - Patient > 75%     Upper Body Dressing/Undressing Upper body dressing   What is the patient wearing?: Pull over shirt    Upper body assist Assist Level: Set up assist    Lower Body Dressing/Undressing Lower body dressing      What is the patient wearing?: Pants, Incontinence brief     Lower body assist Assist for lower body dressing: Moderate Assistance - Patient 50 - 74%     Toileting Toileting    Toileting assist Assist for toileting: Minimal Assistance - Patient > 75%     Transfers Chair/bed transfer  Transfers assist     Chair/bed transfer assist level: Minimal Assistance - Patient > 75%     Locomotion Ambulation   Ambulation assist      Assist level: Moderate Assistance - Patient 50 - 74% Assistive device: Parallel bars Max distance: 8 ft   Walk 10 feet activity   Assist  Walk 10 feet activity did not occur: Safety/medical concerns  Walk 50 feet activity   Assist Walk 50 feet with 2 turns activity did not occur: Safety/medical concerns         Walk 150 feet activity   Assist Walk 150 feet activity did not occur: Safety/medical concerns         Walk 10 feet on uneven surface  activity   Assist Walk 10 feet on uneven surfaces activity did not occur: Safety/medical concerns         Wheelchair     Assist Is the patient using a wheelchair?: Yes Type of Wheelchair: Manual    Wheelchair assist level: Dependent - Patient 0%      Wheelchair 50 feet with 2 turns activity    Assist        Assist Level: Dependent - Patient 0%   Wheelchair 150 feet activity     Assist      Assist Level: Dependent - Patient 0%   Blood pressure 136/77, pulse 77, temperature 98.4  F (36.9 C), resp. rate 19, height 5\' 4"  (1.626 m), weight 87.8 kg, SpO2 98 %.    Medical Problem List and Plan: 1. 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  Impaired mobility: continue Lovenox for DVT ppx. Add SCDs 3. Headache: d/c topamax. On oxycodone and Fioricet prn.  4. Anxiety: change Zoloft to HS 5. Neuropsych/cognition: This patient is capable of making decisions on her own behalf. 6. Skin/Wound Care: Routine pressure relief measures.  7. Fluids/Electrolytes/Nutrition: encourage PO -BUN still elevated 26 but trending down from 1/12 lab (30). 8. HTN: Monitor BP TID. Was not compliant with medications--does not want meds/doesn't like meds, so was noncompliant at home with meds prescribed.  -d/c amlodipine, increase magnesium to 500mg  HS --On Norvasc, Cozaar, Hydralazine and Lopressor.             --SBP goal 130-150 range.   -ordered IV hydralazine 10mg  for SBP >160 9. Hyperlipidemia: LDL 206- on Crestor. 10. Hyponatremia: Question SIADH. Recheck sodium today.  11. Leucocytosis: Likely reactive--still sl elevated  -afebrile --No signs of infection.  12. Acute on chronic Headaches w/ nausea: Took 4 aleve daily.  --Will add Claritin and nose spray to help with congestion as could be exacerbating HA --Improved, d/c topamax. Added magnesium gluconate 250mg  HS 13. B12 deficiency: given IM B12 given 1/11, discussed that she may benefit from these monthly, or increasing consumption of meat.  14. Left wrist pain: discussed that XR results are negative 15. AKI: Cr elevated, IVF started.  16. Pseudobulbar affect: Nudexta discontinued since patient started feeling nauseous after it was given 17. Insomnia: d/c trazodone, d/c topamax, add melatonin 3mg   HS, grounds pass ordered. 18. Hypokalemia: improved, monitor daily and supplement to goal of 4 19. Daytime somnolence: d/c klonopin and daytime topamax 20. Hyponatremia: improving, monitor daily.  21. Nausea: IV zofran ordered prn, improved 22. Transaminitis: likely 2/2 hydralazine and statin: d/ced.  23. Drug induced rash: Antibiotics d/ced.     LOS: 15 days A FACE TO FACE EVALUATION WAS PERFORMED  Shaughnessy Gethers P Derek Huneycutt 10/05/2022, 10:20 AM

## 2022-10-06 LAB — COMPREHENSIVE METABOLIC PANEL
ALT: 137 U/L — ABNORMAL HIGH (ref 0–44)
AST: 37 U/L (ref 15–41)
Albumin: 3.2 g/dL — ABNORMAL LOW (ref 3.5–5.0)
Alkaline Phosphatase: 240 U/L — ABNORMAL HIGH (ref 38–126)
Anion gap: 7 (ref 5–15)
BUN: 14 mg/dL (ref 6–20)
CO2: 25 mmol/L (ref 22–32)
Calcium: 9.3 mg/dL (ref 8.9–10.3)
Chloride: 104 mmol/L (ref 98–111)
Creatinine, Ser: 0.85 mg/dL (ref 0.44–1.00)
GFR, Estimated: >60 mL/min (ref >60)
Glucose, Bld: 135 mg/dL — ABNORMAL HIGH (ref 70–99)
Potassium: 3.3 mmol/L — ABNORMAL LOW (ref 3.5–5.1)
Sodium: 136 mmol/L (ref 135–145)
Total Bilirubin: 0.3 mg/dL (ref 0.3–1.2)
Total Protein: 6.7 g/dL (ref 6.5–8.1)

## 2022-10-06 LAB — MAGNESIUM: Magnesium: 2 mg/dL (ref 1.7–2.4)

## 2022-10-06 MED ORDER — POTASSIUM CHLORIDE CRYS ER 20 MEQ PO TBCR: 20.0000 meq | EXTENDED_RELEASE_TABLET | ORAL | Status: DC

## 2022-10-06 MED ORDER — SERTRALINE HCL 50 MG PO TABS
75.0000 mg | ORAL_TABLET | Freq: Every day | ORAL | Status: DC
  Administered 2022-10-06 – 2022-10-12 (×7): 75 mg via ORAL
  Filled 2022-10-06 (×7): qty 2

## 2022-10-06 MED ORDER — POTASSIUM CHLORIDE CRYS ER 20 MEQ PO TBCR
20.0000 meq | EXTENDED_RELEASE_TABLET | Freq: Two times a day (BID) | ORAL | Status: DC
  Administered 2022-10-06 – 2022-10-11 (×10): 20 meq via ORAL
  Filled 2022-10-06 (×9): qty 1

## 2022-10-06 MED ORDER — POTASSIUM CHLORIDE CRYS ER 20 MEQ PO TBCR: 20.0000 meq | EXTENDED_RELEASE_TABLET | Freq: Two times a day (BID) | ORAL | Status: DC

## 2022-10-06 NOTE — Progress Notes (Signed)
Speech Language Pathology Daily Session Note  Patient Details  Name: AZULA ZAPPIA MRN: [id number] Date of Birth: May 23, 1971  Today's Date: 10/06/2022 SLP Individual Time: 5643-3295 SLP Individual Time Calculation (min): 60 min  Short Term Goals: Week 3: SLP Short Term Goal 1 (Week 3): STG's = LTG's due to ELOS  Skilled Therapeutic Interventions: Pt seen for skilled ST with focus on cognitive communication goal, upright in wheelchair and agreeable to therapeutic tasks. Pt continues to endorse slow processing speed and difficulty with higher level cognitive tasks. Pt also states she wishes her brain could "slow down", reports getting good sleep the last few days has helped. SLP facilitating simple money management task by providing mod-max A cues to increase accuracy. 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 Pt attempting pen and paper to increase mathematical accuracy but it seemed to increase difficulty. On the "Solving Daily Math Problems" portion of the ALFA, pt with 70% accuracy independent, min verbal cues increased to 100%. Pt demonstrating sustained attention during structured and unstructured tasks for 10-15 minutes before fatiguing and requiring rest breaks. Pt Mod I for recall of family details and other biographical information which she reports is an improvement. Discussed simple cognitively stimulating exercises to complete independently throughout the day, pt verbalized understanding. Pt left in wheelchair with all needs met, cont ST POC.   Pain Pain Assessment Pain Scale: 0-10 Pain Score: 0-No pain  Therapy/Group: Individual Therapy  Dewaine Conger 10/06/2022, 11:31 AM

## 2022-10-06 NOTE — Progress Notes (Signed)
PROGRESS NOTE   Subjective/Complaints: She is feeling much better Denies headache Had visit yesterday and it was a little overwhelming   ROS: Nausea improved, headaches improved, insomnia improved Objective:   No results found. No results for input(s): "WBC", "HGB", "HCT", "PLT" in the last 72 hours.   No results for input(s): "NA", "K", "CL", "CO2", "GLUCOSE", "BUN", "CREATININE", "CALCIUM" in the last 72 hours.    Intake/Output Summary (Last 24 hours) at 10/06/2022 1104 Last data filed at 10/06/2022 9485 Gross per 24 hour  Intake 480 ml  Output 0 ml  Net 480 ml         Physical Exam: Vital Signs Blood pressure 129/74, pulse 73, temperature 97.7 F (36.5 C), resp. rate 17, height 5\' 4"  (1.626 m), weight 87.8 kg, SpO2 97 %.   General: awake, alert, appropriate,  HENT: conjugate gaze; oropharynx moist CV: regular rate; no JVD Pulmonary: CTA B/L; no W/R/R- good air movement GI: soft, less TTP- ND; hypoactive BS, loose stools have resolved Psychiatric: appropriate- sleepy Neurological: Ox2  Ext: no clubbing, cyanosis, or edema, tight left heel cord Psych: positive spirits Skin: No evidence of breakdown, no evidence of rash Neurologic: slow to arouse. Follows commands.  Right sided motor strength is grossly 5/5,    LUE 3- at deltoid , bi, tri, grip, 3- Left HF, KE, 2- ADF  Sensory exam - pt does not attend consistently to participate      Assessment/Plan: 1. Functional deficits which require 3+ hours per day of interdisciplinary therapy in a comprehensive inpatient rehab setting. Physiatrist is providing close team supervision and 24 hour management of active medical problems listed below. Physiatrist and rehab team continue to assess barriers to discharge/monitor patient progress toward functional and medical goals  Care Tool:  Bathing    Body parts bathed by patient: Left arm, Chest, Abdomen, Front  perineal area, Buttocks, Right upper leg, Left upper leg, Right lower leg, Face   Body parts bathed by helper: Left lower leg, Right arm     Bathing assist Assist Level: Minimal Assistance - Patient > 75%     Upper Body Dressing/Undressing Upper body dressing   What is the patient wearing?: Pull over shirt    Upper body assist Assist Level: Set up assist    Lower Body Dressing/Undressing Lower body dressing      What is the patient wearing?: Pants, Incontinence brief     Lower body assist Assist for lower body dressing: Moderate Assistance - Patient 50 - 74%     Toileting Toileting    Toileting assist Assist for toileting: Minimal Assistance - Patient > 75%     Transfers Chair/bed transfer  Transfers assist     Chair/bed transfer assist level: Minimal Assistance - Patient > 75%     Locomotion Ambulation   Ambulation assist      Assist level: Moderate Assistance - Patient 50 - 74% Assistive device: Parallel bars Max distance: 8 ft   Walk 10 feet activity   Assist  Walk 10 feet activity did not occur: Safety/medical concerns        Walk 50 feet activity   Assist Walk 50 feet with 2 turns  activity did not occur: Safety/medical concerns         Walk 150 feet activity   Assist Walk 150 feet activity did not occur: Safety/medical concerns         Walk 10 feet on uneven surface  activity   Assist Walk 10 feet on uneven surfaces activity did not occur: Safety/medical concerns         Wheelchair     Assist Is the patient using a wheelchair?: Yes Type of Wheelchair: Manual    Wheelchair assist level: Dependent - Patient 0%      Wheelchair 50 feet with 2 turns activity    Assist        Assist Level: Dependent - Patient 0%   Wheelchair 150 feet activity     Assist      Assist Level: Dependent - Patient 0%   Blood pressure 129/74, pulse 73, temperature 97.7 F (36.5 C), resp. rate 17, height 5\' 4"  (1.626  m), weight 87.8 kg, SpO2 97 %.    Medical Problem List and Plan: 1. 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  Impaired mobility: continue Lovenox for DVT ppx. Add SCDs 3. Headache: d/c topamax. On oxycodone and Fioricet prn.  4. Anxiety: change Zoloft to HS 5. Neuropsych/cognition: This patient is capable of making decisions on her own behalf. 6. Skin/Wound Care: Routine pressure relief measures.  7. Fluids/Electrolytes/Nutrition: encourage PO -BUN still elevated 26 but trending down from 1/12 lab (30). 8. HTN: Monitor BP TID. Was not compliant with medications--does not want meds/doesn't like meds, so was noncompliant at home with meds prescribed.  -d/c amlodipine, increase magnesium to 500mg  HS --On Norvasc, Cozaar, Hydralazine and Lopressor.             --SBP goal 130-150 range.   -ordered IV hydralazine 10mg  for SBP >160 9. Hyperlipidemia: LDL 206- on Crestor. 10. Hyponatremia: Question SIADH. Recheck sodium today.  11. Leucocytosis: Likely reactive--still sl elevated  -afebrile --No signs of infection.  12. Acute on chronic Headaches w/ nausea: Took 4 aleve daily.  --Will add Claritin and nose spray to help with congestion as could be exacerbating HA --Improved, d/c topamax. Added magnesium gluconate 250mg  HS 13. B12 deficiency: given IM B12 given 1/11, discussed that she may benefit from these monthly, or increasing consumption of meat.  14. Left wrist pain: discussed that XR results are negative 15. AKI: Cr elevated, IVF started.  16. Pseudobulbar affect: Nudexta discontinued since patient started feeling nauseous after it was given 17. Insomnia: d/c trazodone, d/c topamax, add melatonin 3mg  HS, grounds pass ordered. 18. Hypokalemia:  improved, monitor daily and supplement to goal of 4 19. Daytime somnolence: d/c klonopin and daytime topamax 20. Hyponatremia: improving, monitor daily.  21. Nausea: resolved, d/c zofran. 22. Transaminitis: likely 2/2 hydralazine and statin: d/ced. Check LFTs today.  23. Drug induced rash: Antibiotics d/ced. Resolved.  24. Suboptimal magnesium: check magnesium level today    LOS: 16 days A FACE TO FACE EVALUATION WAS PERFORMED  Jannis Atkins P Shantika Bermea 10/06/2022, 11:04 AM

## 2022-10-06 NOTE — Progress Notes (Signed)
Occupational Therapy Session Note  Patient Details  Name: Kelly Stephenson MRN: [id number] Date of Birth: 11-27-1970  Today's Date: 10/06/2022 OT Individual Time: 4098-1191 OT Individual Time Calculation (min): 30 min    Short Term Goals: Week 1:  OT Short Term Goal 1 (Week 1): Patient will bathe upper body with min assist OT Short Term Goal 1 - Progress (Week 1): Met OT Short Term Goal 2 (Week 1): Patient will bathe lower body with mod assist OT Short Term Goal 2 - Progress (Week 1): Met OT Short Term Goal 3 (Week 1): Patient will complete toilet transfer with min assist OT Short Term Goal 3 - Progress (Week 1): Met OT Short Term Goal 4 (Week 1): Patient will don/doff pull over shirt with min assist OT Short Term Goal 4 - Progress (Week 1): Met OT Short Term Goal 5 (Week 1): Patient will don/doff elastic waist pants with mod assist OT Short Term Goal 5 - Progress (Week 1): Met Week 2:  OT Short Term Goal 1 (Week 2): Patient will don upper body clothing with set up and cueing OT Short Term Goal 1 - Progress (Week 2): Met OT Short Term Goal 2 (Week 2): Patient wilL utilize LUE to aide with hygiene and self feeding with only initial cueing OT Short Term Goal 2 - Progress (Week 2): Progressing toward goal OT Short Term Goal 3 (Week 2): Patient will tie shoes with min assist using bimanual technique OT Short Term Goal 3 - Progress (Week 2): Progressing toward goal OT Short Term Goal 4 (Week 2): Patient will complete toilet/ shower transfer with contact guard assist OT Short Term Goal 4 - Progress (Week 2): Progressing toward goal OT Short Term Goal 5 (Week 2): Patient will complete toileting with contact guard and cueing OT Short Term Goal 5 - Progress (Week 2): Progressing toward goal Week 3:  OT Short Term Goal 1 (Week 3): Patient wilL utilize LUE to aide with hygiene and self feeding with only initial cueing OT Short Term Goal 2 (Week 3): Patient will tie shoes with min assist using  bimanual technique OT Short Term Goal 3 (Week 3): Patient will complete toilet/ shower transfer with contact guard assist OT Short Term Goal 4 (Week 3): Patient will complete toileting with contact guard and cueing OT Short Term Goal 5 (Week 3): Patient will don upper body pull over shirt independently  Skilled Therapeutic Interventions/Progress Updates:    1:1 Pt in good spirits today and requested to take a shower today. She was already up and in the w/c when arrived. W/c transported into bathroom and pt able to doff clothing with min A for standing and encouragement. Pt able to engaged both hands in all tasks with environment and verbal min cues. Pt able to perform stand step transfers with min A with grab bar with cues for positioning and sustain mm activity. Pt able to stand to bathe peri area with steadying A - pt initiated  washing with both hands (alternating). Pt bathe the rest of UB and declined LB in sitting position. Pt performed transfer out of shower with min A. Donned shirt with setup. Pt required cues for dressing left side and cues for functional problem solving of how to thread left LE successful- was able to perform task successfully for underwear and pants. Sit to stands with cues for feet placement and sustained activity for terminal knee and hip extension on the left. Left sitting up in the chair with mom present.  Therapy Documentation Precautions:  Precautions Precautions: Fall Precaution Comments: L hemi Restrictions Weight Bearing Restrictions: No  Pain: Pain Assessment Pain Scale: 0-10 Pain Score: 0-No pain   Therapy/Group: Individual Therapy  Willeen Cass [facility] 10/06/2022, 1:15 PM

## 2022-10-06 NOTE — Progress Notes (Signed)
Physical Therapy Session Note  Patient Details  Name: Kelly Stephenson MRN: [id number] Date of Birth: [birth year]  Today's Date: 10/06/2022 PT Individual Time: 1330-1430 PT Individual Time Calculation (min): 60 min   Short Term Goals: Week 3:  PT Short Term Goal 1 (Week 3): STG = LTG due to ELOS  Skilled Therapeutic Interventions/Progress Updates:  Patient greeted sitting upright in wheelchair in room with mother present and agreeable to PT treatment session. Patient wheeled to rehab gym for time management and energy conservation.   Patient performed various sit/stand with RW and CGA- VC for proper hand placement and tucking B feet underneath her prior to standing with good effort throughout.   Patient gait trained x25' with RW and CGA with L AFO donned. Unable to ambulate further secondary to patient reporting increased pain from tightness in toe box and potentially too long of toenails (patient reports someone is coming to give her a mani/pedi tomorrow). Patient educated on potential need to purchase a shoe that is half a size bigger to account for the AFO.   Patient gait trained x25' with RW and CGA for safety- Therapist ACE wrapped L LE to improve DF for improved foot clearance during swing phase. Patient required VC for improved quad activation during stance phase with good improvements and stability noted. Patient unable to ambulate further than 25' at this time secondary to severe anxiety that was unable to be controlled with deep breathing exercises leading to patient becoming very emotional and requiring a seated rest break. Patient reporting feeling overwhelmed and scared because she can't feel when she is supposed to "tighten" her knee during gait- Therapist spent time providing education and actively listening.   Patient stood with RW and performed x30+ R foot taps to 4" block with therapist providing facilitation for improved L knee stability during stance phase.   Patient returned  to her room and transferred to bed from wheelchair with RW and CGA. Patient left sitting EOB with NT present and all needs met.    Therapy Documentation Precautions:  Precautions Precautions: Fall Precaution Comments: L hemi Restrictions Weight Bearing Restrictions: No  Therapy/Group: Individual Therapy  Khrystyna Schwalm 10/06/2022, 7:57 AM

## 2022-10-06 NOTE — Progress Notes (Addendum)
Followed up on patient who was reported to have a panic attack when working with PT. She was trying to walk, could not feel her left leg, was afraid that she was going to fall with PT pushing her to walk. She got hot, panicky and could not get a breath. We talked about deep breathing, pacing and advocating for herself so she could work thorough it. Discussed that sensory deficits will take time and will increase Zoloft to 75 mg to help with coping/anxiety-->she was agreeable to this as she does not want another pill. I also discussed importance of medication compliance after discharge.    Labs today show LFTs improving but downward trend in K+ as off supplement. Will continue to supplement BID for a few days and recheck in am and Monday.

## 2022-10-07 LAB — BASIC METABOLIC PANEL
Anion gap: 9 (ref 5–15)
BUN: 16 mg/dL (ref 6–20)
CO2: 26 mmol/L (ref 22–32)
Calcium: 9.2 mg/dL (ref 8.9–10.3)
Chloride: 103 mmol/L (ref 98–111)
Creatinine, Ser: 0.84 mg/dL (ref 0.44–1.00)
GFR, Estimated: >60 mL/min (ref >60)
Glucose, Bld: 121 mg/dL — ABNORMAL HIGH (ref 70–99)
Potassium: 3.7 mmol/L (ref 3.5–5.1)
Sodium: 138 mmol/L (ref 135–145)

## 2022-10-07 MED ORDER — LOSARTAN POTASSIUM 50 MG PO TABS: 50.0000 mg | ORAL_TABLET | Freq: Every day | ORAL | Status: DC

## 2022-10-07 MED ORDER — AMLODIPINE BESYLATE 10 MG PO TABS
10.0000 mg | ORAL_TABLET | Freq: Every day | ORAL | Status: DC
  Administered 2022-10-07 – 2022-10-09 (×3): 10 mg via ORAL
  Filled 2022-10-07 (×3): qty 1

## 2022-10-07 MED ORDER — METOPROLOL TARTRATE 50 MG PO TABS
75.0000 mg | ORAL_TABLET | Freq: Two times a day (BID) | ORAL | Status: DC
  Administered 2022-10-07 – 2022-10-13 (×11): 75 mg via ORAL
  Filled 2022-10-07 (×12): qty 1

## 2022-10-07 MED ORDER — LOSARTAN POTASSIUM 50 MG PO TABS
50.0000 mg | ORAL_TABLET | Freq: Every day | ORAL | Status: DC
  Administered 2022-10-08 – 2022-10-12 (×5): 50 mg via ORAL
  Filled 2022-10-07 (×5): qty 1

## 2022-10-07 MED ORDER — METOPROLOL TARTRATE 50 MG PO TABS: 50.0000 mg | ORAL_TABLET | Freq: Two times a day (BID) | ORAL | Status: DC

## 2022-10-07 MED ORDER — OXYCODONE HCL 5 MG PO TABS: 5.0000 mg | ORAL_TABLET | Freq: Four times a day (QID) | ORAL | Status: DC | PRN

## 2022-10-07 MED ORDER — AMLODIPINE BESYLATE 5 MG PO TABS: 5.0000 mg | ORAL_TABLET | Freq: Every day | ORAL | Status: DC

## 2022-10-07 NOTE — Progress Notes (Signed)
Physical Therapy Session Note  Patient Details  Name: Kelly Stephenson MRN: [id number] Date of Birth: 1971-02-16  Today's Date: 10/07/2022 PT Individual Time: 1115-1200 PT Individual Time Calculation (min): 45 min   Short Term Goals: Week 3:  PT Short Term Goal 1 (Week 3): STG = LTG due to ELOS  Skilled Therapeutic Interventions/Progress Updates:  Patient greeted sitting upright in wheelchair in room with mother present and agreeable to PT treatment session. Therapist discussed with patient and mother regarding signing family off to perform transfers with patient in order to improve overall independence and decrease anxiety when having to wait on nursing staff to use the restroom, however patient was not agreeable to this option. Therapist donned socks and tennis shoes for time management. Patient wheeled to the rehab gym for time management and energy conservation.   Patient performed various sit/stands throughout treatment session with RW and CGA- VC for improved L TKE during stance phase with good effort noted.   Patient gait trained x35' and x40' with RW and CGA- Therapist bringing wheelchair behind patient for safety. Minor VC throughout for improved L TKE during stance phase for improved stability.   Patient performed 4 transfers to/from wheelchair and mat table with RW and CGA/close SBA for safety- VC throughout for improved L TKE during stance phase and taking large steps instead of shuffling her feet toward each surface.   Patient returned to her room sitting upright in wheelchair with tray table in front, call bell within reach, mother present and all needs met.    Therapy Documentation Precautions:  Precautions Precautions: Fall Precaution Comments: L hemi Restrictions Weight Bearing Restrictions: No  Therapy/Group: Individual Therapy  Aras Albarran 10/07/2022, 7:56 AM

## 2022-10-07 NOTE — Progress Notes (Signed)
Occupational Therapy Session Note  Patient Details  Name: Kelly Stephenson MRN: [id number] Date of Birth: 02-25-1971  Today's Date: 10/07/2022 OT Individual Time: 3335-4562 OT Individual Time Calculation (min): 42 min    Short Term Goals: Week 3:  OT Short Term Goal 1 (Week 3): Patient wilL utilize LUE to aide with hygiene and self feeding with only initial cueing OT Short Term Goal 2 (Week 3): Patient will tie shoes with min assist using bimanual technique OT Short Term Goal 3 (Week 3): Patient will complete toilet/ shower transfer with contact guard assist OT Short Term Goal 4 (Week 3): Patient will complete toileting with contact guard and cueing OT Short Term Goal 5 (Week 3): Patient will don upper body pull over shirt independently  Skilled Therapeutic Interventions/Progress Updates:    Patient received awake in bed - had called for assistance to use bathroom.  Patient able to transition sidelying to sit without physical assistance with increased time.  Transferred bed to wheelchair - squat pivot with supervision/ set up.  Patient declined option to walk to bathroom due to urgency.  Transported in wheelchair for continent void - RN aware.  Patient able to complete all aspects of toileting with contact guard.  Declined shower - opted to complete hygiene and dressing at sink.  Min assist to pull pants over left hip.  Transported patient to ADL apartment to address tub shower transfer with bench, hand held shower, and suction grab bar.  Patient did not practice - but observed demonstration.  Transported back to room and patient set up for breakfast.  Call bell in reach, husband in room, and breakfast tray in front.  Safety belt in place and engaged.    Therapy Documentation Precautions:  Precautions Precautions: Fall Precaution Comments: L hemi Restrictions Weight Bearing Restrictions: No  Pain: Pain Assessment Pain Scale: 0-10 Pain Score: 0-No pain    Therapy/Group: Individual  Therapy  [name] 10/07/2022, 11:27 AM

## 2022-10-07 NOTE — Progress Notes (Signed)
PROGRESS NOTE   Subjective/Complaints: Feeling well this morning Sleeping better at night Panic attack yesterday, Zoloft increased. Pam discussed deep breathing with her   ROS: Nausea improved, headaches improved, insomnia improved, +panic attack with therapy yesterday Objective:   No results found. No results for input(s): "WBC", "HGB", "HCT", "PLT" in the last 72 hours.   Recent Labs    10/06/22 1457 10/07/22 0556  NA 136 138  K 3.3* 3.7  CL 104 103  CO2 25 26  GLUCOSE 135* 121*  BUN 14 16  CREATININE 0.85 0.84  CALCIUM 9.3 9.2      Intake/Output Summary (Last 24 hours) at 10/07/2022 1003 Last data filed at 10/06/2022 1842 Gross per 24 hour  Intake 480 ml  Output --  Net 480 ml         Physical Exam: Vital Signs Blood pressure (!) 142/90, pulse 75, temperature 97.9 F (36.6 C), resp. rate 18, height 5\' 4"  (1.626 m), weight 87.8 kg, SpO2 100 %.   General: awake, alert, appropriate,  HENT: conjugate gaze; oropharynx moist CV: regular rate; no JVD Pulmonary: CTA B/L; no W/R/R- good air movement GI: soft, less TTP- ND; hypoactive BS, loose stools have resolved Psychiatric: appropriate- sleepy Neurological: Ox2  Ext: no clubbing, cyanosis, or edema, tight left heel cord Psych: positive spirits now, but does have anxiety with therapy Skin: No evidence of breakdown, no evidence of rash Neurologic: slow to arouse. Follows commands.  Right sided motor strength is grossly 5/5,    LUE 3- at deltoid , bi, tri, grip, 3- Left HF, KE, 2- ADF  Sensory exam - pt does not attend consistently to participate      Assessment/Plan: 1. Functional deficits which require 3+ hours per day of interdisciplinary therapy in a comprehensive inpatient rehab setting. Physiatrist is providing close team supervision and 24 hour management of active medical problems listed below. Physiatrist and rehab team continue to assess  barriers to discharge/monitor patient progress toward functional and medical goals  Care Tool:  Bathing    Body parts bathed by patient: Left arm, Chest, Abdomen, Front perineal area, Buttocks, Right upper leg, Left upper leg, Face, Right arm   Body parts bathed by helper: Left lower leg, Right arm Body parts n/a: Right lower leg, Left lower leg   Bathing assist Assist Level: Minimal Assistance - Patient > 75%     Upper Body Dressing/Undressing Upper body dressing   What is the patient wearing?: Pull over shirt    Upper body assist Assist Level: Set up assist    Lower Body Dressing/Undressing Lower body dressing      What is the patient wearing?: Underwear/pull up, Pants     Lower body assist Assist for lower body dressing: Minimal Assistance - Patient > 75%     Toileting Toileting    Toileting assist Assist for toileting: Minimal Assistance - Patient > 75%     Transfers Chair/bed transfer  Transfers assist     Chair/bed transfer assist level: Minimal Assistance - Patient > 75%     Locomotion Ambulation   Ambulation assist      Assist level: Moderate Assistance - Patient 50 - 74% Assistive  device: Parallel bars Max distance: 8 ft   Walk 10 feet activity   Assist  Walk 10 feet activity did not occur: Safety/medical concerns        Walk 50 feet activity   Assist Walk 50 feet with 2 turns activity did not occur: Safety/medical concerns         Walk 150 feet activity   Assist Walk 150 feet activity did not occur: Safety/medical concerns         Walk 10 feet on uneven surface  activity   Assist Walk 10 feet on uneven surfaces activity did not occur: Safety/medical concerns         Wheelchair     Assist Is the patient using a wheelchair?: Yes Type of Wheelchair: Manual    Wheelchair assist level: Dependent - Patient 0%      Wheelchair 50 feet with 2 turns activity    Assist        Assist Level: Dependent -  Patient 0%   Wheelchair 150 feet activity     Assist      Assist Level: Dependent - Patient 0%   Blood pressure (!) 142/90, pulse 75, temperature 97.9 F (36.6 C), resp. rate 18, height 5\' 4"  (1.626 m), weight 87.8 kg, SpO2 100 %.    Medical Problem List and Plan: 1. 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  Impaired mobility: continue Lovenox for DVT ppx. Add SCDs 3. Headache: improved. d/c topamax. Decrease oxycodone to q6H prn for severe pain, and Fioricet prn.  4. Anxiety: change Zoloft to HS, increased to 75mg  5. Neuropsych/cognition: This patient is capable of making decisions on her own behalf. 6. Skin/Wound Care: Routine pressure relief measures.  7. Fluids/Electrolytes/Nutrition: encourage PO -BUN still elevated 26 but trending down from 1/12 lab (30). 8. HTN: Monitor BP TID. Was not compliant with medications--does not want meds/doesn't like meds, so was noncompliant at home with meds prescribed.  -d/c amlodipine, increase magnesium to 500mg  HS --On Norvasc, Cozaar, Hydralazine and Lopressor.             --SBP goal 130-150 range.   -ordered IV hydralazine 10mg  for SBP >160 9. Hyperlipidemia: LDL 206- on Crestor. 10. Hyponatremia: Question SIADH. Recheck sodium today.  11. Leucocytosis: Likely reactive--still sl elevated  -afebrile --No signs of infection.  12. Acute on chronic Headaches w/ nausea: Took 4 aleve daily.  --Will add Claritin and nose spray to help with congestion as could be exacerbating HA --Improved, d/c topamax. Added magnesium gluconate 250mg  HS 13. B12 deficiency: given IM B12 given 1/11, discussed that she may benefit from these monthly, or increasing consumption of meat.  14. Left wrist pain: discussed that XR  results are negative 15. AKI: Cr elevated, IVF started.  16. Pseudobulbar affect: Nudexta discontinued since patient started feeling nauseous after it was given 17. Insomnia: d/c trazodone, d/c topamax, add melatonin 3mg  HS, grounds pass ordered. 18. Hypokalemia: improved, monitor daily and supplement to goal of 4 19. Daytime somnolence: d/c klonopin and daytime topamax 20. Hyponatremia: improving, monitor daily.  21. Nausea: resolved, d/c zofran. 22. Transaminitis: likely 2/2 hydralazine  and statin: d/ced. LFTs reviewed and improving 23. Drug induced rash: Antibiotics d/ced. Resolved.  24. Suboptimal magnesium: improved to normal with oral supplementation    LOS: 17 days A FACE TO FACE EVALUATION WAS PERFORMED  Kelly Stephenson Kelly Stephenson 10/07/2022, 10:03 AM

## 2022-10-07 NOTE — Progress Notes (Signed)
Patient got hot and lightheaed with ST. Reports she usually feels better when she lies down with these episodes. Note that patient having BP drop on standing and likely symptomatic as multiple meds at steady state and not used to having BP controlled. Will decrease cozaar to 50 mg (gets this at 6 am). Change Norvasc to 10 mg at bedtime to spread out meds-->had nurse hold it this am. Will decrease metoprolol to 75 mg bid (got 100 mg this am).

## 2022-10-07 NOTE — Progress Notes (Addendum)
Speech Language Pathology Daily Session Note  Patient Details  Name: Kelly Stephenson MRN: [id number] Date of Birth: May 04, 1971  Today's Date: 10/07/2022 SLP Individual Time: 0930-1000 SLP Individual Time Calculation (min): 30 min  Short Term Goals: Week 3: SLP Short Term Goal 1 (Week 3): STG's = LTG's due to ELOS  Skilled Therapeutic Interventions: Pt seen this date for skilled ST intervention targeting cognitive goals outlined in care plan. Pt received awake, though appearing anxious, lying supine in bed (NT reported pt had another panic attack this AM and has been flustered ever since). Agreeable to intervention with encouragement. Transferred to w/c with Min A + 1 squat pivot with pt verbalizing understanding re: safety measures and sequence for successful transfer with overall Sup A. Took pt to dayroom to see if distraction would be helpful with reducing anxiety. Unfortunately, pt unable to calm despite use of breathing techniques and required Min-Mod A verbal cues (choice prompts) to problem-solve how to decrease her anxiety. With choice prompts, pt able to select going back to bed and drinking water. PA notified of anxiety and requested vitals. Pt transferred back to bed with Sup A again for sequencing and safety during transfer. Politely declined additional therapy. Requested RN to come to her room to provide AM meds.  Vitals taken BP 123/83 Pulse: 71 SpO2: 100% Temp: 97.7   Due to anxiety and pt's decline to participate in further therapy, pt missed 30 minutes of skilled ST intervention. RN and family present at the end of today's session. All safety measures activated and immediate needs met. Continue per current ST POC.    Pain No pain reported  Therapy/Group: Individual Therapy  Grettell Ransdell A Venissa Nappi 10/07/2022, 10:16 AM

## 2022-10-08 DIAGNOSIS — F482 Pseudobulbar affect: Secondary | ICD-10-CM

## 2022-10-08 NOTE — Progress Notes (Signed)
Occupational Therapy Session Note  Patient Details  Name: Kelly Stephenson MRN: [id number] Date of Birth: 05-20-1971  Today's Date: 10/08/2022 OT Individual Time: 6811-5726 OT Individual Time Calculation (min): 43 min    Short Term Goals: Week 3:  OT Short Term Goal 1 (Week 3): Patient wilL utilize LUE to aide with hygiene and self feeding with only initial cueing OT Short Term Goal 2 (Week 3): Patient will tie shoes with min assist using bimanual technique OT Short Term Goal 3 (Week 3): Patient will complete toilet/ shower transfer with contact guard assist OT Short Term Goal 4 (Week 3): Patient will complete toileting with contact guard and cueing OT Short Term Goal 5 (Week 3): Patient will don upper body pull over shirt independently  Skilled Therapeutic Interventions/Progress Updates:    Patient received awake - supine in bed, ready for OT session.  Ambulated to bathroom for continent void and shower.  Patient completed toileting with close supervision and increased time.  Able to shower with supervision and verbal cueing.  Patient anxious regarding sit to stand, but did well with cueing, grab abr and adequate foot alignment.  Patient tearful today - "mourning the loss of myself"  Limited frustration tolerance.  Patient assisted back to bed at end of session.  Bed alarm set and call bell/ personal items in reach.    Therapy Documentation Precautions:  Precautions Precautions: Fall Precaution Comments: L hemi Restrictions Weight Bearing Restrictions: No   Pain:  Denies pain    Therapy/Group: Individual Therapy  [name] 10/08/2022, 9:20 AM

## 2022-10-08 NOTE — Progress Notes (Signed)
Occupational Therapy Session Note  Patient Details  Name: Kelly Stephenson MRN: [id number] Date of Birth: Dec 09, 1970  Today's Date: 10/08/2022 OT Individual Time: 1415-1500 OT Individual Time Calculation (min): 45 min    Short Term Goals: Week 3:  OT Short Term Goal 1 (Week 3): Patient wilL utilize LUE to aide with hygiene and self feeding with only initial cueing OT Short Term Goal 2 (Week 3): Patient will tie shoes with min assist using bimanual technique OT Short Term Goal 3 (Week 3): Patient will complete toilet/ shower transfer with contact guard assist OT Short Term Goal 4 (Week 3): Patient will complete toileting with contact guard and cueing OT Short Term Goal 5 (Week 3): Patient will don upper body pull over shirt independently  Skilled Therapeutic Interventions/Progress Updates:    Patient received supine in bed agreeable to OT session. Transported to gym for time.  Working on increasing independence with squat pivot transfers in both directions. Once on mat transitioned to 4 point, and worked on loading left limbs.  Patient fatigues quickly in this position but able to transition into and out of it with minimal assistance.  Patient reports discomfort along vertebral border of left scapula - provided manual soft tissue mobilization along border as well as scapular mobilization.  Reported improvement in symptoms.  Worked on seated coordination, tossing and catching a ball.  Patient transported back to room, and back to bed.  Bed alarm engaged and call bell/ personal items in reach.    Therapy Documentation Precautions:  Precautions Precautions: Fall Precaution Comments: L hemi Restrictions Weight Bearing Restrictions: No  Pain: Pain Assessment Pain Scale: 0-10 Pain Score: 0-No pain      Therapy/Group: Individual Therapy  [name] 10/08/2022, 4:04 PM

## 2022-10-08 NOTE — Progress Notes (Signed)
Speech Language Pathology Daily Session Note  Patient Details  Name: Kelly Stephenson MRN: [id number] Date of Birth: [birth year]  Today's Date: 10/08/2022 SLP Individual Time: 1300-1355 SLP Individual Time Calculation (min): 55 min  Short Term Goals: Week 3: SLP Short Term Goal 1 (Week 3): STG's = LTG's due to ELOS  Skilled Therapeutic Interventions: Pt seen this date for skilled ST intervention targeting cognitive goals outlined in care plan. Pt received awake/alert and sitting OOB in w/c. Family present. Agreeable to intervention in speech office. Participatory despite appearing anxious. Utilizing pursed lip breathing technique throughout session.  SLP facilitated problem-solving, awareness, attention, and recall by providing pt with opportunity to engage in medication management task with QID pill organizer. SLP provided list of current medications, dosages, purpose, and frequency to aid in awareness and agency. When asked, pt unable to verbalize the purpose of her current medications despite endorsing that she has heard the name of the medications before. Therefore, SLP provided purpose of currently scheduled medications. Pt placed 6 out of 6 medications in pill organizer appropriately given overall Sup A. Provided verbal education re: executive functioning strategies to include planning out placement and getting all needs materials before beginning task, placing completed pill bottles separate from bottles that have not yet been placed, and double checking for accuracy; pt verbalized and demonstrated understanding of techniques following verbal and demonstration prompts.  Lastly, pt completed sorting task of ingredients needed for predetermined recipes with ~90% accuracy given overall Sup A. Pt attended to various tasks for up to 30 minutes at a time without cues for redirection. Provided education on compensatory memory strategies (WRAP strategies) with pt stating that she utilizes  "associations" often. Recalled 12/18 food items on grocery list without delay; 14/18 following delay with distractions with improved use of compensatory memory strategies noted. Recalled all 18 items given Min A verbal cues.   Pt returned to room and left OOB in w/c with all safety measures activated and call bell within reach. Continue per current ST POC. Recommend supervision with medication and complex problem-solving tasks upon d/c.  Pain No pain reported; NAD  Therapy/Group: Individual Therapy  Artrell Lawless A Rukiya Hodgkins 10/08/2022, 1:55 PM

## 2022-10-08 NOTE — Progress Notes (Signed)
PROGRESS NOTE   Subjective/Complaints: Pt reports good night. Denies any anxiety/panic attacks since last report. Did have ?orthostatic episode yesterday in therapy.    ROS: Patient denies fever, rash, sore throat, blurred vision, dizziness, nausea, vomiting, diarrhea, cough, shortness of breath or chest pain, joint or back/neck pain, headache, or  .    Objective:   No results found. No results for input(s): "WBC", "HGB", "HCT", "PLT" in the last 72 hours.   Recent Labs    10/06/22 1457 10/07/22 0556  NA 136 138  K 3.3* 3.7  CL 104 103  CO2 25 26  GLUCOSE 135* 121*  BUN 14 16  CREATININE 0.85 0.84  CALCIUM 9.3 9.2      Intake/Output Summary (Last 24 hours) at 10/08/2022 1208 Last data filed at 10/08/2022 0954 Gross per 24 hour  Intake 354 ml  Output --  Net 354 ml         Physical Exam: Vital Signs Blood pressure 135/76, pulse 70, temperature 97.9 F (36.6 C), resp. rate 17, height 5\' 4"  (1.626 m), weight 87.8 kg, SpO2 97 %.   Constitutional: No distress . Vital signs reviewed. HEENT: NCAT, EOMI, oral membranes moist Neck: supple Cardiovascular: RRR without murmur. No JVD    Respiratory/Chest: CTA Bilaterally without wheezes or rales. Normal effort    GI/Abdomen: BS +, non-tender, non-distended Ext: no clubbing, cyanosis, or edema Psych: pleasant and cooperative  Musc: tight left heel cord  Skin: No evidence of breakdown, no evidence of rash Neurologic: slow to arouse. Follows commands.  Right sided motor strength is grossly 5/5,    LUE 3- at deltoid , bi, tri, grip, 3- Left HF, KE, 2- ADF  Sensory exam - pt does not attend consistently       Assessment/Plan: 1. Functional deficits which require 3+ hours per day of interdisciplinary therapy in a comprehensive inpatient rehab setting. Physiatrist is providing close team supervision and 24 hour management of active medical problems listed  below. Physiatrist and rehab team continue to assess barriers to discharge/monitor patient progress toward functional and medical goals  Care Tool:  Bathing    Body parts bathed by patient: Left arm, Chest, Abdomen, Front perineal area, Buttocks, Right upper leg, Left upper leg, Face, Right arm   Body parts bathed by helper: Left lower leg, Right arm Body parts n/a: Right lower leg, Left lower leg   Bathing assist Assist Level: Minimal Assistance - Patient > 75%     Upper Body Dressing/Undressing Upper body dressing   What is the patient wearing?: Pull over shirt    Upper body assist Assist Level: Set up assist    Lower Body Dressing/Undressing Lower body dressing      What is the patient wearing?: Underwear/pull up, Pants     Lower body assist Assist for lower body dressing: Minimal Assistance - Patient > 75%     Toileting Toileting    Toileting assist Assist for toileting: Minimal Assistance - Patient > 75%     Transfers Chair/bed transfer  Transfers assist     Chair/bed transfer assist level: Minimal Assistance - Patient > 75%     Locomotion Ambulation   Ambulation assist  Assist level: Moderate Assistance - Patient 50 - 74% Assistive device: Parallel bars Max distance: 8 ft   Walk 10 feet activity   Assist  Walk 10 feet activity did not occur: Safety/medical concerns        Walk 50 feet activity   Assist Walk 50 feet with 2 turns activity did not occur: Safety/medical concerns         Walk 150 feet activity   Assist Walk 150 feet activity did not occur: Safety/medical concerns         Walk 10 feet on uneven surface  activity   Assist Walk 10 feet on uneven surfaces activity did not occur: Safety/medical concerns         Wheelchair     Assist Is the patient using a wheelchair?: Yes Type of Wheelchair: Manual    Wheelchair assist level: Dependent - Patient 0%      Wheelchair 50 feet with 2 turns  activity    Assist        Assist Level: Dependent - Patient 0%   Wheelchair 150 feet activity     Assist      Assist Level: Dependent - Patient 0%   Blood pressure 135/76, pulse 70, temperature 97.9 F (36.6 C), resp. rate 17, height 5\' 4"  (1.626 m), weight 87.8 kg, SpO2 97 %.    Medical Problem List and Plan: 1. 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  Impaired mobility: continue Lovenox for DVT ppx. Add SCDs 3. Headache: improved. d/c topamax. Decrease oxycodone to q6H prn for severe pain, and Fioricet prn.  4. Anxiety: change Zoloft to HS, increased to 75mg  5. Neuropsych/cognition: This patient is capable of making decisions on her own behalf. 6. Skin/Wound Care: Routine pressure relief measures.  7. Fluids/Electrolytes/Nutrition: encourage PO -BUN still elevated 26 but trending down from 1/12 lab (30). 8. HTN: Monitor BP TID. Was not compliant with medications--does not want meds/doesn't like meds, so was noncompliant at home with meds prescribed.  -d/c amlodipine, increase magnesium to 500mg  HS --On Norvasc, Cozaar, Hydralazine and Lopressor.             --SBP goal 130-150 range.   -ordered IV hydralazine 10mg  for SBP >160  1/27 orthostatic yesterday in therapy. Bp's lower   -cozaar decreased to 50mg , norvasc changed to PM, metoprolol decreased   -no changes today as bp looks good--obsv with activity   -encourage fluids 9. Hyperlipidemia: LDL 206- on Crestor. 10. Hyponatremia: Question SIADH. Recheck sodium today.  11. Leucocytosis: Likely reactive--still sl elevated  -afebrile --No signs of infection.  12. Acute on chronic Headaches w/ nausea: Took 4 aleve daily.  --added Claritin and nose spray to  help with congestion as could be exacerbating HA --Improved, off topamax. Added magnesium gluconate 250mg  HS 13. B12 deficiency: given IM B12 given 1/11, discussed that she may benefit from these monthly, or increasing consumption of meat.  14. Left wrist pain: discussed that XR results are negative 15. AKI: Cr elevated, IVF started.  16. Pseudobulbar affect: Nudexta discontinued since patient started feeling nauseous after it was given 17. Insomnia:  d/c trazodone, d/c topamax, add melatonin 3mg  HS, grounds pass ordered. 18. Hypokalemia: improved, monitor daily and supplement to goal of 4 19. Daytime somnolence: d/c klonopin and daytime topamax 20. Hyponatremia: improving, monitor daily.  21. Nausea: resolved, d/c zofran. 22. Transaminitis: likely 2/2 hydralazine and statin: d/ced. LFTs reviewed and improving 23. Drug induced rash: Antibiotics d/ced. Resolved.  24. Suboptimal magnesium: improved to normal with oral supplementation    LOS: 18 days A FACE TO FACE EVALUATION WAS PERFORMED  Kelly Stephenson 10/08/2022, 12:08 PM

## 2022-10-09 NOTE — Progress Notes (Signed)
Speech Language Pathology Daily Session Note  Patient Details  Name: Kelly Stephenson MRN: [id number] Date of Birth: 09-Jan-1971  Today's Date: 10/09/2022 SLP Individual Time: 1358-1430 SLP Individual Time Calculation (min): 32 min  Short Term Goals: Week 2: SLP Short Term Goal 1 (Week 2): Patient will orient x4 with sup A for use of external aids. SLP Short Term Goal 1 - Progress (Week 2): Met SLP Short Term Goal 2 (Week 2): Patient will utilize memory compensations to recall biographical and/or novel information with min-mod A verbal/visual cues SLP Short Term Goal 2 - Progress (Week 2): Met SLP Short Term Goal 3 (Week 2): Patient will sustain attention to functional tasks for 7 minute intervals with min A verbal redirection cues SLP Short Term Goal 3 - Progress (Week 2): Met SLP Short Term Goal 4 (Week 2): Patient will complete basic complex problem solving with min-mod A verbal/visual cues to achieve 80% accuracy SLP Short Term Goal 4 - Progress (Week 2): Met SLP Short Term Goal 5 (Week 2): Patient will participate in further cognitive-linguistic evaluation to full completion for ongoing goal development SLP Short Term Goal 5 - Progress (Week 2): Met  Skilled Therapeutic Interventions:   Pt seen for skilled SLP intervention to address cognitive goals. Addressed attention and high level problem solving with visual scanning task and deductive reasoning tasks. Pt completed visual scanning task x2 with 100% accuracy and excellent efficiency. She completed x2 deductive reasoning puzzles ranging form easy-moderate level of difficulty. Completed puzzles with 100% accuracy independently.   Pt left in bed with alarm set and  family member present. Call bell in reach. Recommend continue SLP PoC to address cognitive goals.   Pain Pain Assessment Pain Scale: 0-10 Pain Score: 0-No pain  Therapy/Group: Individual Therapy  Wyn Forster 10/09/2022, 2:10 PM

## 2022-10-09 NOTE — Progress Notes (Signed)
PROGRESS NOTE   Subjective/Complaints: Reasonable night again. Feels that she's doing better with her anxiety  ROS: Patient denies fever, rash, sore throat, blurred vision, dizziness, nausea, vomiting, diarrhea, cough, shortness of breath or chest pain, joint or back/neck pain, headache   Objective:   No results found. No results for input(s): "WBC", "HGB", "HCT", "PLT" in the last 72 hours.   Recent Labs    10/06/22 1457 10/07/22 0556  NA 136 138  K 3.3* 3.7  CL 104 103  CO2 25 26  GLUCOSE 135* 121*  BUN 14 16  CREATININE 0.85 0.84  CALCIUM 9.3 9.2      Intake/Output Summary (Last 24 hours) at 10/09/2022 0944 Last data filed at 10/08/2022 2300 Gross per 24 hour  Intake 617 ml  Output --  Net 617 ml         Physical Exam: Vital Signs Blood pressure 136/67, pulse 83, temperature 98 F (36.7 C), temperature source Oral, resp. rate 18, height 5\' 4"  (1.626 m), weight 87.8 kg, SpO2 98 %.   Constitutional: No distress . Vital signs reviewed. HEENT: NCAT, EOMI, oral membranes moist Neck: supple Cardiovascular: RRR without murmur. No JVD    Respiratory/Chest: CTA Bilaterally without wheezes or rales. Normal effort    GI/Abdomen: BS +, non-tender, non-distended Ext: no clubbing, cyanosis, or edema Psych: pleasant and cooperative  Musc:   left heel cord still tight Skin: No evidence of breakdown, no evidence of rash Neurologic: easily aroused.. Follows commands.  Right sided motor strength is grossly 5/5,    LUE 3- at deltoid , bi, tri, grip, 3- Left HF, KE, 2- ADF  Sensory exam - pt does not attend consistently       Assessment/Plan: 1. Functional deficits which require 3+ hours per day of interdisciplinary therapy in a comprehensive inpatient rehab setting. Physiatrist is providing close team supervision and 24 hour management of active medical problems listed below. Physiatrist and rehab team continue  to assess barriers to discharge/monitor patient progress toward functional and medical goals  Care Tool:  Bathing    Body parts bathed by patient: Left arm, Chest, Abdomen, Front perineal area, Buttocks, Right upper leg, Left upper leg, Face, Right arm   Body parts bathed by helper: Left lower leg, Right arm Body parts n/a: Right lower leg, Left lower leg   Bathing assist Assist Level: Minimal Assistance - Patient > 75%     Upper Body Dressing/Undressing Upper body dressing   What is the patient wearing?: Pull over shirt    Upper body assist Assist Level: Set up assist    Lower Body Dressing/Undressing Lower body dressing      What is the patient wearing?: Underwear/pull up, Pants     Lower body assist Assist for lower body dressing: Minimal Assistance - Patient > 75%     Toileting Toileting    Toileting assist Assist for toileting: Minimal Assistance - Patient > 75%     Transfers Chair/bed transfer  Transfers assist     Chair/bed transfer assist level: Minimal Assistance - Patient > 75%     Locomotion Ambulation   Ambulation assist      Assist level: Moderate Assistance -  Patient 50 - 74% Assistive device: Parallel bars Max distance: 8 ft   Walk 10 feet activity   Assist  Walk 10 feet activity did not occur: Safety/medical concerns        Walk 50 feet activity   Assist Walk 50 feet with 2 turns activity did not occur: Safety/medical concerns         Walk 150 feet activity   Assist Walk 150 feet activity did not occur: Safety/medical concerns         Walk 10 feet on uneven surface  activity   Assist Walk 10 feet on uneven surfaces activity did not occur: Safety/medical concerns         Wheelchair     Assist Is the patient using a wheelchair?: Yes Type of Wheelchair: Manual    Wheelchair assist level: Dependent - Patient 0%      Wheelchair 50 feet with 2 turns activity    Assist        Assist Level:  Dependent - Patient 0%   Wheelchair 150 feet activity     Assist      Assist Level: Dependent - Patient 0%   Blood pressure 136/67, pulse 83, temperature 98 F (36.7 C), temperature source Oral, resp. rate 18, height 5\' 4"  (1.626 m), weight 87.8 kg, SpO2 98 %.    Medical Problem List and Plan: 1. 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  Impaired mobility: continue Lovenox for DVT ppx. Add SCDs 3. Headache: improved. d/c topamax. Decrease oxycodone to q6H prn for severe pain, and Fioricet prn.  4. Anxiety: change Zoloft to HS, increased to 75mg   -1/28 ?improving 5. Neuropsych/cognition: This patient is capable of making decisions on her own behalf. 6. Skin/Wound Care: Routine pressure relief measures.  7. Fluids/Electrolytes/Nutrition: encourage PO -BUN still elevated 26 but trending down from 1/12 lab (30). 8. HTN: Monitor BP TID. Was not compliant with medications--does not want meds/doesn't like meds, so was noncompliant at home with meds prescribed.  -d/c amlodipine, increase magnesium to 500mg  HS --On Norvasc, Cozaar, Hydralazine and Lopressor.             --SBP goal 130-150 range.   -ordered IV hydralazine 10mg  for SBP >160  1/28 orthostasis better yesterday it appears   -cozaar recently decreased to 50mg , norvasc changed to PM, metoprolol decreased   -bp looks good today, continue with above regimen   -encourage fluids 9. Hyperlipidemia: LDL 206- on Crestor. 10. Hyponatremia: Question SIADH. Recheck sodium today.  11. Leucocytosis: Likely reactive--still sl elevated  -afebrile --No signs of infection.  12. Acute on chronic Headaches w/ nausea: Took 4 aleve daily.  --added Claritin and nose spray to help with  congestion as could be exacerbating HA --Improved, off topamax. Added magnesium gluconate 250mg  HS 13. B12 deficiency: given IM B12 given 1/11, discussed that she may benefit from these monthly, or increasing consumption of meat.  14. Left wrist pain: discussed that XR results are negative 15. AKI: Cr elevated, IVF started.  16. Pseudobulbar affect: Nudexta discontinued since patient started feeling nauseous after it was given  -some  of PBA just pt's anxiety as well.  17. Insomnia: d/c trazodone, d/c topamax, add melatonin 3mg  HS, grounds pass ordered. 18. Hypokalemia: improved, monitor daily and supplement to goal of 4 19. Daytime somnolence: d/c klonopin and daytime topamax 20. Hyponatremia: improving, monitor daily.  21. Nausea: resolved, d/c zofran. 22. Transaminitis: likely 2/2 hydralazine and statin: d/ced. LFTs reviewed and improving 23. Drug induced rash: Antibiotics d/ced. Resolved.  24. Suboptimal magnesium: improved to normal with oral supplementation    LOS: 19 days A FACE TO FACE EVALUATION WAS PERFORMED  Meredith Staggers 10/09/2022, 9:44 AM

## 2022-10-09 NOTE — Progress Notes (Signed)
Physical Therapy Session Note  Patient Details  Name: Kelly Stephenson MRN: [id number] Date of Birth: 19-Apr-1971  Today's Date: 10/09/2022 PT Individual Time: 1001-1045 PT Individual Time Calculation (min): 44 min   Short Term Goals: Week 3:  PT Short Term Goal 1 (Week 3): STG = LTG due to ELOS  Skilled Therapeutic Interventions/Progress Updates:     Pt received in bed asleep. Easily awakens to verbal stimuli and agrees to therapy. No complaint of pain. Pt perform supine to sit with bed features and cues for positioning. Pt dons socks and shoes at EOB with setup assistance and PT providing assist with shoes. Stand pivot to [facility] with minA and cues for initiation and sequencing. WC transport to gym. Pt performs sit to stand with RW and CGA. Pt ambulates x100' with RW, with CGA and cues for reciprocal gait pattern and increasing R stride length for swing through pattern. PT also provides cues for increasing control of TKE to prevent snapping into hyperextension. Pt verbalizes anxiety about mobility before and during ambulation, and PT provides distraction techniques to manage anxiety. Following rest break, pt performs x10 foot taps with R lower extremity on 3.5" step, with cue to fully shifting weight from L<>R during each transitional movement to work on motor control and stability of L knee during stance phase. Pt takes rest break. Pt then attempts same activity without AD, to increase challenge and loading through L hemibody. Pt completes x3 reps with good control but begins to hyperventilate and requires seated rest break. PT provides verbal encouragement and visualization techniques to manage anxiety. Eventually, pt agrees to attempt again, and completes x20 toe taps with R lower extremity. PT provides verbal and tactile cues for lateral weight shifting and engagement of L knee extensor muscles. WC transport back to room. Stand step to bed without AD, with minA and cues for weight shifting and  positioning. Left supine in bed with all needs within reach.   Therapy Documentation Precautions:  Precautions Precautions: Fall Precaution Comments: L hemi Restrictions Weight Bearing Restrictions: No   Therapy/Group: Individual Therapy  Breck Coons, PT, DPT 10/09/2022, 4:11 PM

## 2022-10-09 NOTE — Progress Notes (Signed)
Occupational Therapy Session Note  Patient Details  Name: Kelly Stephenson MRN: [id number] Date of Birth: 1971-02-19  Today's Date: 10/09/2022 OT Individual Time: 5974-1638 OT Individual Time Calculation (min): 40 min    Short Term Goals: Week 3:  OT Short Term Goal 1 (Week 3): Patient wilL utilize LUE to aide with hygiene and self feeding with only initial cueing OT Short Term Goal 2 (Week 3): Patient will tie shoes with min assist using bimanual technique OT Short Term Goal 3 (Week 3): Patient will complete toilet/ shower transfer with contact guard assist OT Short Term Goal 4 (Week 3): Patient will complete toileting with contact guard and cueing OT Short Term Goal 5 (Week 3): Patient will don upper body pull over shirt independently  Skilled Therapeutic Interventions/Progress Updates:  Pt received resting in bed for skilled OT session with focus on BADL retrianing. Pt agreeable to interventions, demonstrating overall pleasant mood. Pt with no reports of pain this session. OT offering intermediate rest breaks and positioning suggestions throughout session to address pain/fatigue and maximize participation/safety in session.   Pt taking medications upon OT arrival, observed using L-hand to hold medicine cup. Pt performs bed mobility with supervision + HOB elevated. Sitting EOB, pt eats breakfast, using L-hand for intermediate assist of container management. Pt dons UB/LB garments with item retrieval and CGA + RW for standing to don pants over bottom/hips.   Pt ambulates short distance to complete sink-side grooming activities seated in WC, performing STS with CGA+RW. Pt requires Min A for management of shirt sleeve to apply deodorant, completing hair brushing, oral care, and face washing with supervision and appropriate integration of L-hand.   Pt dependent for WC transport from room<>day room for time management. In day room, pt participates in gross digit flexion and rolling exercises with  use of tan theraputty. Pt completes 3 reps pf each exercise, before becoming increasingly anxious and tearful, stating "I'm just scared of what I might not be able to do." Pt provided with safe space to share thoughts/feelings and objective insight into progression towards OT goals.  Pt remained resting in bed with all immediate needs met at end of session. Pt continues to be appropriate for skilled OT intervention to promote further functional independence.   Therapy Documentation Precautions:  Precautions Precautions: Fall Precaution Comments: L hemi Restrictions Weight Bearing Restrictions: No   Therapy/Group: Individual Therapy  Maudie Mercury, OTR/L, MSOT  10/09/2022, 6:24 AM

## 2022-10-10 LAB — CBC WITH DIFFERENTIAL/PLATELET
Abs Immature Granulocytes: 0.05 10*3/uL (ref 0.00–0.07)
Basophils Absolute: 0.2 10*3/uL — ABNORMAL HIGH (ref 0.0–0.1)
Basophils Relative: 2 %
Eosinophils Absolute: 0.6 10*3/uL — ABNORMAL HIGH (ref 0.0–0.5)
Eosinophils Relative: 7 %
HCT: 40.3 % (ref 36.0–46.0)
Hemoglobin: 13.8 g/dL (ref 12.0–15.0)
Immature Granulocytes: 1 %
Lymphocytes Relative: 19 %
Lymphs Abs: 1.6 10*3/uL (ref 0.7–4.0)
MCH: 28.6 pg (ref 26.0–34.0)
MCHC: 34.2 g/dL (ref 30.0–36.0)
MCV: 83.6 fL (ref 80.0–100.0)
Monocytes Absolute: 0.8 10*3/uL (ref 0.1–1.0)
Monocytes Relative: 9 %
Neutro Abs: 5 10*3/uL (ref 1.7–7.7)
Neutrophils Relative %: 62 %
Platelets: 387 10*3/uL (ref 150–400)
RBC: 4.82 MIL/uL (ref 3.87–5.11)
RDW: 13.2 % (ref 11.5–15.5)
WBC: 8.1 10*3/uL (ref 4.0–10.5)
nRBC: 0 % (ref 0.0–0.2)

## 2022-10-10 LAB — COMPREHENSIVE METABOLIC PANEL
ALT: 74 U/L — ABNORMAL HIGH (ref 0–44)
AST: 23 U/L (ref 15–41)
Albumin: 3.6 g/dL (ref 3.5–5.0)
Alkaline Phosphatase: 207 U/L — ABNORMAL HIGH (ref 38–126)
Anion gap: 9 (ref 5–15)
BUN: 18 mg/dL (ref 6–20)
CO2: 25 mmol/L (ref 22–32)
Calcium: 9.7 mg/dL (ref 8.9–10.3)
Chloride: 102 mmol/L (ref 98–111)
Creatinine, Ser: 0.95 mg/dL (ref 0.44–1.00)
GFR, Estimated: >60 mL/min (ref >60)
Glucose, Bld: 106 mg/dL — ABNORMAL HIGH (ref 70–99)
Potassium: 4.3 mmol/L (ref 3.5–5.1)
Sodium: 136 mmol/L (ref 135–145)
Total Bilirubin: 0.5 mg/dL (ref 0.3–1.2)
Total Protein: 7.2 g/dL (ref 6.5–8.1)

## 2022-10-10 MED ORDER — CLONAZEPAM 0.125 MG PO TBDP: 0.1250 mg | ORAL_TABLET | Freq: Two times a day (BID) | ORAL | Status: DC | PRN

## 2022-10-10 NOTE — Progress Notes (Signed)
PROGRESS NOTE   Subjective/Complaints: Continues to have panic attacks- commended on her on continuing her breathing exercises. Discussed that it is normal for her to fear falling but we do not want it to affect her therapy  ROS: Patient denies fever, rash, sore throat, blurred vision, dizziness, nausea, vomiting, diarrhea, cough, shortness of breath or chest pain, joint or back/neck pain, headache, +panic attacks   Objective:   No results found. Recent Labs    10/10/22 0614  WBC 8.1  HGB 13.8  HCT 40.3  PLT 387     Recent Labs    10/10/22 0614  NA 136  K 4.3  CL 102  CO2 25  GLUCOSE 106*  BUN 18  CREATININE 0.95  CALCIUM 9.7      Intake/Output Summary (Last 24 hours) at 10/10/2022 1017 Last data filed at 10/09/2022 1542 Gross per 24 hour  Intake 200 ml  Output --  Net 200 ml         Physical Exam: Vital Signs Blood pressure 133/79, pulse 75, temperature 98.2 F (36.8 C), temperature source Oral, resp. rate 18, height 5\' 4"  (1.626 m), weight 87.8 kg, SpO2 98 %.   Constitutional: No distress . Vital signs reviewed. HEENT: NCAT, EOMI, oral membranes moist Neck: supple Cardiovascular: RRR without murmur. No JVD    Respiratory/Chest: CTA Bilaterally without wheezes or rales. Normal effort    GI/Abdomen: BS +, non-tender, non-distended Ext: no clubbing, cyanosis, or edema Psych: pleasant and cooperative, panic attacks reported with therapy Musc:   left heel cord still tight Skin: No evidence of breakdown, no evidence of rash Neurologic: easily aroused.. Follows commands.  Right sided motor strength is grossly 5/5,    LUE 3- at deltoid , bi, tri, grip, 3- Left HF, KE, 2- ADF  Sensory exam - pt does not attend consistently       Assessment/Plan: 1. Functional deficits which require 3+ hours per day of interdisciplinary therapy in a comprehensive inpatient rehab setting. Physiatrist is providing  close team supervision and 24 hour management of active medical problems listed below. Physiatrist and rehab team continue to assess barriers to discharge/monitor patient progress toward functional and medical goals  Care Tool:  Bathing    Body parts bathed by patient: Left arm, Chest, Abdomen, Front perineal area, Buttocks, Right upper leg, Left upper leg, Face, Right arm   Body parts bathed by helper: Left lower leg, Right arm Body parts n/a: Right lower leg, Left lower leg   Bathing assist Assist Level: Minimal Assistance - Patient > 75%     Upper Body Dressing/Undressing Upper body dressing   What is the patient wearing?: Pull over shirt    Upper body assist Assist Level: Set up assist    Lower Body Dressing/Undressing Lower body dressing      What is the patient wearing?: Underwear/pull up, Pants     Lower body assist Assist for lower body dressing: Minimal Assistance - Patient > 75%     Toileting Toileting    Toileting assist Assist for toileting: Moderate Assistance - Patient 50 - 74%     Transfers Chair/bed transfer  Transfers assist     Chair/bed transfer  assist level: Minimal Assistance - Patient > 75%     Locomotion Ambulation   Ambulation assist      Assist level: Moderate Assistance - Patient 50 - 74% Assistive device: Parallel bars Max distance: 8 ft   Walk 10 feet activity   Assist  Walk 10 feet activity did not occur: Safety/medical concerns        Walk 50 feet activity   Assist Walk 50 feet with 2 turns activity did not occur: Safety/medical concerns         Walk 150 feet activity   Assist Walk 150 feet activity did not occur: Safety/medical concerns         Walk 10 feet on uneven surface  activity   Assist Walk 10 feet on uneven surfaces activity did not occur: Safety/medical concerns         Wheelchair     Assist Is the patient using a wheelchair?: Yes Type of Wheelchair: Manual    Wheelchair  assist level: Dependent - Patient 0%      Wheelchair 50 feet with 2 turns activity    Assist        Assist Level: Dependent - Patient 0%   Wheelchair 150 feet activity     Assist      Assist Level: Dependent - Patient 0%   Blood pressure 133/79, pulse 75, temperature 98.2 F (36.8 C), temperature source Oral, resp. rate 18, height 5\' 4"  (1.626 m), weight 87.8 kg, SpO2 98 %.    Medical Problem List and Plan: 1. 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  Impaired mobility: continue Lovenox for DVT ppx. Add SCDs 3. Headache: improved. d/c topamax. Decrease oxycodone to q6H prn for severe pain, and Fioricet prn.  4. Anxiety: change Zoloft to HS, increased to 75mg . Discussed deep breathing exercises 5. Neuropsych/cognition: This patient is capable of making decisions on her own behalf. 6. Skin/Wound Care: Routine pressure relief measures.  7. Fluids/Electrolytes/Nutrition: encourage PO -BUN still elevated 26 but trending down from 1/12 lab (30). 8. HTN: Monitor BP TID. Was not compliant with medications--does not want meds/doesn't like meds, so was noncompliant at home with meds prescribed.  -d/c amlodipine, increase magnesium to 500mg  HS --On Norvasc, Cozaar, Hydralazine and Lopressor.             --SBP goal 130-150 range.   -ordered IV hydralazine 10mg  for SBP >160  1/28 orthostasis better yesterday it appears   -cozaar recently decreased to 50mg , norvasc changed to PM, metoprolol decreased   -bp looks good today, continue with above regimen   -encourage fluids 9. Hyperlipidemia: LDL 206- on Crestor. 10. Hyponatremia: Question SIADH. Recheck sodium today.  11. Leucocytosis: Likely reactive--still sl  elevated  -afebrile --No signs of infection.  12. Acute on chronic Headaches w/ nausea: Took 4 aleve daily.  --added Claritin and nose spray to help with congestion as could be exacerbating HA --Improved, off topamax. Added magnesium gluconate 250mg  HS 13. B12 deficiency: given IM B12 given 1/11, discussed that she may benefit from these monthly, or increasing consumption of meat.  14. Left wrist pain:  discussed that XR results are negative 15. AKI: Cr elevated, IVF started.  16. Pseudobulbar affect: Nudexta discontinued since patient started feeling nauseous after it was given  -some of PBA just pt's anxiety as well.  17. Insomnia: d/c trazodone, d/c topamax, add melatonin 3mg  HS, grounds pass ordered. 18. Hypokalemia: improved, monitor daily and supplement to goal of 4 19. Daytime somnolence: d/c klonopin and daytime topamax 20. Hyponatremia: improving, monitor daily.  21. Nausea: resolved, d/c zofran. 22. Transaminitis: likely 2/2 hydralazine and statin: d/ced. LFTs reviewed and improving, discussed with patient 41. Drug induced rash: Antibiotics d/ced. Resolved.  24. Suboptimal magnesium: improved to normal with oral supplementation 25. Hyperglycemia: advised to avoid added sugar 26. Low back pain: advised to try kpad or ice.     LOS: 20 days A FACE TO FACE EVALUATION WAS PERFORMED  30 Syris Brookens 10/10/2022, 10:17 AM

## 2022-10-10 NOTE — Progress Notes (Signed)
Physical Therapy Session Note  Patient Details  Name: Kelly Stephenson MRN: [id number] Date of Birth: April 20, 1971  Today's Date: 10/10/2022 PT Individual Time: 1100-1158 + 1445-1530 PT Individual Time Calculation (min): 58 min  + 45 min  Short Term Goals: Week 3:  PT Short Term Goal 1 (Week 3): STG = LTG due to ELOS  Skilled Therapeutic Interventions/Progress Updates:      1st session: Pt lying in bed with her husband and mother at the bedside. Pt in agreement to therapy session - reports upset stomach but no specific pains. Husband with reasonable questions regarding DC planning, follow up care, insurance benefits, etc - educated DC planning from PT standpoint and relayed concerns to CSW via secure chat.   Pt completed supine<>sitting EOB without assist. Able to scoot forwards to EOB without assist or cues. Donned tennis shoes with totalA for time.  Squat<>pivot transfer completed with CGA from EOB to w/c with patient able to reposition self. Transported in w/c to main rehab gym. Retrieved L AFO (walk on trimmable) and donned with totalA. Scheduled AFO consult tomorrow with [facility].  Gait training during session with CGA and RW - ambulating variable distances, ~50-163ft. Pt highly anxious, emotional and tearful throughout session, unable to explain why but suspect due to upcoming DC and improved clarity of limitations. No LOB while ambulating, slight genu recurvatum during terminal stance, and tone limiting step length at times.   Furniture transfers completed with CGA with cues needed for setup, hand placement, and tucking her LLE underneath her prior to standing. Discussed general DC planning during session to calm anxiety and instill confidence.   Practiced car transfers with car height simulating smaller sedan - completed with CGA and RW with cues for setup. Able to manage LE in/out without physical assist. Also practiced unlevel gait training on incline/decline ramp with CGA and  RW - pt expressing high fear of falling while navigating the ramp although no LOB or knee buckling observed - careful gait and min cues for safety, calming, and reassurance.   Returned to her room and assisted back to bed with CGA squat<>pivot transfer. Bed mobility completed without assist. Alarm on, all needs met, ice water provided.   2nd session: Pt in bed to start - in agreement to therapy. Mother at the bedside.   Supine<>sitting EOB with supervision, no physical assist. Donned tennis shoes with totalA for time. Difficulty donning with AFO due to incorrect AFO size - AFO consult tomorrow for better fit. Transported to main rehab gym for time management.  Stair negotiation training using 6inch steps and 2 hand rails. Navigated 3x4 steps with CGA with min cues for safety and for bringing awareness to her L hand that she tends to leave behind her. No knee buckling or LOB observed - step-to pattern while forward facing.  Standing there-ex in // bars for weight bearing and strengthening: -2x10 unilateral high knees on L -1x15 heel raises bilaterally -2x10 hip abd on L -4x26ft side stepping L<>R *Completed with CGA for safety and mirror for visual feedback. X1 sitting rest break needed 2/2 fatigue.  Discussed at length stroke recovery/prognosis, DME rec's (transport chair, RW), f/u therapy rec's (OPPT), etc. Patient less anxious this session compared to AM.   Returned to her room and assisted to bed via squat<>pivot CGA transfer. Bed mobility completed mod I. All needs met at end of session, mother at bedside.    Therapy Documentation Precautions:  Precautions Precautions: Fall Precaution Comments: L hemi Restrictions Weight  Bearing Restrictions: No General:    Therapy/Group: Individual Therapy  Alger Simons 10/10/2022, 7:44 AM

## 2022-10-10 NOTE — Progress Notes (Signed)
Patient continues to have orthostatic drops--will d/c amlodipine and monitor over next 2 days prior to d/c.

## 2022-10-10 NOTE — Progress Notes (Signed)
Occupational Therapy Session Note  Patient Details  Name: Kelly Stephenson MRN: [id number] Date of Birth: May 02, 1971  Today's Date: 10/10/2022 OT Individual Time: 4098-1191 OT Individual Time Calculation (min): 43 min    Short Term Goals: Week 3:  OT Short Term Goal 1 (Week 3): Patient wilL utilize LUE to aide with hygiene and self feeding with only initial cueing OT Short Term Goal 2 (Week 3): Patient will tie shoes with min assist using bimanual technique OT Short Term Goal 3 (Week 3): Patient will complete toilet/ shower transfer with contact guard assist OT Short Term Goal 4 (Week 3): Patient will complete toileting with contact guard and cueing OT Short Term Goal 5 (Week 3): Patient will don upper body pull over shirt independently  Skilled Therapeutic Interventions/Progress Updates:    Patient received sleeping in bed.  Reports reading the schedule wrong - "I guess I read the weekend schedule."  Patient agreeable to get up and out of bed - indicates need to void.  Reports having upset stomach last night - and having to get up multiple times to use the bathroom.  Patient transferred to wheelchair (declined walking to bathroom) with set up assist and supervision.  Patient transferred to/from toilet with supervision (close) and use of grab bars.  Patient walked from toilet to shower without device - using hand rails.  Showered with intermittent min assist.  Patient reporting mid back pain form sitting without support.  Patient crying regarding back pain.  Called for pain medication - and nursing staff arrived immediately with medication.  Patient completed hygiene and dressing at sink and transferred back to bed to relieve back pain.  Bed alarm engaged and call bell in reach - husband at bedside.    Therapy Documentation Precautions:  Precautions Precautions: Fall Precaution Comments: L hemi Restrictions Weight Bearing Restrictions: No   Pain: Pain Assessment Pain Scale: 0-10 Pain  Score: 5  Pain Type: Acute pain Pain Location: Back Pain Orientation: Lower Pain Descriptors / Indicators: Aching;Discomfort Pain Frequency: Intermittent Pain Onset: Gradual Patients Stated Pain Goal: 0 Pain Intervention(s): Medication (See eMAR)     Therapy/Group: Individual Therapy  [name] 10/10/2022, 12:30 PM

## 2022-10-11 MED ORDER — LORAZEPAM 0.5 MG PO TABS
1.0000 mg | ORAL_TABLET | Freq: Once | ORAL | Status: AC
  Administered 2022-10-11: 1 mg via ORAL
  Filled 2022-10-11: qty 2

## 2022-10-11 MED ORDER — CLONAZEPAM 0.125 MG PO TBDP
0.1250 mg | ORAL_TABLET | Freq: Two times a day (BID) | ORAL | Status: DC | PRN
  Administered 2022-10-11 – 2022-10-12 (×2): 0.125 mg via ORAL
  Filled 2022-10-11 (×2): qty 1

## 2022-10-11 MED ORDER — OXYCODONE HCL 5 MG PO TABS
5.0000 mg | ORAL_TABLET | Freq: Three times a day (TID) | ORAL | Status: DC | PRN
  Administered 2022-10-11 – 2022-10-12 (×2): 5 mg via ORAL
  Filled 2022-10-11 (×2): qty 1

## 2022-10-11 MED ORDER — POTASSIUM CHLORIDE 20 MEQ PO PACK
20.0000 meq | PACK | Freq: Two times a day (BID) | ORAL | Status: DC
  Administered 2022-10-11 – 2022-10-13 (×4): 20 meq via ORAL
  Filled 2022-10-11 (×4): qty 1

## 2022-10-11 MED ORDER — HALOPERIDOL 1 MG PO TABS
2.0000 mg | ORAL_TABLET | Freq: Once | ORAL | Status: AC
  Administered 2022-10-11: 2 mg via ORAL
  Filled 2022-10-11: qty 2

## 2022-10-11 NOTE — Progress Notes (Signed)
Physical Therapy Session Note  Patient Details  Name: SHAELY GADBERRY MRN: [id number] Date of Birth: Sep 12, 1971  Today's Date: 10/11/2022 PT Missed Time: 60 Minutes Missed Time Reason: Patient ill (Comment) (panic attack)  Short Term Goals: Week 3:  PT Short Term Goal 1 (Week 3): STG = LTG due to ELOS  Skilled Therapeutic Interventions/Progress Updates:      Pt experiencing what appears to be a panic attack - in bed, fidgity, moaning, and unable to be calmed. Team is aware. Patient unable to safely participate in therapy session at this time - will hold and reattempt as schedule permits. Pt missed 60 minutes of skilled therapy.    Therapy/Group: Individual Therapy  Jayliani Wanner P Mayanna Garlitz PT 10/11/2022, 7:34 AM

## 2022-10-11 NOTE — Progress Notes (Signed)
Occupational Therapy Session Note  Patient Details  Name: Kelly Stephenson MRN: [id number] Date of Birth: 1971/03/10  Today's Date: 10/11/2022 OT Individual Time: 0830-0900 OT Individual Time Calculation (min): 30 min    Short Term Goals: Week 1:  OT Short Term Goal 1 (Week 1): Patient will bathe upper body with min assist OT Short Term Goal 1 - Progress (Week 1): Met OT Short Term Goal 2 (Week 1): Patient will bathe lower body with mod assist OT Short Term Goal 2 - Progress (Week 1): Met OT Short Term Goal 3 (Week 1): Patient will complete toilet transfer with min assist OT Short Term Goal 3 - Progress (Week 1): Met OT Short Term Goal 4 (Week 1): Patient will don/doff pull over shirt with min assist OT Short Term Goal 4 - Progress (Week 1): Met OT Short Term Goal 5 (Week 1): Patient will don/doff elastic waist pants with mod assist OT Short Term Goal 5 - Progress (Week 1): Met Week 2:  OT Short Term Goal 1 (Week 2): Patient will don upper body clothing with set up and cueing OT Short Term Goal 1 - Progress (Week 2): Met OT Short Term Goal 2 (Week 2): Patient wilL utilize LUE to aide with hygiene and self feeding with only initial cueing OT Short Term Goal 2 - Progress (Week 2): Progressing toward goal OT Short Term Goal 3 (Week 2): Patient will tie shoes with min assist using bimanual technique OT Short Term Goal 3 - Progress (Week 2): Progressing toward goal OT Short Term Goal 4 (Week 2): Patient will complete toilet/ shower transfer with contact guard assist OT Short Term Goal 4 - Progress (Week 2): Progressing toward goal OT Short Term Goal 5 (Week 2): Patient will complete toileting with contact guard and cueing OT Short Term Goal 5 - Progress (Week 2): Progressing toward goal  Skilled Therapeutic Interventions/Progress Updates:    1:1 Pt received in the bed asleep as well as her husband next to her. Entered the room with SW. Discussed d/c recommendations for f/u therapy - pt  prefers HH due to not having a ride - and for DME. Discussed tub bench for access to shower and for safety.  Pt able to come to EOB with supervision from flat bed. Pt ambulated to the bathroom with RW with contact guard and encouragement. Pt with heavy breathing when arrived in the shower. Min cues for RW safety with turning to get into shower. Pt able to bathe all parts sit to stand with grab bar with contact guard. Unclear who will be providing the contact guard/ supervision at home at d/c that is recommended at home for safety and encouragement. Husband reported needing to look into FMLA. Pt able to dress sit to stand with setup and contact guard for standing. BP taken and was 110/70 Hr in 70s still heavy breathing. Asked to get back in bed to eat breakfast. Left resting in the bed.   Therapy Documentation Precautions:  Precautions Precautions: Fall Precaution Comments: L hemi Restrictions Weight Bearing Restrictions: No  Pain: No reports of pain; but pt reports fatigue and starts getting panicky   Therapy/Group: Individual Therapy  Willeen Cass [facility] 10/11/2022, 11:00 AM

## 2022-10-11 NOTE — Progress Notes (Signed)
Patient reported to have multiple breakdown today during family education (includes care and problem solving on situations/best way to handle issues after discharge). Followed up on patient who was lying in bed repeating "help me" but unable to engage with patient or have her answer any questions. She went on to slap her forehead, her hip and pull on rails with further questions therefore stopped line of questioning. Psych consulted for input.

## 2022-10-11 NOTE — Progress Notes (Addendum)
Speech Language Pathology Daily Session Note  Patient Details  Name: Kelly Stephenson MRN: [id number] Date of Birth: 1971-04-17  Today's Date: 10/11/2022 SLP Individual Time: 1100-1210 SLP Individual Time Calculation (min): 70 min  Short Term Goals: Week 3: SLP Short Term Goal 1 (Week 3): STG's = LTG's due to ELOS  Skilled Therapeutic Interventions: Skilled ST treatment focused on cognitive goals. Pt was greeted upright in wheelchair on arrival and accompanied by her mother. Pt appeared anxious and was observed to exhibit intentional pursed lip breathing throughout session. Pt stated she has been trying to focus on her breath as a way to decrease her anxiety. Pt stated it is helpful to get her mind off things and verbalized interest in leaving room for therapy for cognitive tasks. Took pt to quiet speech therapy office. SLP facilitated medication error identification with 712% accuracy at independent level. Pt demonstrated excellent understanding of medication labels, effective organization techniques, thorough assessment for error, and appropriate solutions for repair.   Following activity, SLP suggested re-assessing with SLUMS since pt will be discharging soon as a way to "check-in" on pt's progress. Pt agreeable. Before starting, SLP provided ongoing encouragement of pt's successes in rehab. Pt agreed and stated she was grateful to finally discharge home soon. Suddenly, pt reported her mouth felt dry and began to exhibit quick, shallow breaths. Pt shouted "something is wrong, I need help!" and then expressed her tongue felt numb. At this time, she proceeded with shouting and began to slap her face, chest, and knees, and was essentially inconsolable. Attempts to coach pt through deep breathing and grounding techniques were mostly ineffective. SLP ensuring pt was in a low stim environment and eventually took her back to her room to be in the presence of her mother which helped a little. These behaviors  lasted at this intensity for approximately 30 minutes. Pt also reporting her arms felt numb. HR was elevated to 115. O2 sats 98%. Unable to obtain blood pressure due to pt ripping blood pressure cuff off. SLP/Nurse/Mother provided ongoing emotional support, increased airflow in room, and providing cold washcloth to apply to face/neck. Also removed some clothing items per pt request d/t feeling warm. SLP and pt's mother assisted pt back to bed with sup A and pt immediately appeared more calm and relaxed and anxiety episode appeared to resolve. Nurse was present to administer medications per pt request and consent. Nurse informed medical team of significant anxiety. SLP notified therapy team.   Patient was left in bed with nurse at bedside. Continue per current plan of care.       Pain Pain Assessment Pain Scale: 0-10 Pain Score: 1   Therapy/Group: Individual Therapy  Patty Sermons 10/11/2022, 12:15 PM

## 2022-10-11 NOTE — Progress Notes (Signed)
Patient ID: Kelly Stephenson, female   DOB: [birth year], 52 y.o.   MRN: [id number]  Met with pt and husband to discuss questions and discharge needs. Husband will plan to use his sick leave from work to take a leave so he will get paid he has 2.5 months saved up. He will talk with HR today at his work-[employer]. Discussed the need to do hands on education for the next two days so feel comfortable with wife's care. Discussed equipment needs-transport chair, rw, tub bench. Husband to get rw on won since not covered and pt wants to do [facility] first feels may be difficult to get to OP therapies, but does need this due to young age and high level. Will ask PT and OT discuss with both.

## 2022-10-11 NOTE — Consult Note (Signed)
[facility] New Face-to-Face Psychiatric Evaluation   Service Date: October 11, 2022 LOS:  LOS: 21 days    Assessment  Kelly Stephenson is a 52 y.o. female admitted to rehab for 09/20/2022  2:35 PM after suffering an intracerebral hemorrhage. She carries no formal psychiatric diagnoses (chronic insomnia/anxiety per family) and has a past medical history of  hypertension, thalamic hemorrhage, and hemiplegia. Psychiatry was consulted for anxiety/psychosis by Kelly Stephenson.    Her current presentation of abrupt onset of a "breakdown" during speech therapy after being asked to complete a SLUMS is most consistent with a panic attack; on my exam she was curled in fetal position, shaking, and engaged minimally in interview; these symptoms worsened when asking about current mood. Had received small dose klonopin before being seen.  Other differential items considered include an excited delirium (pt did poorly on CAM-ICU, attention, although this is partially explained by panic/stroke), a conversion disorder (based on magnitude and duration of sx and clear trigger), a seizure disorder (recent hemorrhagic stroke, acute onset of AMS/repetitive movements), and an excited catatonia (husband described what sounded like posturing a week ago, however no rigidty, grasp, etc on exam today, also not c/w acute onset). Did have an episode of depersonalization/derealization earlier this hospitalization per family; nothing like that has ever happened before. Due to lack of engagement in interview, could not assess for most other psychiatric conditions today or make meaningful/thoughtful changes to current daily medication regimen. She has no significant psych history, although this was mostly obtained from husband and mother, and took no psychotropic medications prior to presentation. To combat acute symptoms we will be trying 2 mg haldol + 1 mg of ativan; discussed r/b/se of these medications with husband (who  provided informed consent) and pt (who nodded her assent).   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 This decision is based on my review of the chart including patient's history and current presentation, interview of the patient, mental status examination, and consideration of suicide risk including evaluating suicidal ideation, plan, intent, suicidal or self-harm behaviors, risk factors, and protective factors. This judgment is based on our ability to directly address suicide risk, implement suicide prevention strategies and develop a safety plan while the patient is in the clinical setting. Please contact our team if there is a concern that risk level has changed.   ## Medications:  -- pt on 75 mg sertraline per primary  -- ONE TIME order of haldol 2/ativan 1 (would have been OK with klonopin 0.5)   Other centrally acting meds include klonopin 0.125 BID PRN, melatonin 3 QHS   ## Medical Decision Making Capacity:  Functionally lacked at time of assessment; this will fluctuate; per notes generally makes own decisions.   ## Further Work-up:  -- none currently; consider EEG if AMS does not resolve by tomorrow    -- most recent EKG on 1/19 had  QtC of 432 -- Pertinent labwork reviewed earlier this admission includes: TSH WNL, B12 low 1/10, vitamin D low 1/11 d/w primary  ## Disposition:  -- per primary  ## Behavioral /  Environmental:  -- Pt responds well to medical team summarizing information rather than re-asking questions    Thank you for this consult request. Recommendations have been communicated to the primary team.  We will continue to follow at this time.   [place] A Camaryn Lumbert   New history  Relevant Aspects of Hospital Course:  Admitted on 09/20/2022 for an ICH and later transferred to CIR. Early CIR course complicated by what sounds like (per husband) a week of decreased energy, depersonalization, amotivation following a panic attack. Has generally had difficulty confronting current deficits per read of therapy notes. Multiple episodes of confusion noted through CIR course.  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   Patient Report:  Saw pt in room, lying in bed and fetal postion. She is oriented to self and to general situation. Interview is challenging - pt stating "okay" to many questions/statements (unclear how much she is really understanding) and repeating "help me, help me, help me". She did specifically deny suicidal ideations multiple times throughout interview. Sometimes slaps stomach, head, bed rails, but this is not sustained behavior. She denied any pain (head/stomach/abdomen), chest tightness, etc. Husband relates a similar event earlier in the hospitalization where afterwards she lay in bed for a week, occasionally referring to herself as "[name]"; does not remember seeing her responding to internal stimuli or being worried about hallucinations at that time. She responded throughout interview fairly well to soft voice, touch, etc.   BFRS: some verbigeration, stereotypy, however, other screening items largely (-); would use cutoff of 4 d/t concern for chronic encephalopathy   Fish in sea - no Does one pound weigh more than 2 - yes Does a hammer pound a nail - yes Does a stone float on water -   ROS:  Couldn't formally get  Collateral information:  From  husband/mom, in body of note   Psychiatric History:  Information collected from pt, family Chronic anxiety, insomnia No formalt x, therapy, hospitalization Type A personality   Does have periods where she is irritable when insomnia worsens, husband did not endorse any other sx of mania. Chronically high goal directed behavior unrelated to sleep cycling.   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   Tobacco use: deferred Alcohol use: deferred Drug use: deferred  Family History:  Mother stated no psych history in family, sister w/ sepression per EMR The patient's family history includes Depression in her sister; Diabetes in her father and sister; Glaucoma in her maternal uncle; Heart Problems in her father; Heart attack in her father; High blood pressure in her brother, father, mother, and sister; Hypertension in her father, maternal aunt, maternal uncle, and sister; Kidney disease in her maternal uncle; Obesity in her maternal aunt; Osteoporosis in her mother; Stroke in her father, maternal grandfather, paternal uncle, and another family member.  Medical History: Past Medical History:  Diagnosis Date   Anxiety    Chronic right shoulder pain    Gastrocnemius strain, left    did outpatient Tx w/ improvement   Hypertension    Insomnia     Surgical History: Past Surgical History:  Procedure Laterality Date   ABDOMINAL HYSTERECTOMY     CESAREAN SECTION      Medications:   Current  Facility-Administered Medications:    acetaminophen (TYLENOL) tablet 500 mg, 500 mg, Oral, Q4H PRN, Cherene Altes, MD, 500 mg at 10/11/22 0902   alum & mag hydroxide-simeth (MAALOX/MYLANTA) 200-200-20 MG/5ML suspension 30 mL, 30 mL, Oral, Q4H PRN, Love, Pamela S, PA-C   bisacodyl (DULCOLAX) suppository 10 mg, 10 mg, Rectal, Daily PRN, Love, Pamela S, PA-C   clonazepam (KLONOPIN) disintegrating tablet 0.125 mg, 0.125 mg, Oral, BID PRN, Ranell Patrick, Clide Deutscher,  MD, 0.125 mg at 10/11/22 1211   diphenhydrAMINE (BENADRYL) 12.5 MG/5ML elixir 12.5-25 mg, 12.5-25 mg, Oral, Q6H PRN, Love, Pamela S, PA-C   enoxaparin (LOVENOX) injection 40 mg, 40 mg, Subcutaneous, Q24H, Love, Pamela S, PA-C, 40 mg at 10/10/22 2151   fluticasone (FLONASE) 50 MCG/ACT nasal spray 1 spray, 1 spray, Each Nare, Daily, Love, Pamela S, PA-C, 1 spray at 38/75/64 3329   folic acid-pyridoxine-cyancobalamin (FOLTX) 2.5-25-2 MG per tablet 1 tablet, 1 tablet, Oral, Daily, Love, Pamela S, PA-C, 1 tablet at 10/11/22 0901   guaiFENesin-dextromethorphan (ROBITUSSIN DM) 100-10 MG/5ML syrup 5-10 mL, 5-10 mL, Oral, Q6H PRN, Love, Pamela S, PA-C   haloperidol (HALDOL) tablet 2 mg, 2 mg, Oral, Once, Camelia Phenes, MD   loratadine (CLARITIN) tablet 10 mg, 10 mg, Oral, Daily, Love, Pamela S, PA-C, 10 mg at 10/11/22 0901   LORazepam (ATIVAN) tablet 1 mg, 1 mg, Oral, Once, Camelia Phenes, MD   losartan (COZAAR) tablet 50 mg, 50 mg, Oral, Daily, Love, Pamela S, PA-C, 50 mg at 10/11/22 0608   magnesium gluconate (MAGONATE) tablet 500 mg, 500 mg, Oral, QHS, Raulkar, Clide Deutscher, MD, 500 mg at 10/09/22 2022   melatonin tablet 3 mg, 3 mg, Oral, QHS, Raulkar, Clide Deutscher, MD, 3 mg at 10/10/22 2136   metoprolol tartrate (LOPRESSOR) injection 5 mg, 5 mg, Intravenous, Q6H PRN, Raulkar, Clide Deutscher, MD, 5 mg at 10/01/22 0129   metoprolol tartrate (LOPRESSOR) tablet 75 mg, 75 mg, Oral, BID, Love, Pamela S, PA-C, 75 mg at 10/11/22 0901   oxyCODONE (Oxy IR/ROXICODONE) immediate release tablet 5 mg, 5 mg, Oral, Q8H PRN, Raulkar, Clide Deutscher, MD, 5 mg at 10/11/22 1155   pantoprazole (PROTONIX) EC tablet 40 mg, 40 mg, Oral, Daily, Love, Pamela S, PA-C, 40 mg at 10/11/22 0901   polyethylene glycol (MIRALAX / GLYCOLAX) packet 17 g, 17 g, Oral, Daily PRN, Love, Pamela S, PA-C, 17 g at 09/26/22 0616   potassium chloride (KLOR-CON) packet 20 mEq, 20 mEq, Oral, BID, Raulkar, Clide Deutscher, MD   prochlorperazine (COMPAZINE) tablet 5-10 mg, 5-10  mg, Oral, Q6H PRN, 5 mg at 10/11/22 1155 **OR** prochlorperazine (COMPAZINE) injection 5-10 mg, 5-10 mg, Intramuscular, Q6H PRN, 10 mg at 09/30/22 0940 **OR** prochlorperazine (COMPAZINE) suppository 12.5 mg, 12.5 mg, Rectal, Q6H PRN, Love, Pamela S, PA-C   sertraline (ZOLOFT) tablet 75 mg, 75 mg, Oral, QHS, Love, Pamela S, PA-C, 75 mg at 10/10/22 2142   sodium phosphate (FLEET) 7-19 GM/118ML enema 1 enema, 1 enema, Rectal, Once PRN, Love, Pamela S, PA-C   triamcinolone 0.1 % cream : eucerin cream, 1:1, , Topical, TID, Love, Pamela S, PA-C, Given at 10/08/22 2016  Allergies: No Known Allergies     Objective  Vital signs:  Temp:  [97.8 F (36.6 C)-98.1 F (36.7 C)] 98 F (36.7 C) (01/30 0223) Pulse Rate:  [69-77] 77 (01/30 0223) Resp:  [17-18] 18 (01/30 0223) BP: (135-136)/(87-102) 135/91 (01/30 0223) SpO2:  [96 %-100 %] 100 % (01/30 0223)  Psychiatric Specialty Exam:  Presentation  General Appearance: -- (  lying in fetal position, leg twitching, flushed)  Eye Contact:Fleeting  Speech:-- (Repetitive)  Speech Volume:Decreased (whisper)  Handedness:No data recorded  Mood and Affect  Mood:-- (did not state)  Affect:Constricted   Thought Process  Thought Processes:-- (Difficult to assess, tended to repeat same 3-4 phrases)  Descriptions of Associations:-- (Difficult to asess d/t above)  Orientation:-- (Person, place, situation, could not assess date/day)  Thought Content:-- (no overt delusions/paranoia)  History of Schizophrenia/Schizoaffective disorder:No data recorded Duration of Psychotic Symptoms:No data recorded Hallucinations:Hallucinations: -- (not overlty RIS)  Ideas of Reference:None  Suicidal Thoughts:Suicidal Thoughts: No  Homicidal Thoughts:Homicidal Thoughts: No   Sensorium  Memory:-- (could not assess)  Judgment:-- (could not assess)  Insight:-- (could not assess)   Executive Functions  Concentration:Poor  Attention Span:Fair  Recall:--  (could not assess)  Fund of Knowledge:-- (could not assess)  Language:-- (could not assess)   Psychomotor Activity  Psychomotor Activity:Psychomotor Activity: -- (twitching in RLE, seemed volitional/aimed at reducing anxiety, suppressible)   Assets  Assets:Social Support   Sleep  Sleep:Sleep: -- (Increased per spouse)    Physical Exam: Physical Exam Constitutional:      Comments: Clearly tracking conversation in the room  HENT:     Head: Normocephalic.  Eyes:     Conjunctiva/sclera: Conjunctivae normal.  Musculoskeletal:     Comments: Normal ROM in b/l u/e and l/e No rigidity, cogwheeling, or clonus in 4/4 extremities  Neurological:     Mental Status: She is alert.     Comments: No grasp reflex    Review of Systems  Reason unable to perform ROS: AMS.   Blood pressure (!) 135/91, pulse 77, temperature 98 F (36.7 C), resp. rate 18, height 5\' 4"  (1.626 m), weight 87.8 kg, SpO2 100 %. Body mass index is 33.23 kg/m.

## 2022-10-11 NOTE — Progress Notes (Signed)
PROGRESS NOTE   Subjective/Complaints: No new complaints this morning Discussed anxiety during therapy yesterday and addition of prn medication and she prefers no medication  ROS: Patient denies fever, rash, sore throat, blurred vision, dizziness, nausea, vomiting, diarrhea, cough, shortness of breath or chest pain, joint or back/neck pain, headache, +panic attacks   Objective:   No results found. Recent Labs    10/10/22 0614  WBC 8.1  HGB 13.8  HCT 40.3  PLT 387     Recent Labs    10/10/22 0614  NA 136  K 4.3  CL 102  CO2 25  GLUCOSE 106*  BUN 18  CREATININE 0.95  CALCIUM 9.7      Intake/Output Summary (Last 24 hours) at 10/11/2022 1122 Last data filed at 10/10/2022 1344 Gross per 24 hour  Intake 177 ml  Output --  Net 177 ml         Physical Exam: Vital Signs Blood pressure (!) 135/91, pulse 77, temperature 98 F (36.7 C), resp. rate 18, height 5\' 4"  (1.626 m), weight 87.8 kg, SpO2 100 %.   Constitutional: No distress . Vital signs reviewed. HEENT: NCAT, EOMI, oral membranes moist Neck: supple Cardiovascular: RRR without murmur. No JVD    Respiratory/Chest: CTA Bilaterally without wheezes or rales. Normal effort, breathes heavily during therapy GI/Abdomen: BS +, non-tender, non-distended Ext: no clubbing, cyanosis, or edema Psych: pleasant and cooperative, panic attacks reported with therapy Musc:   left heel cord still tight Skin: No evidence of breakdown, no evidence of rash Neurologic: easily aroused.. Follows commands.  Right sided motor strength is grossly 5/5,    LUE 3- at deltoid , bi, tri, grip, 3- Left HF, KE, 2- ADF  Sensory exam - pt does not attend consistently       Assessment/Plan: 1. Functional deficits which require 3+ hours per day of interdisciplinary therapy in a comprehensive inpatient rehab setting. Physiatrist is providing close team supervision and 24 hour  management of active medical problems listed below. Physiatrist and rehab team continue to assess barriers to discharge/monitor patient progress toward functional and medical goals  Care Tool:  Bathing    Body parts bathed by patient: Left arm, Chest, Abdomen, Front perineal area, Buttocks, Right upper leg, Left upper leg, Face, Right arm, Right lower leg, Left lower leg   Body parts bathed by helper: Left lower leg, Right arm Body parts n/a: Right lower leg, Left lower leg   Bathing assist Assist Level: Supervision/Verbal cueing     Upper Body Dressing/Undressing Upper body dressing   What is the patient wearing?: Pull over shirt    Upper body assist Assist Level: Set up assist    Lower Body Dressing/Undressing Lower body dressing      What is the patient wearing?: Underwear/pull up, Pants     Lower body assist Assist for lower body dressing: Contact Guard/Touching assist     Toileting Toileting Toileting Activity did not occur (Clothing management and hygiene only): N/A (no void or bm)  Toileting assist Assist for toileting: Moderate Assistance - Patient 50 - 74%     Transfers Chair/bed transfer  Transfers assist     Chair/bed transfer assist level:  Supervision/Verbal cueing     Locomotion Ambulation   Ambulation assist      Assist level: Moderate Assistance - Patient 50 - 74% Assistive device: Parallel bars Max distance: 8 ft   Walk 10 feet activity   Assist  Walk 10 feet activity did not occur: Safety/medical concerns        Walk 50 feet activity   Assist Walk 50 feet with 2 turns activity did not occur: Safety/medical concerns         Walk 150 feet activity   Assist Walk 150 feet activity did not occur: Safety/medical concerns         Walk 10 feet on uneven surface  activity   Assist Walk 10 feet on uneven surfaces activity did not occur: Safety/medical concerns         Wheelchair     Assist Is the patient using  a wheelchair?: Yes Type of Wheelchair: Manual    Wheelchair assist level: Dependent - Patient 0%      Wheelchair 50 feet with 2 turns activity    Assist        Assist Level: Dependent - Patient 0%   Wheelchair 150 feet activity     Assist      Assist Level: Dependent - Patient 0%   Blood pressure (!) 135/91, pulse 77, temperature 98 F (36.7 C), resp. rate 18, height 5\' 4"  (1.626 m), weight 87.8 kg, SpO2 100 %.    Medical Problem List and Plan: 1. 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  Impaired mobility: continue Lovenox for DVT ppx. Add SCDs 3. Headache: improved. d/c topamax. Decrease oxycodone to q8H prn for severe pain, and Fioricet prn.  4. Anxiety: change Zoloft to HS, increased to 75mg . Discussed deep breathing exercises. D/c klonopin as she does not want this 5. Neuropsych/cognition: This patient is capable of making decisions on her own behalf. 6. Skin/Wound Care: Routine pressure relief measures.  7. Fluids/Electrolytes/Nutrition: encourage PO -BUN still elevated 26 but trending down from 1/12 lab (30). 8. HTN: Monitor BP TID. Was not compliant with medications--does not want meds/doesn't like meds, so was noncompliant at home with meds prescribed.  -d/c amlodipine, increase magnesium to 500mg  HS --On Norvasc, Cozaar, Hydralazine and Lopressor.             --SBP goal 130-150 range.   -ordered IV hydralazine 10mg  for SBP >160  1/28 orthostasis better yesterday it appears   -cozaar recently decreased to 50mg , norvasc changed to PM, metoprolol decreased   -bp looks good today, continue with above regimen   -encourage fluids 9. Hyperlipidemia: LDL 206- on Crestor. 10. Hyponatremia: Question  SIADH. Recheck sodium today.  11. Leucocytosis: Likely reactive--still sl elevated  -afebrile --No signs of infection.  12. Acute on chronic Headaches w/ nausea: Took 4 aleve daily.  --added Claritin and nose spray to help with congestion as could be exacerbating HA --Improved, off topamax. Added magnesium gluconate 250mg  HS 13. B12 deficiency: given IM B12 given 1/11, discussed that she may benefit from these monthly, or increasing consumption of meat.  14. Left wrist pain:  discussed that XR results are negative 15. AKI: Cr elevated, IVF started.  16. Pseudobulbar affect: Nudexta discontinued since patient started feeling nauseous after it was given  -some of PBA just pt's anxiety as well.  17. Insomnia: d/c trazodone, d/c topamax, add melatonin 3mg  HS, grounds pass ordered. 18. Hypokalemia: improved, monitor daily and supplement to goal of 4 19. Daytime somnolence: d/c klonopin and daytime topamax 20. Hyponatremia: improving, monitor daily.  21. Nausea: resolved, d/c zofran. 22. Transaminitis: likely 2/2 hydralazine and statin: d/ced. LFTs reviewed and improving, discussed with patient 53. Drug induced rash: Antibiotics d/ced. Resolved.  24. Suboptimal magnesium: improved to normal with oral supplementation 25. Hyperglycemia: advised to avoid added sugar 26. Low back pain: advised to try kpad or ice.     LOS: 21 days A FACE TO FACE EVALUATION WAS PERFORMED  Amanie Mcculley P Tekoa Hamor 10/11/2022, 11:22 AM

## 2022-10-11 NOTE — Progress Notes (Signed)
Physical Therapy Session Note  Patient Details  Name: Kelly Stephenson MRN: [id number] Date of Birth: May 11, 1971  Today's Date: 10/11/2022 PT Individual Time: 1003-1100 PT Individual Time Calculation (min): 57 min   Short Term Goals: Week 1:  PT Short Term Goal 1 (Week 1): Pt will complete bed mobility with minA PT Short Term Goal 1 - Progress (Week 1): Met PT Short Term Goal 2 (Week 1): Pt will complete bed<>chair transfers with minA PT Short Term Goal 2 - Progress (Week 1): Met PT Short Term Goal 3 (Week 1): Pt will ambulate 75ft with minA and LRAD PT Short Term Goal 3 - Progress (Week 1): Partly met  Skilled Therapeutic Interventions/Progress Updates:      Therapy Documentation Precautions:  Precautions Precautions: Fall Precaution Comments: L hemi Restrictions Weight Bearing Restrictions: No  Pt received semi-reclined in bed agreeable to PT session with emphasis on dynamic gait and stability training.   Pt presents with increased anxiety and utilized pursed lip breathing in session. PT guided pt through mental imagery strategy as source of positive distraction with emphasis on five senses.   Pt requires CGA with STS and gait to sink and requires set-up for oral care. Pt transported total A for time management in w/c to quiet and less stimulating hallway for dynamic gait training and PT dependently donned ace wrap for DF assist to left foot.   Pt requires CGA with gait with RW forward x 50 feet and backward x 50 ft. Pt requires verbal cues for step length and pt emotional throughout session and PT uitlized therapeutic use of self to provide comfort.   Pt transported total A for time management to dayroom and performed lateral side stepping with additional assist for UE support while PT safely guarded patient posteriorly. Pt requires verbal cues for step length and to increase left foot clearance and is able to appropriately correct.   Pt transitioned to standing marches with  RW and CGA 1 x 6 and presented with increase left LE tone. Pt transported to room by w/c.   Pt left seated in w/c at bedside with all needs in reach and alarm on.   Therapy/Group: Individual Therapy  Verl Dicker Verl Dicker PT, DPT  10/11/2022, 7:56 AM

## 2022-10-12 ENCOUNTER — Other Ambulatory Visit (HOSPITAL_COMMUNITY): Payer: Self-pay

## 2022-10-12 DIAGNOSIS — F411 Generalized anxiety disorder: Secondary | ICD-10-CM | POA: Diagnosis not present

## 2022-10-12 DIAGNOSIS — I610 Nontraumatic intracerebral hemorrhage in hemisphere, subcortical: Secondary | ICD-10-CM | POA: Diagnosis not present

## 2022-10-12 DIAGNOSIS — F4325 Adjustment disorder with mixed disturbance of emotions and conduct: Secondary | ICD-10-CM

## 2022-10-12 DIAGNOSIS — I619 Nontraumatic intracerebral hemorrhage, unspecified: Secondary | ICD-10-CM | POA: Diagnosis not present

## 2022-10-12 MED ORDER — SERTRALINE HCL 50 MG PO TABS
75.0000 mg | ORAL_TABLET | Freq: Every day | ORAL | 0 refills | Status: DC
  Filled 2022-10-12: qty 45, 30d supply, fill #0

## 2022-10-12 MED ORDER — FOLTANX 3-35-2 MG PO TABS
1.0000 | ORAL_TABLET | Freq: Every day | ORAL | 0 refills | Status: DC
  Filled 2022-10-12 (×2): qty 30, 30d supply, fill #0

## 2022-10-12 MED ORDER — FLUTICASONE PROPIONATE 50 MCG/ACT NA SUSP
1.0000 | Freq: Every day | NASAL | 0 refills | Status: AC
  Filled 2022-10-12: qty 16, 30d supply, fill #0

## 2022-10-12 MED ORDER — MELATONIN 3 MG PO TABS
3.0000 mg | ORAL_TABLET | Freq: Every day | ORAL | 0 refills | Status: DC
  Filled 2022-10-12: qty 30, 30d supply, fill #0

## 2022-10-12 MED ORDER — HALOPERIDOL 1 MG PO TABS
2.0000 mg | ORAL_TABLET | ORAL | 0 refills | Status: DC | PRN
  Filled 2022-10-12: qty 5, 5d supply, fill #0

## 2022-10-12 MED ORDER — LOSARTAN POTASSIUM 25 MG PO TABS
25.0000 mg | ORAL_TABLET | Freq: Every day | ORAL | 0 refills | Status: DC
  Filled 2022-10-12: qty 30, 30d supply, fill #0

## 2022-10-12 MED ORDER — LOSARTAN POTASSIUM 50 MG PO TABS
25.0000 mg | ORAL_TABLET | Freq: Every day | ORAL | Status: DC
  Administered 2022-10-13: 25 mg via ORAL
  Filled 2022-10-12: qty 1

## 2022-10-12 MED ORDER — OXYCODONE HCL 5 MG PO TABS
5.0000 mg | ORAL_TABLET | Freq: Three times a day (TID) | ORAL | 0 refills | Status: DC | PRN
  Filled 2022-10-12: qty 15, 5d supply, fill #0

## 2022-10-12 MED ORDER — METOPROLOL TARTRATE 50 MG PO TABS
75.0000 mg | ORAL_TABLET | Freq: Two times a day (BID) | ORAL | 0 refills | Status: DC
  Filled 2022-10-12: qty 90, 30d supply, fill #0

## 2022-10-12 MED ORDER — PANTOPRAZOLE SODIUM 40 MG PO TBEC
40.0000 mg | DELAYED_RELEASE_TABLET | Freq: Every day | ORAL | 0 refills | Status: DC
  Filled 2022-10-12: qty 30, 30d supply, fill #0

## 2022-10-12 MED ORDER — MAGNESIUM OXIDE 400 MG PO TABS
400.0000 mg | ORAL_TABLET | Freq: Every day | ORAL | 0 refills | Status: DC
  Filled 2022-10-12: qty 30, 30d supply, fill #0

## 2022-10-12 MED ORDER — LORAZEPAM 1 MG PO TABS
1.0000 mg | ORAL_TABLET | ORAL | 0 refills | Status: DC | PRN
  Filled 2022-10-12: qty 5, 5d supply, fill #0

## 2022-10-12 NOTE — Progress Notes (Signed)
Patient ID: Kelly Stephenson, female   DOB: [birth year], 52 y.o.   MRN: [id number] Met with husband while in the hallway and updated him regarding team conference and readiness for discharge tomorrow. He states: " She is really looking forward to going home tomorrow."  Met with pt who is feeling ready to go home and feels it will fall in place once there. Husband to get rolling walker on own asked again today about this. Aware home health set up and will contact once home to set up appointments. Pt's Mom is here and providing support. Work toward discharge for tomorrow.

## 2022-10-12 NOTE — Progress Notes (Signed)
Physical Therapy Discharge Summary  Patient Details  Name: Kelly Stephenson MRN: [id number] Date of Birth: [birth year]  Date of Discharge from PT service:October 12, 2022  Patient has met 7 of 8 long term goals due to improved activity tolerance, improved balance, improved postural control, increased strength, ability to compensate for deficits, functional use of  left upper extremity and left lower extremity, improved attention, improved awareness, and improved coordination.  Patient to discharge at an ambulatory level  CGA .   Patient's care partner is independent to provide the necessary physical and cognitive assistance at discharge.  Reasons goals not met: Gait goal set to 150' - due to patient's high anxiety levels and fear of falling, able to ambulate up to 100'. Transport w/c recommended and provided.  Recommendation:  Patient will benefit from ongoing skilled PT services in home health setting to continue to advance safe functional mobility, address ongoing impairments in L sided weakness, caregiver training, home safety, gait training, and minimize fall risk.  Equipment: Transport wheelchair, AFO, toe capped shoe. Family purchasing RW on their own.  Reasons for discharge: treatment goals met  Patient/family agrees with progress made and goals achieved: Yes  PT Discharge Precautions/Restrictions Precautions Precautions: Fall Precaution Comments: L hemi Required Braces or Orthoses: Other Brace Other Brace: L AFO (SpryStep, PLS), toe capped shoe Restrictions Weight Bearing Restrictions: No Pain Pain Assessment Pain Scale: 0-10 Pain Score: 0-No pain Pain Interference Pain Interference Pain Effect on Sleep: 2. Occasionally Pain Interference with Therapy Activities: 2. Occasionally Pain Interference with Day-to-Day Activities: 2. Occasionally Vision/Perception  Vision - History Ability to See in Adequate Light: 0 Adequate Perception Perception:  Impaired Inattention/Neglect: Does not attend to left side of body (mild) Praxis Praxis: Intact  Cognition Overall Cognitive Status: Impaired/Different from baseline Arousal/Alertness: Lethargic Orientation Level: Oriented X4 Attention: Focused;Sustained;Selective Focused Attention: Appears intact Sustained Attention: Impaired Sustained Attention Impairment: Verbal complex;Functional complex Selective Attention: Impaired Selective Attention Impairment: Verbal complex;Functional complex Memory: Impaired Memory Impairment: Retrieval deficit;Decreased recall of new information;Decreased short term memory;Decreased long term memory Decreased Long Term Memory: Verbal basic;Functional basic Decreased Short Term Memory: Functional complex;Verbal complex Awareness: Impaired Awareness Impairment: Emergent impairment;Anticipatory impairment Problem Solving: Impaired Problem Solving Impairment: Verbal complex;Functional complex Executive Function: Organizing;Initiating;Self Monitoring;Self Correcting Behaviors: Poor frustration tolerance;Other (comment) (Anxious) Safety/Judgment: Appears intact Sensation Sensation Light Touch: Impaired by gross assessment Hot/Cold: Appears Intact Proprioception: Impaired by gross assessment Stereognosis: Not tested Coordination Gross Motor Movements are Fluid and Coordinated: No Coordination and Movement Description: L hemi Motor  Motor Motor: Hemiplegia;Motor apraxia;Abnormal postural alignment and control;Motor impersistence  Mobility Bed Mobility Bed Mobility: Supine to Sit;Sit to Supine Supine to Sit: Supervision/Verbal cueing Sit to Supine: Supervision/Verbal cueing Transfers Transfers: Sit to Stand;Stand to Sit;Squat Pivot Transfers;Stand Pivot Transfers Sit to Stand: Contact Guard/Touching assist Stand to Sit: Contact Guard/Touching assist Stand Pivot Transfers: Contact Guard/Touching assist Stand Pivot Transfer Details: Verbal cues for  precautions/safety;Verbal cues for gait pattern;Visual cues/gestures for sequencing;Tactile cues for initiation;Tactile cues for posture;Tactile cues for placement Transfer (Assistive device): 1 person hand held assist Locomotion  Gait Ambulation: Yes Gait Assistance: Contact Guard/Touching assist Gait Distance (Feet): 100 Feet Assistive device: Rolling walker Gait Assistance Details: Verbal cues for safe use of DME/AE;Verbal cues for gait pattern;Verbal cues for precautions/safety;Verbal cues for technique;Verbal cues for sequencing;Tactile cues for initiation;Tactile cues for posture;Tactile cues for weight shifting;Tactile cues for placement Gait Gait: Yes Gait Pattern: Impaired Gait Pattern: Step-through pattern;Decreased step length - left;Decreased stance time - left;Decreased hip/knee flexion - left;Decreased  dorsiflexion - left;Decreased weight shift to left;Left genu recurvatum;Trunk flexed;Poor foot clearance - left Stairs / Additional Locomotion Stairs: Yes Stairs Assistance: Contact Guard/Touching assist Stair Management Technique: Two rails;Step to pattern;Forwards Number of Stairs: 4 Height of Stairs: 6 Ramp: Contact Guard/touching assist Wheelchair Mobility Wheelchair Mobility: No  Trunk/Postural Assessment  Cervical Assessment Cervical Assessment: Within Functional Limits Thoracic Assessment Thoracic Assessment: Exceptions to [facility] (thoracic flexion) Lumbar Assessment Lumbar Assessment: Within Functional Limits Postural Control Postural Control: Deficits on evaluation  Balance Balance Balance Assessed: Yes Static Sitting Balance Static Sitting - Balance Support: Feet supported Static Sitting - Level of Assistance: 7: Independent Dynamic Sitting Balance Dynamic Sitting - Balance Support: Feet supported Dynamic Sitting - Level of Assistance: 7: Independent Static Standing Balance Static Standing - Balance Support: Bilateral upper extremity supported Static  Standing - Level of Assistance: 6: Modified independent (Device/Increase time) Dynamic Standing Balance Dynamic Standing - Balance Support: Bilateral upper extremity supported;During functional activity Dynamic Standing - Level of Assistance: 5: Stand by assistance Extremity Assessment      RLE Assessment RLE Assessment: Within Functional Limits LLE Assessment LLE Assessment: Exceptions to [facility] LLE Strength LLE Overall Strength: Deficits Left Hip Flexion: 2+/5 Left Hip Extension: 2/5 Left Hip ABduction: 2+/5 Left Hip ADduction: 2+/5 Left Knee Flexion: 2+/5 Left Knee Extension: 3+/5 Left Ankle Dorsiflexion: 2-/5   Evon Lopezperez P Tasfia Vasseur PT, DPT 10/12/2022, 1:57 PM

## 2022-10-12 NOTE — Progress Notes (Signed)
Occupational Therapy Discharge Summary  Patient Details  Name: Kelly Stephenson MRN: [id number] Date of Birth: April 15, 1971  Date of Discharge from OT service:October 12, 2022  Today's Date: 10/12/2022 OT Individual Time: 1415-1455 OT Individual Time Calculation (min): 40 min    Patient has met 12 of 12 long term goals due to improved activity tolerance, improved balance, postural control, ability to compensate for deficits, functional use of  LEFT upper and LEFT lower extremity, improved attention, improved awareness, and improved coordination.  Patient to discharge at [facility] Assist level.  Patient's care partner is independent to provide the necessary physical and cognitive assistance at discharge.    Reasons goals not met: NA  Recommendation:  Patient will benefit from ongoing skilled OT services in outpatient setting to continue to advance functional skills in the area of BADL, iADL, Vocation, and Reduce care partner burden.  Equipment: Tub transfer bench  Reasons for discharge: treatment goals met and discharge from hospital  Patient/family agrees with progress made and goals achieved: Yes Skilled Intervention:   Patient received supine in bed.  Mom and husband present for family education.  Transported patient from room to apartment to practice actual tub shower transfer with tub transfer bench.  Also recommended patient obtain suction cup grab bar.  Husband works at [employer] and reports they are sold at [employer]   Long discussion regarding which shower to use.  Had husband do hands on transfer with patient from wheelchair to bed.  Family may be planning for mom to stay x 1 week post discharge.  Patient left in bed with bed alarm set, and all needs addressed.   OT Discharge Precautions/Restrictions  Precautions Precautions: Fall Precaution Comments: L hemi Required Braces or Orthoses: Other Brace Other Brace: L AFO (SpryStep, PLS), toe capped  shoe Restrictions Weight Bearing Restrictions: No  Pain Pain Assessment Pain Scale: 0-10 Pain Score: 0-No pain ADL ADL Eating: Independent Grooming: Setup Where Assessed-Grooming: Sitting at sink Upper Body Bathing: Modified independent Where Assessed-Upper Body Bathing: Shower Lower Body Bathing: Supervision/safety Where Assessed-Lower Body Bathing: Shower Upper Body Dressing: Setup Where Assessed-Upper Body Dressing: Chair Lower Body Dressing: Setup Where Assessed-Lower Body Dressing: Chair Toileting: Supervision/safety Where Assessed-Toileting: Glass blower/designer: Close supervision Toilet Transfer Method: Engineer, water: Energy manager: Close supervison Clinical cytogeneticist Method: Engineer, production: Facilities manager: Close supervision Social research officer, government Method: Radiographer, therapeutic: Gaffer Baseline Vision/History: 1 Wears glasses Patient Visual Report: Eye fatigue/eye pain/headache Vision Assessment?: No apparent visual deficits Eye Alignment: Within Functional Limits Ocular Range of Motion: Within Functional Limits Alignment/Gaze Preference: Within Defined Limits Tracking/Visual Pursuits: Able to track stimulus in all quads without difficulty Saccades: Additional eye shifts occurred during testing Convergence: Impaired (comment) Visual Fields: No apparent deficits Perception  Perception: Impaired Inattention/Neglect: Does not attend to left side of body Praxis Praxis: Intact Praxis Impairment Details: Initiation Cognition Cognition Overall Cognitive Status: Impaired/Different from baseline Arousal/Alertness: Lethargic Orientation Level: Person;Place;Situation Person: Oriented Place: Oriented Situation: Oriented Memory: Impaired Memory Impairment: Retrieval deficit;Decreased short term memory;Decreased recall of new  information Decreased Long Term Memory: Functional basic Decreased Short Term Memory: Functional basic Attention: Selective Focused Attention: Appears intact Focused Attention Impairment: Functional basic Sustained Attention: Appears intact Sustained Attention Impairment: Functional basic Selective Attention: Impaired Selective Attention Impairment: Functional basic;Functional complex Awareness: Impaired Awareness Impairment: Anticipatory impairment Problem Solving: Impaired Problem Solving Impairment: Functional complex;Functional basic Executive Function: Sequencing;Organizing;Decision Making;Initiating;Self Monitoring;Self  Correcting Sequencing: Appears intact Organizing: Impaired Organizing Impairment: Functional basic Decision Making: Impaired Decision Making Impairment: Functional basic Initiating: Impaired Initiating Impairment: Functional basic Self Monitoring: Impaired Self Monitoring Impairment: Functional basic;Functional complex Self Correcting: Impaired Self Correcting Impairment: Functional complex Behaviors: Poor frustration tolerance;Other (comment);Lability (pseudobulbar and now panic attacks) Safety/Judgment: Impaired Comments: heightened fear response Brief Interview for Mental Status (BIMS) Repetition of Three Words (First Attempt): 3 Temporal Orientation: Year: Correct Temporal Orientation: Month: Accurate within 5 days Temporal Orientation: Day: Correct Recall: "Sock": Yes, no cue required Recall: "Blue": Yes, no cue required Recall: "Bed": Yes, no cue required BIMS Summary Score: 15 Sensation Sensation Light Touch: Impaired by gross assessment Hot/Cold: Appears Intact Proprioception: Impaired by gross assessment Stereognosis: Not tested Additional Comments: diminished sensation left UE Coordination Gross Motor Movements are Fluid and Coordinated: No Fine Motor Movements are Fluid and Coordinated: No Coordination and Movement Description: L  hemiplegia Motor  Motor Motor: Hemiplegia;Abnormal postural alignment and control;Motor impersistence Mobility  Bed Mobility Bed Mobility: Supine to Sit;Sit to Supine Supine to Sit: Independent with assistive device (increased time) Sit to Supine: Independent with assistive device Transfers Sit to Stand: Supervision/Verbal cueing Stand to Sit: Supervision/Verbal cueing  Trunk/Postural Assessment  Cervical Assessment Cervical Assessment: Within Functional Limits Thoracic Assessment Thoracic Assessment: Exceptions to [facility] (thoracic flexion) Lumbar Assessment Lumbar Assessment: Exceptions to [facility] (posterior pelvis) Postural Control Postural Control: Deficits on evaluation Trunk Control: delayed Righting Reactions: insufficient  Balance Balance Balance Assessed: Yes Static Sitting Balance Static Sitting - Balance Support: Feet supported Static Sitting - Level of Assistance: 7: Independent Dynamic Sitting Balance Dynamic Sitting - Balance Support: Feet supported Dynamic Sitting - Level of Assistance: 7: Independent Dynamic Sitting - Balance Activities: Lateral lean/weight shifting;Forward lean/weight shifting Static Standing Balance Static Standing - Balance Support: Bilateral upper extremity supported;During functional activity Static Standing - Level of Assistance: 6: Modified independent (Device/Increase time) Dynamic Standing Balance Dynamic Standing - Balance Support: Bilateral upper extremity supported;During functional activity Dynamic Standing - Level of Assistance: 5: Stand by assistance Dynamic Standing - Balance Activities: Lateral lean/weight shifting;Forward lean/weight shifting Extremity/Trunk Assessment RUE Assessment RUE Assessment: Within Functional Limits LUE Assessment LUE Assessment: Exceptions to [facility] Active Range of Motion (AROM) Comments: displays 90* shoulder flexion during functional activity - eg washing hair, difficulty sustaining position due to  proximal weakness General Strength Comments: proximal weakness, motor impersistence throughout LUE LUE Body System: Neuro Brunstrum levels for arm and hand: Arm;Hand Brunstrum level for arm: Stage IV Movement is deviating from synergy Brunstrum level for hand: Stage IV Movements deviating from synergies   [name] 10/12/2022, 3:22 PM

## 2022-10-12 NOTE — Patient Care Conference (Signed)
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 Leeroy Cha Social Worker Present: Ovidio Kin, LCSW Nurse Present: Dorien Chihuahua, RN PT Present: Ginnie Smart, PT OT Present: Other (comment) Antony Salmon, OT) SLP Present: Helaine Chess, SLP PPS Coordinator present : Gunnar Fusi, SLP     Current Status/Progress Goal Weekly Team Focus  Bowel/Bladder   Pt is continent of b/b. LBM: 1/30   Remain continent of b/b.   Assist w/ toileting needs q 2-4 hours & as needed.    Swallow/Nutrition/ Hydration               ADL's   Min/supervision BADL   Supervision/min assist BADL   Family education, calming practices    Mobility   supervision bed mobility, CGA squat<>pivot transfers, CGA gait using RW ~177ft. CGA stairs. Unable to complete AFO consult 1/30 due to panic attacks. Rescheduled for 1/31. At goal level   CGA overall  DC planning, family education    Communication                Safety/Cognition/  Behavioral Observations  Sup A  - pt limited by anxiey   Supervision   GOAL LEVEL    Pain   Pt denies pain.   Remain free of pain.   Assess pain q shift & PRN.    Skin   Redness to L elbow, L great toe & R ankle - pt been refusing ointment. Skin intact.   Maintain skin integrity.  Assess skin q shift & PRN.      Discharge Planning:  Husband will be here caregiver taking a leave from work, have asked he dod hands on education while here since here all of the time but usually sleeping when entering room. Both prefer home health arranged. Equipment ordered   Team Discussion: Patient limited by uncontrolled anxiety. BP is controlled.  Patient on target to meet rehab goals: yes, currently at goal level for ADLs. Needs supervision for bed mobility, CGA for transfers. Able to ambulate up to 100' with CGA and manage steps with CGA. Needs supervision for cognition.   *See Care Plan and progress notes for long and short-term goals.   Revisions to Treatment Plan:  AFO consult; brace ordered  Behavior plan updated  Teaching Needs: Safety, medications, transfers, toileting, etc.   Current Barriers to Discharge: Decreased caregiver support and Behavior  Possible Resolutions to Barriers: Family education DME: RW, transport chair OP follow up services     Medical Summary Current Status: anxiety, hypertension, hypokalemia, panic attacks,  foot drop, insomnia  Barriers to Discharge: Medical stability;Behavior/Mood  Barriers to Discharge Comments: anxiety, hypertension, hypokalemia, panic attacks, foot drop, insomnia Possible Resolutions to [employer] Focus: pysch consulted, haldol ordered prn, continue cozaar and lopressor, porassium supplement ordered, AFO ordered, increase melatonin   Continued Need for Acute Rehabilitation Level of Care: The patient requires daily medical management by a physician with specialized training in physical medicine and rehabilitation for the  following reasons: Direction of a multidisciplinary physical rehabilitation program to maximize functional independence : Yes Medical management of patient stability for increased activity during participation in an intensive rehabilitation regime.: Yes Analysis of laboratory values and/or radiology reports with any subsequent need for medication adjustment and/or medical intervention. : Yes   I attest that I was present, lead the team conference, and concur with the assessment and plan of the team.   Dorien Chihuahua B 10/12/2022, 3:02 PM

## 2022-10-12 NOTE — Progress Notes (Signed)
Occupational Therapy Session Note  Patient Details  Name: Kelly Stephenson MRN: [id number] Date of Birth: 1970-10-29  Today's Date: 10/12/2022 OT Individual Time: 1884-1660 OT Individual Time Calculation (min): 60 min    Short Term Goals: Week 3:  OT Short Term Goal 1 (Week 3): Patient wilL utilize LUE to aide with hygiene and self feeding with only initial cueing OT Short Term Goal 2 (Week 3): Patient will tie shoes with min assist using bimanual technique OT Short Term Goal 3 (Week 3): Patient will complete toilet/ shower transfer with contact guard assist OT Short Term Goal 4 (Week 3): Patient will complete toileting with contact guard and cueing OT Short Term Goal 5 (Week 3): Patient will don upper body pull over shirt independently  Skilled Therapeutic Interventions/Progress Updates:   Patient received supine in bed - woke easily and requesting shower.  Declined walking to bathroom due to exhaustion.  Patient states she had a bad day yesterday - noted severe panic attack requiring medication.  Patient's husband sleeping in recliner.  Patient's chair set next to bed and patient bale to sit up from supine and transfer to wheelchair then shower seat with supervision/ cueing for brakes, etc.  Patient showered with distant supervision.  Transported to sink to complete hygiene.  Patient reported headache and asking to lie down.  Vitals stable.  BP 106/67 Pulse 87 O2 Sats 98%.  Patient allowed supine rest break x 2 min then encouraged to get back up and complete dressing.  Patient agreeable.  Patient's husband now awake and informed of level of assistance required.  Husband asking about tile shower - explained that patient would have a tub transfer bench and will review today.  Patient indicating uncertainty about who will be helping her at home.   Patient left in bed with alarm engaged and personal items/ call bell in reach.    Therapy Documentation Precautions:  Precautions Precautions:  Fall Precaution Comments: L hemi Restrictions Weight Bearing Restrictions: No  Pain:  Denies pain     Therapy/Group: Individual Therapy  [name] 10/12/2022, 12:43 PM

## 2022-10-12 NOTE — Progress Notes (Signed)
PROGRESS NOTE   Subjective/Complaints: Appears calm this morning, she is doing deep breathing. Working with Dr. Lovette Cliche from psychiatry Denies headaches  ROS: Patient denies fever, rash, sore throat, blurred vision, dizziness, nausea, vomiting, diarrhea, cough, shortness of breath or chest pain, joint or back/neck pain, headache, +panic attacks   Objective:   No results found. Recent Labs    10/10/22 0614  WBC 8.1  HGB 13.8  HCT 40.3  PLT 387     Recent Labs    10/10/22 0614  NA 136  K 4.3  CL 102  CO2 25  GLUCOSE 106*  BUN 18  CREATININE 0.95  CALCIUM 9.7      Intake/Output Summary (Last 24 hours) at 10/12/2022 1116 Last data filed at 10/12/2022 0944 Gross per 24 hour  Intake 118 ml  Output --  Net 118 ml         Physical Exam: Vital Signs Blood pressure 128/83, pulse 79, temperature 98.1 F (36.7 C), resp. rate 18, height 5\' 4"  (1.626 m), weight 87.8 kg, SpO2 99 %.   Constitutional: No distress . Vital signs reviewed. HEENT: NCAT, EOMI, oral membranes moist Neck: supple Cardiovascular: RRR without murmur. No JVD    Respiratory/Chest: CTA Bilaterally without wheezes or rales. Normal effort, breathes heavily during therapy GI/Abdomen: BS +, non-tender, non-distended Ext: no clubbing, cyanosis, or edema Psych: pleasant and cooperative, panic attacks reported with therapy, fair attention Musc:   left heel cord still tight Skin: No evidence of breakdown, no evidence of rash Neurologic: easily aroused.. Follows commands.  Right sided motor strength is grossly 5/5,    LUE 3- at deltoid , bi, tri, grip, 3- Left HF, KE, 2- ADF  Sensory exam - pt does not attend consistently       Assessment/Plan: 1. Functional deficits which require 3+ hours per day of interdisciplinary therapy in a comprehensive inpatient rehab setting. Physiatrist is providing close team supervision and 24 hour management  of active medical problems listed below. Physiatrist and rehab team continue to assess barriers to discharge/monitor patient progress toward functional and medical goals  Care Tool:  Bathing    Body parts bathed by patient: Left arm, Chest, Abdomen, Front perineal area, Buttocks, Right upper leg, Left upper leg, Face, Right arm, Right lower leg, Left lower leg   Body parts bathed by helper: Left lower leg, Right arm Body parts n/a: Right lower leg, Left lower leg   Bathing assist Assist Level: Supervision/Verbal cueing     Upper Body Dressing/Undressing Upper body dressing   What is the patient wearing?: Pull over shirt    Upper body assist Assist Level: Set up assist    Lower Body Dressing/Undressing Lower body dressing      What is the patient wearing?: Underwear/pull up, Pants     Lower body assist Assist for lower body dressing: Contact Guard/Touching assist     Toileting Toileting Toileting Activity did not occur (Clothing management and hygiene only): N/A (no void or bm)  Toileting assist Assist for toileting: Moderate Assistance - Patient 50 - 74%     Transfers Chair/bed transfer  Transfers assist     Chair/bed transfer assist level: Supervision/Verbal cueing  Locomotion Ambulation   Ambulation assist      Assist level: Moderate Assistance - Patient 50 - 74% Assistive device: Parallel bars Max distance: 8 ft   Walk 10 feet activity   Assist  Walk 10 feet activity did not occur: Safety/medical concerns        Walk 50 feet activity   Assist Walk 50 feet with 2 turns activity did not occur: Safety/medical concerns         Walk 150 feet activity   Assist Walk 150 feet activity did not occur: Safety/medical concerns         Walk 10 feet on uneven surface  activity   Assist Walk 10 feet on uneven surfaces activity did not occur: Safety/medical concerns         Wheelchair     Assist Is the patient using a  wheelchair?: Yes Type of Wheelchair: Manual    Wheelchair assist level: Dependent - Patient 0%      Wheelchair 50 feet with 2 turns activity    Assist        Assist Level: Dependent - Patient 0%   Wheelchair 150 feet activity     Assist      Assist Level: Dependent - Patient 0%   Blood pressure 128/83, pulse 79, temperature 98.1 F (36.7 C), resp. rate 18, height 5\' 4"  (1.626 m), weight 87.8 kg, SpO2 99 %.    Medical Problem List and Plan: 1. 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  Impaired mobility: continue Lovenox for DVT ppx. Add SCDs 3. Headache: improved. d/c topamax. Decrease oxycodone to q8H prn for severe pain, and Fioricet prn.  4. Anxiety: change Zoloft to HS, increased to 75mg . Discussed deep breathing exercises. D/c klonopin as she does not want this. Haldol ordered prn for home.  5. Neuropsych/cognition: This patient is capable of making decisions on her own behalf. 6. Skin/Wound Care: Routine pressure relief measures.  7. Fluids/Electrolytes/Nutrition: encourage PO -BUN still elevated 26 but trending down from 1/12 lab (30). 8. HTN: Monitor BP TID. Was not compliant with medications--does not want meds/doesn't like meds, so was noncompliant at home with meds prescribed.  -d/c amlodipine, increase magnesium to 500mg  HS -decrease Cozaar to 25mg . D/c Hydralazine, continue  Lopressor BID             --SBP goal 130-150 range.   -ordered IV hydralazine 10mg  for SBP >160 9. Hyperlipidemia: LDL 206- on Crestor. 10. Hyponatremia: Question SIADH. Recheck sodium today.  11. Leucocytosis: Likely reactive--still sl elevated  -afebrile --No signs of infection.  12. Acute on chronic Headaches w/  nausea: Took 4 aleve daily.  --added Claritin and nose spray to help with congestion as could be exacerbating HA --Improved, off topamax. Added magnesium gluconate 250mg  HS 13. B12 deficiency: given IM B12 given 1/11, discussed that she may benefit from these monthly, or increasing consumption of meat.  14. Left wrist pain: discussed that XR results are negative 15. AKI: Cr elevated, IVF started.  16. Pseudobulbar affect: Nudexta discontinued since patient started feeling nauseous after it was given  -some of PBA  just pt's anxiety as well.  17. Insomnia: d/c trazodone, d/c topamax, add melatonin 3mg  HS, grounds pass ordered. 18. Hypokalemia: improved, monitor daily and supplement to goal of 4 19. Daytime somnolence: d/c klonopin and daytime topamax 20. Hyponatremia: improving, monitor daily.  21. Nausea: resolved, d/c zofran. 22. Transaminitis: likely 2/2 hydralazine and statin: d/ced. LFTs reviewed and improving, discussed with patient 33. Drug induced rash: Antibiotics d/ced. Resolved.  24. Suboptimal magnesium: improved to normal with oral supplementation 25. Hyperglycemia: advised to avoid added sugar 26. Low back pain: advised to try kpad or ice.     LOS: 22 days A FACE TO FACE EVALUATION WAS PERFORMED  Martha Clan P Jacque Byron 10/12/2022, 11:16 AM

## 2022-10-12 NOTE — Progress Notes (Addendum)
Inpatient Rehabilitation Discharge Medication Review by a Pharmacist  A complete drug regimen review was completed for this patient to identify any potential clinically significant medication issues.  High Risk Drug Classes Is patient taking? Indication by Medication  Antipsychotic Yes Haloperidol prn agitation  Anticoagulant No   Antibiotic No   Opioid Yes Oxycodone - prn pain  Antiplatelet No   Hypoglycemics/insulin No   Vasoactive Medication Yes Losartan, metoprolol - BP  Chemotherapy No   Other Yes Rosuvastatin - HLD Sertraline - anxiety lorazepam - prn anxiety Pantoprazole - Reflux     Type of Medication Issue Identified Description of Issue Recommendation(s)  Drug Interaction(s) (clinically significant)     Duplicate Therapy     Allergy     No Medication Administration End Date     Incorrect Dose     Additional Drug Therapy Needed     Significant med changes from prior encounter (inform family/care partners about these prior to discharge).    Other       Clinically significant medication issues were identified that warrant physician communication and completion of prescribed/recommended actions by midnight of the next day:  No  Pharmacist comments: None  Time spent performing this drug regimen review (minutes): 30 minutes  Anette Guarneri, PharmD

## 2022-10-12 NOTE — Progress Notes (Signed)
Physical Therapy Session Note  Patient Details  Name: Kelly Stephenson MRN: [id number] Date of Birth: 01/13/1971  Today's Date: 10/12/2022 PT Individual Time: 1300-1345 PT Individual Time Calculation (min): 45 min  and Today's Date: 10/12/2022 PT Missed Time: 15 Minutes Missed Time Reason: Patient ill (Comment) (anxiety)  Short Term Goals: Week 3:  PT Short Term Goal 1 (Week 3): STG = LTG due to ELOS  Skilled Therapeutic Interventions/Progress Updates:      Pt presents supine in bed with family (husband and mother) at bedside. Pt calm and in agreement to therapy session - denies pain. Focused session on family education and training to prepare for tomorrow DC. Discussed home safety, fall prevention, DME rec's, f/u therapies, primary deficits related to CVA, stroke recovery, etc. CPO from [facility] present for part of session to complete AFO consult - SpryStep AFO (PLS) recommended with toe capped shoe.   Gait training during session ~121ft distances with CGA and RW - primary gait deficits include toe drag on L, genu recurvatum on L, downward gaze, decreased speed, and flexed trunk. Cues throughout for correcting and normalizing. Gait distances limited by anxiety and fatigue. Educated family on guarding/positioning and cues to provide patient for safe ambulation.   Stair training using 6inch steps and 2 hand rails - navigated x4 steps with CGA with step-to pattern for both ascent and descent. Patient with safe technique and able to recall sequencing without cues. Educated family on guarding/positioning and cues to provide patient for safe stair negotiation.   Car transfer training with car height simulating their sedan - completed with CGA and RW - patient able to manage LE in/out of vehicle without assist.  Reviewed wheelchair features/functions and importance of locking brakes when sitting, preparing to stand/sit for safety reasons. Reviewed wear schedule for AFO.   Husband and wife  present for active observation throughout session. All questions/concerns addressed. Patient becoming anxious towards end of session when discussing DC planning and home management, asking to return to her room to lie down and rest. To prevent panic attack - allowed this and assisted her back to bed. RN notified of patient's increasing anxiety, possible need for Rx. Pt missed 15 minutes of therapy session due to anxiety.    Therapy Documentation Precautions:  Precautions Precautions: Fall Precaution Comments: L hemi Restrictions Weight Bearing Restrictions: No General:    Therapy/Group: Individual Therapy  Carrigan Delafuente P Michaela Shankel PT 10/12/2022, 7:34 AM

## 2022-10-12 NOTE — Progress Notes (Signed)
Speech Language Pathology Discharge Summary  Patient Details  Name: Kelly Stephenson MRN: [id number] Date of Birth: 1971-06-03  Date of Discharge from SLP service:October 12, 2022  Today's Date: 10/12/2022 SLP Individual Time: 9735-3299 SLP Individual Time Calculation (min): 42 min   Skilled Therapeutic Interventions:   Pt seen this date for skilled ST intervention targeting cognitive goals outlined in care plan. Pt received awake, though appearing fatigued + slightly anxious; lying supine in bed. NT present assisting pt with toileting needs. Husband and mother present. Agreeable to intervention. Utilized pursed lip breathing intermittently. Participatory with therapeutic rest breaks and encouragement. Excited to d/c tomorrow.  SLP facilitated today's session by providing pt and family education re: neuro recovery, home exercise program, self-advocacy re: need for rest breaks when anxiety is rising, as well as brainstorming activities that are self-soothing that could assist with calming pt when she becomes anxious; pt reports limited activities that calm her despite SLP providing list of activities (e.g. listening to calming music, coloring, reading, etc.). Attempted to engage pt in discussion re: interest in pursuing outpatient counseling; however, pt's breathing began to increase and discussion was terminated. Given rise in pt's anxiety level with further education, SLP engaged pt in card game (e.g. UNO) to engage cognitive skills without conversation. Pt successfully engaged in game for ~10 minutes before needing a break due to increased anxiety. Pt recalled and adhered to game rules with Mod I throughout. Recalled recent events with Mod I. All questions answered.  Pt left in room and in bed with all safety measures activated and call bell within reach. Spouse and mother at bedside. Continue per current ST POC.   Patient has met 5 of 5 long term goals.  Patient to discharge at overall  Supervision level.  Reasons goals not met: N/A   Clinical Impression/Discharge Summary:    Pt has made steady progress and has met all cognitive goals at the supervision level. Remains limited by anxiety. Pt and family education has been completed re: ST POC, home exercise program, and ways to encourage neuro recovery at home. Discussed importance of having family supervise when pt is completing iADL tasks such as medication and financial management; they verbalized understanding. Recommend f/u ST intervention at next venue of care ([place] vs OP). All parties voiced agreement and understanding.   Care Partner:  Caregiver Able to Provide Assistance: Yes  Type of Caregiver Assistance: Physical;Cognitive  Recommendation:  Home Health SLP;Outpatient SLP;24 hour supervision/assistance  Rationale for SLP Follow Up: Maximize cognitive function and independence   Equipment: N/A   Reasons for discharge: Discharged from hospital   Patient/Family Agrees with Progress Made and Goals Achieved: Yes    Jesiah Grismer A Jizel Cheeks 10/12/2022, 1:07 PM

## 2022-10-12 NOTE — Consult Note (Signed)
[facility] New Face-to-Face Psychiatric Evaluation   Service Date: October 12, 2022 LOS:  LOS: 22 days    Assessment  Kelly Stephenson is a 52 y.o. female admitted to rehab for 09/20/2022  2:35 PM after suffering an intracerebral hemorrhage. She carries no formal psychiatric diagnoses (chronic insomnia/anxiety per family) and has a past medical history of  hypertension, thalamic hemorrhage, and hemiplegia. Psychiatry was consulted for anxiety/psychosis by Reesa Chew.    Her current presentation of abrupt onset of a "breakdown" during speech therapy after being asked to complete a SLUMS is most consistent with a panic attack; on my exam she was curled in fetal position, shaking, and engaged minimally in interview; these symptoms worsened when asking about current mood. Had received small dose klonopin before being seen.  Other differential items considered include an excited delirium (pt did poorly on CAM-ICU, attention, although this is partially explained by panic/stroke), a conversion disorder (based on magnitude and duration of sx and clear trigger), a seizure disorder (recent hemorrhagic stroke, acute onset of AMS/repetitive movements), and an excited catatonia (husband described what sounded like posturing a week ago, however no rigidty, grasp, etc on exam today, also not c/w acute onset). Did have an episode of depersonalization/derealization earlier this hospitalization per family; nothing like that has ever happened before. Due to lack of engagement in interview, could not assess for most other psychiatric conditions today or make meaningful/thoughtful changes to current daily medication regimen. She has no significant psych history, although this was mostly obtained from husband and mother, and took no psychotropic medications prior to presentation. To combat acute symptoms we will be trying 2 mg haldol + 1 mg of ativan; discussed r/b/se of these medications with husband (who  provided informed consent) and pt (who nodded her assent).   1/31: Saw pt, husband, and mom. Pt much more participative in exam, discussed preliminary diagnosis as above. Discussed meds and return precautions. Discussed sending pt home with 5-10 pills of haldol/ativan combo.   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 This decision is based on my review of the chart including patient's history and current presentation, interview of the patient, mental status examination, and consideration of suicide risk including evaluating suicidal ideation, plan, intent, suicidal or self-harm behaviors, risk factors, and protective factors. This judgment is based on our ability to directly address suicide risk, implement suicide prevention strategies and develop a safety plan while the patient is in the clinical setting. Please contact our team if there is a concern that risk level has changed.   ## Medications:  -- pt on 75 mg sertraline per primary  -- good response to one time order of haldol 2/ativan 1 (would have been OK with klonopin 0.5)   - trying to minimize BZD given persistent confusion, high risk delirium.   Other centrally acting meds include klonopin 0.125 BID PRN, melatonin 3 QHS   ##  Medical Decision Making Capacity:  Functionally lacked at time of assessment; this will fluctuate; per notes generally makes own decisions.   ##  Further Work-up:  -- none currently; consider EEG if AMS does not resolve by tomorrow    -- most recent EKG on 1/19 had QtC of 432 -- Pertinent labwork reviewed earlier this admission includes: TSH WNL, B12 low 1/10, vitamin D low 1/11 d/w primary  ## Disposition:  -- per primary  ## Behavioral / Environmental:  -- Pt responds well to medical team summarizing information rather than re-asking questions    Thank you for this consult request. Recommendations have been communicated to the primary team.  We will sign off in anticipation of discharge tomorrow at this time.   Jibril Mcminn A Illyria Sobocinski   New history  Relevant Aspects of Hospital Course:  Admitted on 09/20/2022 for an ICH and later transferred to CIR. Early CIR course complicated by what sounds like (per husband) a week of decreased energy, depersonalization, amotivation following a panic attack. Has generally had difficulty confronting current deficits per read of therapy notes. Multiple episodes of confusion noted through CIR course.  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   Patient Report:  Saw pt in room, lying in bed with husband and mother present. She is oriented to self and to general situation, and remembers most of the interview from yesterday. It sounds like she returned to baseline within about 3 hours of getting the haldol/ativan. Today she was very engaged in interview, only deferring to husband 1-2x for things she didn't remember from earlier in the hospital course. She is receptive to psychological understanding of yesterday's events, and is hopeful this will improve after she goes home. Had no persistent mood issues or anxiety issues leading into stroke; per pt insomnia was worst symptom prior to this. Laughed at appropriate points in interview. She is agreeable to taking sertraline on a daily basis and haldol/ativan (r/b/se discussed) as an emergency medication (used preventative/abortive language as  pt with hx migraines). Had long conversation about reasons to come back to the hospital (from psych perspective) and discussed need for followup.   ROS:  Mostly + insomnia, anxiety, fear  Collateral information:  Husband filled in some of the timeline   Psychiatric History:  Information collected from pt, family Chronic anxiety, insomnia No formalt x, therapy, hospitalization Type A personality   Does have periods where she is irritable when insomnia worsens, husband did not endorse any other sx of mania. Chronically high goal directed behavior unrelated to sleep cycling.   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   Tobacco use: deferred Alcohol use: deferred Drug use: deferred  Family History:  Mother stated no psych history in family, sister w/ sepression per EMR The patient's family history includes Depression in her sister; Diabetes in her father and sister; Glaucoma in her maternal uncle; Heart Problems in her father; Heart attack in her father; High blood pressure in her brother, father, mother, and sister; Hypertension in her father, maternal aunt, maternal uncle, and sister; Kidney disease in her maternal uncle; Obesity in her maternal aunt; Osteoporosis in her mother; Stroke in her father, maternal grandfather, paternal uncle, and another family member.  Medical History: Past Medical History:  Diagnosis Date   Anxiety    Chronic right shoulder pain    Gastrocnemius strain, left    did outpatient Tx w/ improvement   Hypertension  Insomnia     Surgical History: Past Surgical History:  Procedure Laterality Date   ABDOMINAL HYSTERECTOMY     CESAREAN SECTION      Medications:   Current Facility-Administered Medications:    acetaminophen (TYLENOL) tablet 500 mg, 500 mg, Oral, Q4H PRN, Cherene Altes, MD, 500 mg at 10/11/22 0902   alum & mag hydroxide-simeth (MAALOX/MYLANTA) 200-200-20 MG/5ML suspension 30 mL, 30 mL,  Oral, Q4H PRN, Love, Pamela S, PA-C   bisacodyl (DULCOLAX) suppository 10 mg, 10 mg, Rectal, Daily PRN, Love, Pamela S, PA-C   clonazepam (KLONOPIN) disintegrating tablet 0.125 mg, 0.125 mg, Oral, BID PRN, Ranell Patrick, Clide Deutscher, MD, 0.125 mg at 10/11/22 1211   diphenhydrAMINE (BENADRYL) 12.5 MG/5ML elixir 12.5-25 mg, 12.5-25 mg, Oral, Q6H PRN, Love, Pamela S, PA-C   enoxaparin (LOVENOX) injection 40 mg, 40 mg, Subcutaneous, Q24H, Love, Pamela S, PA-C, 40 mg at 10/11/22 2154   fluticasone (FLONASE) 50 MCG/ACT nasal spray 1 spray, 1 spray, Each Nare, Daily, Love, Pamela S, PA-C, 1 spray at 88/41/66 0630   folic acid-pyridoxine-cyancobalamin (FOLTX) 2.5-25-2 MG per tablet 1 tablet, 1 tablet, Oral, Daily, Love, Pamela S, PA-C, 1 tablet at 10/12/22 0954   guaiFENesin-dextromethorphan (ROBITUSSIN DM) 100-10 MG/5ML syrup 5-10 mL, 5-10 mL, Oral, Q6H PRN, Love, Pamela S, PA-C   loratadine (CLARITIN) tablet 10 mg, 10 mg, Oral, Daily, Love, Pamela S, PA-C, 10 mg at 10/12/22 0954   losartan (COZAAR) tablet 50 mg, 50 mg, Oral, Daily, Love, Pamela S, PA-C, 50 mg at 10/12/22 0606   magnesium gluconate (MAGONATE) tablet 500 mg, 500 mg, Oral, QHS, Raulkar, Clide Deutscher, MD, 500 mg at 10/11/22 2148   melatonin tablet 3 mg, 3 mg, Oral, QHS, Raulkar, Clide Deutscher, MD, 3 mg at 10/11/22 2148   metoprolol tartrate (LOPRESSOR) injection 5 mg, 5 mg, Intravenous, Q6H PRN, Raulkar, Clide Deutscher, MD, 5 mg at 10/01/22 0129   metoprolol tartrate (LOPRESSOR) tablet 75 mg, 75 mg, Oral, BID, Love, Pamela S, PA-C, 75 mg at 10/12/22 1601   oxyCODONE (Oxy IR/ROXICODONE) immediate release tablet 5 mg, 5 mg, Oral, Q8H PRN, Raulkar, Clide Deutscher, MD, 5 mg at 10/12/22 0953   pantoprazole (PROTONIX) EC tablet 40 mg, 40 mg, Oral, Daily, Love, Pamela S, PA-C, 40 mg at 10/12/22 0954   polyethylene glycol (MIRALAX / GLYCOLAX) packet 17 g, 17 g, Oral, Daily PRN, Love, Pamela S, PA-C, 17 g at 09/26/22 0616   potassium chloride (KLOR-CON) packet 20 mEq, 20 mEq,  Oral, BID, Raulkar, Clide Deutscher, MD, 20 mEq at 10/12/22 0954   prochlorperazine (COMPAZINE) tablet 5-10 mg, 5-10 mg, Oral, Q6H PRN, 5 mg at 10/11/22 1155 **OR** prochlorperazine (COMPAZINE) injection 5-10 mg, 5-10 mg, Intramuscular, Q6H PRN, 10 mg at 09/30/22 0940 **OR** prochlorperazine (COMPAZINE) suppository 12.5 mg, 12.5 mg, Rectal, Q6H PRN, Love, Pamela S, PA-C   sertraline (ZOLOFT) tablet 75 mg, 75 mg, Oral, QHS, Love, Pamela S, PA-C, 75 mg at 10/11/22 2148   sodium phosphate (FLEET) 7-19 GM/118ML enema 1 enema, 1 enema, Rectal, Once PRN, Love, Pamela S, PA-C   triamcinolone 0.1 % cream : eucerin cream, 1:1, , Topical, TID, Love, Pamela S, PA-C, Given at 10/08/22 2016  Allergies: No Known Allergies     Objective  Vital signs:  Temp:  [98 F (36.7 C)-98.5 F (36.9 C)] 98.1 F (36.7 C) (01/31 0531) Pulse Rate:  [79-95] 79 (01/31 0531) Resp:  [18-20] 18 (01/31 0531) BP: (128-153)/(75-83) 128/83 (01/31 0531) SpO2:  [97 %-100 %] 99 % (01/31 0531)  Psychiatric Specialty Exam:  Presentation  General Appearance: -- (lying in fetal position, leg twitching, flushed)  Eye Contact:Fleeting  Speech:-- (Repetitive)  Speech Volume:Decreased (whisper)  Handedness:No data recorded  Mood and Affect  Mood:-- (did not state)  Affect:Constricted   Thought Process  Thought Processes:-- (Difficult to assess, tended to repeat same 3-4 phrases)  Descriptions of Associations:-- (Difficult to asess d/t above)  Orientation:-- (Person, place, situation, could not assess date/day)  Thought Content:-- (no overt delusions/paranoia)  History of Schizophrenia/Schizoaffective disorder:No data recorded Duration of Psychotic Symptoms:No data recorded Hallucinations:Hallucinations: -- (not overlty RIS)  Ideas of Reference:None  Suicidal Thoughts:Suicidal Thoughts: No  Homicidal Thoughts:Homicidal Thoughts: No   Sensorium  Memory:-- (could not assess)  Judgment:-- (could not  assess)  Insight:-- (could not assess)   Executive Functions  Concentration:Poor  Attention Span:Fair  Recall:-- (could not assess)  Fund of Knowledge:-- (could not assess)  Language:-- (could not assess)   Psychomotor Activity  Psychomotor Activity:Psychomotor Activity: -- (twitching in RLE, seemed volitional/aimed at reducing anxiety, suppressible)   Assets  Assets:Social Support   Sleep  Sleep:Sleep: -- (Increased per spouse)    Physical Exam: Physical Exam HENT:     Head: Normocephalic.  Eyes:     Conjunctiva/sclera: Conjunctivae normal.  Neurological:     Mental Status: She is alert and oriented to person, place, and time.  Psychiatric:        Behavior: Behavior normal.    Blood pressure 128/83, pulse 79, temperature 98.1 F (36.7 C), resp. rate 18, height 5\' 4"  (1.626 m), weight 87.8 kg, SpO2 99 %. Body mass index is 33.23 kg/m.

## 2022-10-12 NOTE — Plan of Care (Signed)
  Problem: RH Problem Solving Goal: LTG Patient will demonstrate problem solving for (SLP) Description: LTG:  Patient will demonstrate problem solving for basic/complex daily situations with cues  (SLP) Outcome: Completed/Met   Problem: RH Memory Goal: LTG Patient will demonstrate ability for day to day (SLP) Description: LTG:   Patient will demonstrate ability for day to day recall/carryover during cognitive/linguistic activities with assist  (SLP) Outcome: Completed/Met Goal: LTG Patient will use memory compensatory aids to (SLP) Description: LTG:  Patient will use memory compensatory aids to recall biographical/new, daily complex information with cues (SLP) Outcome: Completed/Met   Problem: RH Attention Goal: LTG Patient will demonstrate this level of attention during functional activites (SLP) Description: LTG:  Patient will will demonstrate this level of attention during functional activites (SLP) Outcome: Completed/Met   Problem: RH Awareness Goal: LTG: Patient will demonstrate awareness during functional activites type of (SLP) Description: LTG: Patient will demonstrate awareness during functional activites type of (SLP) Outcome: Completed/Met   

## 2022-10-12 NOTE — Progress Notes (Signed)
Inpatient Rehabilitation Care Coordinator Discharge Note   Patient Details  Name: Kelly Stephenson MRN: [id number] Date of Birth: 1971/07/15   Discharge location: HOME WITH HUSBAND AND 52 YO SON-24/7 CARE  Length of Stay: 12 DAYS  Discharge activity level: [place]  Home/community participation: ACTIVE  Patient response MW:UXLKGM Literacy - How often do you need to have someone help you when you read instructions, pamphlets, or other written material from your doctor or pharmacy?: Never  Patient response WN:UUVOZD Isolation - How often do you feel lonely or isolated from those around you?: Never  Services provided included: MD, RD, PT, OT, SLP, RN, CM, TR, Pharmacy, SW  Financial Services:  Charity fundraiser Utilized: Herbalist  Choices offered to/list presented to: PT AND HUSBAND  Follow-up services arranged:  Home Health, DME, Patient/Family has no preference for HH/DME agencies Home Health Agency: [facility]    DME : [facility]    Patient response to transportation need: Is the patient able to respond to transportation needs?: Yes In the past 12 months, has lack of transportation kept you from medical appointments or from getting medications?: No In the past 12 months, has lack of transportation kept you from meetings, work, or from getting things needed for daily living?: No    Comments (or additional information): PT AND HUSBAND LOOKING FORWARD TO GOING HOME. HUSBAND TO BE HER CAREGIVER AND WILL RELY UPON 17 SON SOME ALSO. HUSBAND AWARE OF HER NEED FOR 24/7 CARE AT HOME.  Patient/Family verbalized understanding of follow-up arrangements:  Yes  Individual responsible for coordination of the follow-up plan: GEORGE-HUSBAND [phone]  Confirmed correct DME delivered: Elease Hashimoto 10/12/2022    Elease Hashimoto

## 2022-10-13 ENCOUNTER — Encounter (HOSPITAL_COMMUNITY): Payer: Self-pay | Admitting: Physical Medicine and Rehabilitation

## 2022-10-13 DIAGNOSIS — F064 Anxiety disorder due to known physiological condition: Secondary | ICD-10-CM | POA: Insufficient documentation

## 2022-10-13 LAB — BASIC METABOLIC PANEL
Anion gap: 9 (ref 5–15)
BUN: 31 mg/dL — ABNORMAL HIGH (ref 6–20)
CO2: 24 mmol/L (ref 22–32)
Calcium: 9.3 mg/dL (ref 8.9–10.3)
Chloride: 104 mmol/L (ref 98–111)
Creatinine, Ser: 1.09 mg/dL — ABNORMAL HIGH (ref 0.44–1.00)
GFR, Estimated: >60 mL/min (ref >60)
Glucose, Bld: 101 mg/dL — ABNORMAL HIGH (ref 70–99)
Potassium: 4.2 mmol/L (ref 3.5–5.1)
Sodium: 137 mmol/L (ref 135–145)

## 2022-10-13 NOTE — Progress Notes (Signed)
PROGRESS NOTE   Subjective/Complaints: Sleepy this AM Discussed with Pam stopping potassium supplement, encouraging 6-8 glasses of water daily for AKI  ROS: Patient denies fever, rash, sore throat, blurred vision, dizziness, nausea, vomiting, diarrhea, cough, shortness of breath or chest pain, joint or back/neck pain, headache, +panic attacks   Objective:   No results found. No results for input(s): "WBC", "HGB", "HCT", "PLT" in the last 72 hours.    Recent Labs    10/13/22 0614  NA 137  K 4.2  CL 104  CO2 24  GLUCOSE 101*  BUN 31*  CREATININE 1.09*  CALCIUM 9.3      Intake/Output Summary (Last 24 hours) at 10/13/2022 1023 Last data filed at 10/13/2022 0930 Gross per 24 hour  Intake 554 ml  Output --  Net 554 ml         Physical Exam: Vital Signs Blood pressure (!) 140/79, pulse 98, temperature 97.8 F (36.6 C), temperature source Oral, resp. rate 18, height 5\' 4"  (1.626 m), weight 87.8 kg, SpO2 99 %.   Gen: no distress, normal appearing HEENT: oral mucosa pink and moist, NCAT Cardio: Reg rate Chest: normal effort, normal rate of breathing Abd: soft, non-distended  Ext: no clubbing, cyanosis, or edema Psych: pleasant and cooperative, panic attacks reported with therapy, fair attention Musc:   left heel cord still tight Skin: No evidence of breakdown, no evidence of rash Neurologic: easily aroused.. Follows commands.  Right sided motor strength is grossly 5/5,    LUE 3- at deltoid , bi, tri, grip, 3- Left HF, KE, 2- ADF  Sensory exam - pt does not attend consistently       Assessment/Plan: 1. Functional deficits which require 3+ hours per day of interdisciplinary therapy in a comprehensive inpatient rehab setting. Physiatrist is providing close team supervision and 24 hour management of active medical problems listed below. Physiatrist and rehab team continue to assess barriers to  discharge/monitor patient progress toward functional and medical goals  Care Tool:  Bathing    Body parts bathed by patient: Left arm, Chest, Abdomen, Front perineal area, Buttocks, Right upper leg, Left upper leg, Face, Right arm, Right lower leg, Left lower leg   Body parts bathed by helper: Left lower leg, Right arm Body parts n/a: Right lower leg, Left lower leg   Bathing assist Assist Level: Set up assist     Upper Body Dressing/Undressing Upper body dressing   What is the patient wearing?: Pull over shirt    Upper body assist Assist Level: Independent    Lower Body Dressing/Undressing Lower body dressing      What is the patient wearing?: Underwear/pull up, Pants     Lower body assist Assist for lower body dressing: Supervision/Verbal cueing     Toileting Toileting Toileting Activity did not occur (Clothing management and hygiene only): N/A (no void or bm)  Toileting assist Assist for toileting: Supervision/Verbal cueing     Transfers Chair/bed transfer  Transfers assist     Chair/bed transfer assist level: Contact Guard/Touching assist     Locomotion Ambulation   Ambulation assist      Assist level: Contact Guard/Touching assist Assistive device: Walker-rolling Max distance:  100'   Walk 10 feet activity   Assist  Walk 10 feet activity did not occur: Safety/medical concerns  Assist level: Contact Guard/Touching assist Assistive device: Walker-rolling   Walk 50 feet activity   Assist Walk 50 feet with 2 turns activity did not occur: Safety/medical concerns  Assist level: Contact Guard/Touching assist Assistive device: Walker-rolling    Walk 150 feet activity   Assist Walk 150 feet activity did not occur: Safety/medical concerns (anxiety)         Walk 10 feet on uneven surface  activity   Assist Walk 10 feet on uneven surfaces activity did not occur: Safety/medical concerns   Assist level: Contact Guard/Touching  assist Assistive device: Walker-rolling   Wheelchair     Assist Is the patient using a wheelchair?: No Type of Wheelchair: Manual    Wheelchair assist level: Dependent - Patient 0%      Wheelchair 50 feet with 2 turns activity    Assist        Assist Level: Dependent - Patient 0%   Wheelchair 150 feet activity     Assist      Assist Level: Dependent - Patient 0%   Blood pressure (!) 140/79, pulse 98, temperature 97.8 F (36.6 C), temperature source Oral, resp. rate 18, height 5\' 4"  (1.626 m), weight 87.8 kg, SpO2 99 %.    Medical Problem List and Plan: 1. Functional deficits secondary to L hemiparesis from R Basal ganglia ICH             -patient may  shower             -ELOS/Goals: 18-21 days min A- PT, OT and SLP  D/c home today  Head CT reviewed and shows decrease in size of bleed  Increase to regular frequency 2.  Impaired mobility: continue Lovenox for DVT ppx. Add SCDs 3. Headache: improved. d/c topamax. Decrease oxycodone to q8H prn for severe pain, and Fioricet prn.  4. Anxiety: change Zoloft to HS, increased to 75mg . Discussed deep breathing exercises. D/c klonopin as she does not want this. Haldol ordered prn for home.  5. Neuropsych/cognition: This patient is capable of making decisions on her own behalf. 6. Skin/Wound Care: Routine pressure relief measures.  7. Fluids/Electrolytes/Nutrition: encourage PO -BUN still elevated 26 but trending down from 1/12 lab (30). 8. HTN: Monitor BP TID. Was not compliant with medications--does not want meds/doesn't like meds, so was noncompliant at home with meds prescribed.  -d/c amlodipine, increase magnesium to 500mg  HS -decrease Cozaar to 25mg . D/c Hydralazine, continue  Lopressor BID             --SBP goal 130-150 range.   -ordered IV hydralazine 10mg  for SBP >160 9. Hyperlipidemia: LDL 206- on Crestor. 10. Hyponatremia: Question SIADH. Recheck sodium today.  11. Leucocytosis: Likely reactive--still sl  elevated  -afebrile --No signs of infection.  12. Acute on chronic Headaches w/ nausea: Took 4 aleve daily.  --added Claritin and nose spray to help with congestion as could be exacerbating HA --Improved, off topamax. Added magnesium gluconate 250mg  HS 13. B12 deficiency: given IM B12 given 1/11, discussed that she may benefit from these monthly, or increasing consumption of meat.  14. Left wrist pain: discussed that XR results are negative 15. AKI: Cr elevated, IVF started.  16. Pseudobulbar affect: Nudexta discontinued since patient started feeling nauseous after it was given  -some of PBA just pt's anxiety as well.  17. Insomnia: d/c trazodone, d/c topamax, add melatonin 3mg  HS,  grounds pass ordered. 18. Hypokalemia: improved, monitor outaptient, and supplement to goal of 4 19. Daytime somnolence: d/c klonopin and daytime topamax 20. Hyponatremia: improving, monitor daily.  21. Nausea: resolved, d/c zofran. 22. Transaminitis: likely 2/2 hydralazine and statin: d/ced. LFTs reviewed and improving, discussed with patient 65. Drug induced rash: Antibiotics d/ced. Resolved.  24. Suboptimal magnesium: improved to normal with oral supplementation, continue supplement at home.  25. Hyperglycemia: advised to avoid added sugar. Discussed that this can be helpful with healing.  26. Low back pain: advised to try kpad or ice. Continue tylenol prn.     >30 minutes spent in discharge of patient including review of medications and follow-up appointments, physical examination, and in answering all patient's questions  LOS: 23 days A FACE TO FACE EVALUATION WAS [place] 10/13/2022, 10:23 AM

## 2022-10-13 NOTE — Discharge Summary (Signed)
Physician Discharge Summary  Patient ID: Kelly Stephenson MRN: [id number] DOB/AGE: [birth year] 52 y.o.  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 EXAM: ULTRASOUND ABDOMEN LIMITED RIGHT UPPER QUADRANT COMPARISON:  CT dated 09/30/2022. FINDINGS: Gallbladder: No gallstones or wall thickening visualized. No sonographic Murphy sign noted by sonographer. Common bile duct: Diameter: 2 mm Liver: The liver is unremarkable. Portal vein is patent on color Doppler imaging with normal direction of blood flow towards the liver. Other: None. IMPRESSION: Unremarkable right upper quadrant ultrasound. Electronically Signed   By: Anner Crete M.D.   On: 09/30/2022 20:27   DG Chest 2 View  Result Date: 09/30/2022 CLINICAL DATA:  Fever EXAM: CHEST - 2 VIEW COMPARISON:  None Available. FINDINGS: Unchanged cardiomediastinal silhouette. There is no focal airspace consolidation. There is no pleural effusion or evidence of pneumothorax. There is no acute osseous abnormality. Globular calcifications overlies the right acromiohumeral interval IMPRESSION: No evidence of acute cardiopulmonary disease. Calcific tendinosis of the distal right rotator cuff. Electronically Signed   By: Maurine Simmering M.D.   On: 09/30/2022 16:09   CT ABDOMEN PELVIS WO CONTRAST  Result Date: 09/30/2022 CLINICAL DATA:  Acute abdominal pain EXAM: CT ABDOMEN AND PELVIS WITHOUT CONTRAST  TECHNIQUE: Multidetector CT imaging of the abdomen and pelvis was performed following the standard protocol without IV contrast. RADIATION DOSE REDUCTION: This exam was performed according to the departmental dose-optimization program which includes automated exposure control, adjustment of the mA and/or kV according to patient size and/or use of iterative reconstruction technique. COMPARISON:  None Available. FINDINGS: Lower chest: Solid pulmonary nodule of the left lower lobe measuring 5 mm on series 5, image 7 Hepatobiliary: No focal liver abnormality. Gallbladder is distended with possible mild wall thickening, lack of IV contrast limits evaluation. No biliary ductal dilation. Pancreas: Unremarkable. No pancreatic ductal dilatation or surrounding inflammatory changes. Spleen: Normal in size without focal abnormality. Adrenals/Urinary Tract: Bilateral adrenal glands are unremarkable. No hydronephrosis or nephrolithiasis. Bladder is unremarkable. Stomach/Bowel: Stomach is within normal limits. Appendix appears normal. No evidence of bowel wall thickening, distention, or inflammatory changes. Vascular/Lymphatic: No significant vascular findings are present. No enlarged abdominal or pelvic lymph nodes. Reproductive: No adnexal masses. Other: No abdominal wall hernia or abnormality. No abdominopelvic ascites. Musculoskeletal: No acute or significant osseous findings. IMPRESSION: 1. Gallbladder is distended with possible mild wall thickening, lack of IV contrast limits evaluation. Correlate for symptoms of right upper quadrant pain and consider gallbladder ultrasound for further evaluation. 2. Solid pulmonary nodule of the left lower lobe measuring 5 mm. No follow-up needed if patient is low-risk.This recommendation follows the consensus statement: Guidelines for Management of Incidental Pulmonary Nodules Detected on CT Images: From the Fleischner Society 2017; Radiology 2017; 284:228-243. Electronically Signed   By:  Yetta Glassman M.D.   On: 09/30/2022 15:49   CT HEAD WO CONTRAST (5MM)  Result Date: 09/30/2022 CLINICAL DATA:  Hemorrhagic stroke follow-up. EXAM: CT HEAD WITHOUT CONTRAST TECHNIQUE: Contiguous axial images were obtained from the base of  the skull through the vertex without intravenous contrast. RADIATION DOSE REDUCTION: This exam was performed according to the departmental dose-optimization program which includes automated exposure control, adjustment of the mA and/or kV according to patient size and/or use of iterative reconstruction technique. COMPARISON:  CT head dated September 28, 2022. FINDINGS: Brain: Right thalamic hemorrhage has slightly decreased in size and density since the prior study. Surrounding edema is similar. No new infarct, hydrocephalus, extra-axial collection or mass lesion. No midline shift. Vascular: No hyperdense vessel or unexpected calcification. Skull: Normal. Negative for fracture or focal lesion. Sinuses/Orbits: No acute finding. Other: None. IMPRESSION: 1. Slightly decreased size and density of the right thalamic hemorrhage. No new acute finding. Electronically Signed   By: Obie Dredge M.D.   On: 09/30/2022 15:42   DG Abd 1 View  Result Date: 09/30/2022 CLINICAL DATA:  Nausea vomiting EXAM: ABDOMEN - 1 VIEW COMPARISON:  09/29/2022 FINDINGS: The bowel gas pattern is normal. No radio-opaque calculi or other significant radiographic abnormality are seen. IMPRESSION: Negative. Electronically Signed   By: Layla Maw M.D.   On: 09/30/2022 12:59   DG Abd 1 View  Result Date: 09/29/2022 CLINICAL DATA:  [id number] Emesis [id number] EXAM: ABDOMEN - 1 VIEW COMPARISON:  None Available. FINDINGS: Gaseous distension of colon. No dilated loops of small bowel are visualized. Air is visualized in the rectum. Pelvic phleboliths. IMPRESSION: Nonobstructive bowel gas pattern. If persistent concern for obstructive physiology, recommend dedicated CT abdomen pelvis with contrast.  Electronically Signed   By: Meda Klinefelter M.D.   On: 09/29/2022 16:51   CT HEAD WO CONTRAST ( )  Result Date: 09/29/2022 CLINICAL DATA:  Worsening headache EXAM: CT HEAD WITHOUT CONTRAST TECHNIQUE: Contiguous axial images were obtained from the base of the skull through the vertex without intravenous contrast. RADIATION DOSE REDUCTION: This exam was performed according to the departmental dose-optimization program which includes automated exposure control, adjustment of the mA and/or kV according to patient size and/or use of iterative reconstruction technique. 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 Likely unchanged surrounding edema and mild regional mass effect. No acute infarct, new hemorrhage, mass, or midline shift. No hydrocephalus or extra-axial collection. Vascular: No hyperdense vessel. Skull: Negative for fracture or focal lesion. Sinuses/Orbits: Clear paranasal sinuses. No acute finding in the orbits. Other: The mastoids are well aerated. IMPRESSION: 1. 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 2. No acute intracranial process. Electronically Signed   By: Wiliam Ke M.D.   On: 09/29/2022 01:43   CT HEAD WO CONTRAST ( )  Result Date: 09/24/2022 CLINICAL DATA:  52 year old female who presented with right basal ganglia hemorrhage on January 5th. Subsequent encounter. EXAM: CT HEAD WITHOUT CONTRAST TECHNIQUE: Contiguous axial images were obtained from the base of the skull through the vertex without intravenous contrast. RADIATION DOSE REDUCTION: This exam was performed according to the departmental dose-optimization program which includes automated exposure control, adjustment of the mA and/or kV according to patient size and/or use of  iterative reconstruction technique. COMPARISON:  09/19/2022 and earlier. FINDINGS: Brain: Hyperdense right lateral thalamic hemorrhage is stable and size and has begun to fade. More indistinct appearance of blood products now. Continued surrounding edema. Stable mild regional mass effect. Intraventricular blood has largely resolved. No ventriculomegaly. Basilar cisterns remain patent. Stable gray-white differentiation elsewhere. No new No acute intracranial  abnormality. Vascular: Calcified atherosclerosis at the skull base. No suspicious intracranial vascular hyperdensity. Skull: No acute osseous abnormality identified. Sinuses/Orbits: Visualized paranasal sinuses and mastoids are stable and well aerated. Other: Visualized orbits and scalp soft tissues are within normal limits. IMPRESSION: 1. Resolved intraventricular hemorrhage and fading of the right lateral thalamic blood products since 09/19/2022. Stable mild regional edema and mass effect. 2. No new intracranial abnormality. Electronically Signed   By: Genevie Ann M.D.   On: 09/24/2022 10:21   DG Wrist 2 Views Left  Result Date: 09/23/2022 CLINICAL DATA:  Left-sided wrist pain EXAM: LEFT WRIST - 2 VIEW COMPARISON:  None Available. FINDINGS: There is no evidence of fracture or dislocation. There is no evidence of arthropathy or other focal bone abnormality. Soft tissues are unremarkable. If there is persistent pain or further concern for scaphoid injury recommend follow-up imaging in 7-10 days as these injuries can be acutely x-ray occult. IMPRESSION: No acute osseous abnormality Electronically Signed   By: Jill Side M.D.   On: 09/23/2022 17:44   CT HEAD WO CONTRAST (5MM)  Result Date: 09/19/2022 CLINICAL DATA:  Intracranial hemorrhage EXAM: CT HEAD WITHOUT CONTRAST TECHNIQUE: Contiguous axial images were obtained from the base of the skull through the vertex without intravenous contrast. RADIATION DOSE REDUCTION: This exam was performed according to the  departmental dose-optimization program which includes automated exposure control, adjustment of the mA and/or kV according to patient size and/or use of iterative reconstruction technique. COMPARISON:  CT head 09/16/2022 FINDINGS: Brain: The 1.6 cm AP x 1.0 cm TV x 1.9 cm cc right thalamic hematoma is unchanged in size. Intraventricular extension of the body of the right lateral ventricle is similar to the prior study; however, blood in the third and fourth ventricles is decreased. There is mild surrounding edema with partial effacement of the adjacent right lateral ventricle but no midline shift. The ventricular system is stable in size and configuration without evidence of developing hydrocephalus. There is no new acute intracranial hemorrhage or extra-axial fluid collection. There is no acute territorial infarct. There is no mass lesion. Vascular: No hyperdense vessel or unexpected calcification. Skull: Normal. Negative for fracture or focal lesion. Sinuses/Orbits: Stable. IMPRESSION: 1. Stable size of the right thalamic hematoma with mild surrounding edema but no midline shift, and decreased intraventricular blood 2. No new acute intracranial pathology. Electronically Signed   By: Valetta Mole M.D.   On: 09/19/2022 20:34   ECHOCARDIOGRAM COMPLETE  Result Date: 09/17/2022    ECHOCARDIOGRAM REPORT   Patient Name:   NYKOLE MATOS Date of Exam: 09/17/2022 Medical Rec #:  [id number]        Height:       64.0 in Accession #:    [id number]       Weight:       203.7 lb Date of Birth:  June 19, 1971        BSA:          1.972 m Patient Age:    77 years         BP:           125/79 mmHg Patient Gender: F                HR:           86 bpm. Exam Location:  Inpatient Procedure: 2D Echo, Cardiac Doppler and Color Doppler Indications:    Stroke  History:        Patient has no prior history of Echocardiogram  examinations.                 Risk Factors:Hypertension.  Sonographer:    Clayton Lefort RDCS (AE) Referring Phys: Altamease Oiler DE LA TORRE  Sonographer Comments: Suboptimal subcostal window. IMPRESSIONS  1. Left ventricular ejection fraction, by estimation, is 60 to 65%. The left ventricle has normal function. The left ventricle has no regional wall motion abnormalities. There is mild left ventricular hypertrophy. Left ventricular diastolic parameters are consistent with Grade I diastolic dysfunction (impaired relaxation).  2. Right ventricular systolic function is normal. The right ventricular size is mildly enlarged.  3. The mitral valve is abnormal. Trivial mitral valve regurgitation. No evidence of mitral stenosis.  4. The aortic valve is tricuspid. Aortic valve regurgitation is not visualized. No aortic stenosis is present. Comparison(s): No prior Echocardiogram. FINDINGS  Left Ventricle: Left ventricular ejection fraction, by estimation, is 60 to 65%. The left ventricle has normal function. The left ventricle has no regional wall motion abnormalities. The left ventricular internal cavity size was normal in size. There is  mild left ventricular hypertrophy. Left ventricular diastolic parameters are consistent with Grade I diastolic dysfunction (impaired relaxation). Right Ventricle: The right ventricular size is mildly enlarged. No increase in right ventricular wall thickness. Right ventricular systolic function is normal. Left Atrium: Left atrial size was normal in size. Right Atrium: Right atrial size was normal in size. Pericardium: There is no evidence of pericardial effusion. Mitral Valve: The mitral valve is abnormal. Mild to moderate mitral annular calcification. Trivial mitral valve regurgitation. No evidence of mitral valve stenosis. Tricuspid Valve: The tricuspid valve is not well visualized. Tricuspid valve regurgitation is not demonstrated. No evidence of tricuspid stenosis. Aortic Valve: The aortic valve is tricuspid. Aortic valve regurgitation is not visualized. No aortic stenosis is present. Aortic valve mean gradient  measures 4.0 mmHg. Aortic valve peak gradient measures 7.7 mmHg. Aortic valve area, by VTI measures 2.42 cm. Pulmonic Valve: The pulmonic valve was not well visualized. Pulmonic valve regurgitation is trivial. No evidence of pulmonic stenosis. Aorta: The aortic root is normal in size and structure. Venous: The inferior vena cava was not well visualized. IAS/Shunts: The interatrial septum was not well visualized.  LEFT VENTRICLE PLAX 2D LVIDd:         4.30 cm   Diastology LVIDs:         2.90 cm   LV e' medial:    6.64 cm/s LV PW:         1.20 cm   LV E/e' medial:  7.9 LV IVS:        1.30 cm   LV e' lateral:   6.20 cm/s LVOT diam:     2.00 cm   LV E/e' lateral: 8.5 LV SV:         62 LV SV Index:   31 LVOT Area:     3.14 cm  RIGHT VENTRICLE RV Basal diam:  3.80 cm RV Mid diam:    3.50 cm RV S prime:     14.50 cm/s TAPSE (M-mode): 2.3 cm LEFT ATRIUM             Index        RIGHT ATRIUM           Index LA diam:        3.10 cm 1.57 cm/m   RA Area:     18.40 cm LA Vol (A2C):   34.1 ml 17.29 ml/m  RA Volume:   53.70  ml  27.24 ml/m LA Vol (A4C):   22.1 ml 11.21 ml/m LA Biplane Vol: 27.7 ml 14.05 ml/m  AORTIC VALVE AV Area (Vmax):    2.42 cm AV Area (Vmean):   2.38 cm AV Area (VTI):     2.42 cm AV Vmax:           139.00 cm/s AV Vmean:          93.000 cm/s AV VTI:            0.254 m AV Peak Grad:      7.7 mmHg AV Mean Grad:      4.0 mmHg LVOT Vmax:         107.00 cm/s LVOT Vmean:        70.600 cm/s LVOT VTI:          0.196 m LVOT/AV VTI ratio: 0.77  AORTA Ao Root diam: 2.90 cm Ao Asc diam:  3.00 cm MITRAL VALVE MV Area (PHT): 2.77 cm    SHUNTS MV Decel Time: 274 msec    Systemic VTI:  0.20 m MV E velocity: 52.50 cm/s  Systemic Diam: 2.00 cm MV A velocity: 68.80 cm/s MV E/A ratio:  0.76 Vishnu Priya Mallipeddi Electronically signed by Lorelee Cover Mallipeddi Signature Date/Time: 09/17/2022/11:49:50 AM    Final    CT HEAD WO CONTRAST (5MM)  Result Date: 09/16/2022 CLINICAL DATA:  Headache, sudden, severe  Hydrocephalus EXAM: CT HEAD WITHOUT CONTRAST TECHNIQUE: Contiguous axial images were obtained from the base of the skull through the vertex without intravenous contrast. RADIATION DOSE REDUCTION: This exam was performed according to the departmental dose-optimization program which includes automated exposure control, adjustment of the mA and/or kV according to patient size and/or use of iterative reconstruction technique. COMPARISON:  1:28 p.m. FINDINGS: Brain: 12 x 17 x 20 mm (volume = 2.1 mL) hematoma within the right caudate body/superior thalamus is stable with extension of hemorrhage into the right lateral ventricle, third and fourth ventricle again noted. Ventricular size is normal and stable. No significant mass effect or midline shift. No acute infarct. Cerebellum is unremarkable. Vascular: No hyperdense vessel or unexpected calcification. Skull: Normal. Negative for fracture or focal lesion. Sinuses/Orbits: Visualized orbits are unremarkable. Paranasal sinuses are clear. Other: Mastoid air cells and middle ear cavities are clear. IMPRESSION: 1. Stable right caudate body/superior thalamus hematoma with extension of hemorrhage into the right lateral ventricle, third and fourth ventricle. Ventricular size is normal and stable. No significant mass effect or midline shift. 2. No acute infarct. Electronically Signed   By: Fidela Salisbury M.D.   On: 09/16/2022 21:44   MR BRAIN W WO CONTRAST  Result Date: 09/16/2022 CLINICAL DATA:  Nonhemorrhagic stroke. EXAM: MRI HEAD WITHOUT AND WITH CONTRAST TECHNIQUE: Multiplanar, multiecho pulse sequences of the brain and surrounding structures were obtained without and with intravenous contrast. CONTRAST:  9.87mL GADAVIST GADOBUTROL 1 MMOL/ML IV SOLN COMPARISON:  Head CT and CTA from earlier today FINDINGS: Brain: Known acute hematoma in the right thalamus and adjacent white matter with superior extension into the caudate body and right lateral ventricle. Blood clot reaches  the fourth ventricle, no detected change from earlier CT. Small area of shine through in the left periventricular white matter, other areas of mild diffusion hyperintensity are related to susceptibility artifact from intraventricular clot. White matter disease with notable periventricular ovoid pattern at the left lateral ventricle and hazy T2 hyperintensity in the right pons, demyelinating disease is considered. No abnormal enhancement at the level of the hemorrhage.  No masslike finding throughout the brain. Vascular: Major flow voids and vascular enhancements are preserved. There was preceding CTA. Skull and upper cervical spine: Normal marrow signal. Sinuses/Orbits: Negative. IMPRESSION: 1. No lesion or infarct seen underlying the right thalamic hematoma. No detected rebleeding and no hydrocephalus. 2. Areas of T2 hyperintensity with periventricular and pontine involvement, question symptoms of multiple sclerosis. Given the atheromatous changes by CTA this may instead reflect premature small vessel disease in this patient with hypertension. Electronically Signed   By: Tiburcio Pea M.D.   On: 09/16/2022 19:03   CT ANGIO HEAD NECK W WO CM  Result Date: 09/16/2022 CLINICAL DATA:  Neuro deficit, acute, stroke suspected. Right basal ganglia hemorrhage. Rule out AVM. EXAM: CT ANGIOGRAPHY HEAD AND NECK TECHNIQUE: Multidetector CT imaging of the head and neck was performed using the standard protocol during bolus administration of intravenous contrast. Multiplanar CT image reconstructions and MIPs were obtained to evaluate the vascular anatomy. Carotid stenosis measurements (when applicable) are obtained utilizing NASCET criteria, using the distal internal carotid diameter as the denominator. RADIATION DOSE REDUCTION: This exam was performed according to the departmental dose-optimization program which includes automated exposure control, adjustment of the mA and/or kV according to patient size and/or use of  iterative reconstruction technique. CONTRAST:  39mL OMNIPAQUE IOHEXOL 350 MG/ML SOLN COMPARISON:  None Available. FINDINGS: CTA NECK FINDINGS Aortic arch: Standard 3 vessel aortic arch. Wide patency of the brachiocephalic and subclavian arteries. Right carotid system: Patent without evidence of stenosis or dissection. Left carotid system: Patent with mild atheromatous wall thickening of the mid common carotid artery. No evidence of a significant stenosis or dissection. Vertebral arteries: Patent without evidence of stenosis or dissection. Strongly dominant left vertebral artery. Skeleton: Mild cervical spondylosis. Other neck: No evidence of cervical lymphadenopathy or mass. Upper chest: Clear lung apices. Review of the MIP images confirms the above findings CTA HEAD FINDINGS Anterior circulation: The internal carotid arteries are patent from skull base to carotid termini with mild atherosclerotic irregularity but no significant stenosis. ACAs and MCAs are patent with mild branch vessel irregularity but no evidence of a proximal branch occlusion or significant proximal stenosis. No aneurysm or vascular malformation is identified, and there is no spot sign within the right basal ganglia hemorrhage. Posterior circulation: The intracranial vertebral arteries are widely patent to the basilar. Patent PICA, AICA, and SCA origins are seen bilaterally. The basilar artery is patent with mild atherosclerotic irregularity but no significant stenosis. There is a moderate-sized right posterior communicating artery. Moderate right P1 and mild bilateral P2 stenoses are present. No aneurysm is identified. Venous sinuses: Poorly evaluated due to arterial contrast timing. Anatomic variants: None. Review of the MIP images confirms the above findings IMPRESSION: 1. No evidence of a vascular malformation. 2. Intracranial atherosclerosis including moderate right P1 and mild bilateral P2 stenoses. 3. Widely patent carotid and vertebral  arteries. Electronically Signed   By: Sebastian Ache M.D.   On: 09/16/2022 14:17   CT HEAD CODE STROKE WO CONTRAST  Result Date: 09/16/2022 CLINICAL DATA:  Code stroke.  Neuro deficit, acute, stroke suspected EXAM: CT HEAD WITHOUT CONTRAST TECHNIQUE: Contiguous axial images were obtained from the base of the skull through the vertex without intravenous contrast. RADIATION DOSE REDUCTION: This exam was performed according to the departmental dose-optimization program which includes automated exposure control, adjustment of the mA and/or kV according to patient size and/or use of iterative reconstruction technique. COMPARISON:  None Available. FINDINGS: Brain: Approximately 1.6 x 1.4 x 2.0 cm  acute hemorrhage (estimated volume of 2.2 mL) in the right basal ganglia extending and overlying corona radiata. Intraventricular extension of hemorrhage into the right lateral ventricle, third ventricle, and fourth ventricle. No hydrocephalus at this time. No midline shift. No evidence of acute large vascular territory infarct. Vascular: No hyperdense vessel identified. Skull: No acute fracture. Sinuses/Orbits: Mild right sphenoid sinus mucosal thickening. Other: No mastoid effusions. ASPECTS [facility] Stroke Program Early CT Score) IMPRESSION: Acute hemorrhage in the right basal ganglia extend overlying corona radiata, described above. Intraventricular extension of hemorrhage. Findings discussed with Dr. Selina Cooley at 1:34 PM via telephone. Electronically Signed   By: Feliberto Harts M.D.   On: 09/16/2022 13:35    Labs:  Basic Metabolic Panel: Recent Labs  Lab 10/07/22 0556 10/10/22 0614 10/13/22 0614  NA 138 136 137  K 3.7 4.3 4.2  CL 103 102 104  CO2 26 25 24   GLUCOSE 121* 106* 101*  BUN 16 18 31*  CREATININE 0.84 0.95 1.09*  CALCIUM 9.2 9.7 9.3    CBC:    Latest Ref Rng & Units 10/10/2022    6:14 AM 10/02/2022    7:06 AM 10/01/2022    9:59 AM  CBC  WBC 4.0 - 10.5 K/uL 8.1  9.1  8.0   Hemoglobin 12.0 -  15.0 g/dL 10/03/2022  93.2  35.5   Hematocrit 36.0 - 46.0 % 40.3  35.4  36.4   Platelets 150 - 400 K/uL 387  237  248      CBG: No results for input(s): "GLUCAP" in the last 168 hours.  Brief HPI:   Kelly Stephenson is a 52 y.o. female with history of hypertension but no medications times years, chronic right shoulder pain with chronic Aleve use daily who was admitted on 09/16/2022 from work with sudden onset of left facial droop, LUE and LLE weakness with sensory deficits and problems speaking.  BP at admission was elevated to 10/110 and CT of head done showing acute hemorrhage in right basal ganglia and overlying corona radiata with extension into right lateral, third and fourth ventricles.  CTA head/neck was negative for vascular malformation.  She was started on Cleviprex and transferred to [facility] for management.  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  2D echo showed EF 60 to 65% with no wall abnormality.  Dr. [facility] felt the stroke was due to uncontrolled hypertension and BP meds added and titrated for better control.  Her lethargy was resolving however she continued to be limited by nausea/vomiting, poor p.o. intake, headaches as well as labile blood pressures.  Therapy was working with patient who continued to be limited by left-sided weakness with sensory deficits, bouts of lethargy as well as cognitive deficits.  CIR was recommended due to functional decline.   Hospital Course: Kelly Stephenson was admitted to rehab 09/20/2022 for inpatient therapies to consist of PT, ST and OT at least three hours five days a week. Past admission physiatrist, therapy team and rehab RN have worked together to provide customized collaborative inpatient rehab.   Blood pressures were monitored on TID basis and    Rehab course: During patient's stay in rehab weekly team conferences were held to monitor patient's progress, set goals and discuss barriers to discharge. At  admission, patient required mod assist with mobility and max assist with ADL tasks. She exhibited cognitive deficits affecting orientation, attention, memory and problem solving.  She  has had improvement in activity tolerance, balance, postural  control as well as ability to compensate for deficits. She has had improvement in functional use LUE  and LLE as well as improvement in awareness. She is able to complete ADL tasks with min assist. She requires CGA  for transfers and to ambulate 100' with RW and cues for gait and safety. She is able to complete cognitive tasks with supervision. Family education has been completed regarding all aspects of safety and care. Marland Kitchen     Discharge disposition: 01-Home or Self Care  Diet: Heart Healthy.   Special Instructions:  Discharge Instructions     Ambulatory referral to Neurology   Complete by: As directed    Follow up with stroke clinic NP (Jessica Vanschaick or Darrol Angel, if both not available, consider Manson Allan, or Ahern) at [facility] in about 4 weeks. Thanks.   Ambulatory referral to Physical Medicine Rehab   Complete by: As directed    TC appt   Ambulatory referral to Psychiatry   Complete by: As directed       Allergies as of 10/13/2022   No Known Allergies      Medication List     STOP taking these medications    amLODipine 10 MG tablet Commonly known as: NORVASC   hydrALAZINE 100 MG tablet Commonly known as: APRESOLINE   hydrALAZINE 20 MG/ML injection Commonly known as: APRESOLINE   labetalol 5 MG/ML injection Commonly known as: NORMODYNE   rosuvastatin 40 MG tablet Commonly known as: CRESTOR       TAKE these medications    acetaminophen 325 MG tablet Commonly known as: TYLENOL Take 2 tablets (650 mg total) by mouth every 4 (four) hours as needed for mild pain. What changed: reasons to take this   fluticasone 50 MCG/ACT nasal spray Commonly known as: FLONASE Place 1 spray into both nostrils daily.   Foltanx  3-35-2 MG Tabs Take 1 tablet by mouth daily.   haloperidol 1 MG tablet Commonly known as: HALDOL Take 2 tablets (2 mg total) by mouth as needed for agitation. Take with ativan as needed if you feel that you are getting ready to breakdown or have severe anxiety attack.   loratadine 10 MG tablet Commonly known as: CLARITIN Take 1 tablet (10 mg total) by mouth daily.   LORazepam 1 MG tablet Commonly known as: Ativan Take 1 tablet (1 mg total) by mouth as needed. Take with 2 haldol tablets if you get in to severe anxiety/breakdown state.   losartan 25 MG tablet Commonly known as: COZAAR Take 1 tablet (25 mg total) by mouth daily. What changed:  medication strength how much to take   magnesium oxide 400 MG tablet Commonly known as: MAG-OX Take 1 tablet (400 mg total) by mouth at bedtime.   melatonin 3 MG Tabs tablet Take 1 tablet (3 mg total) by mouth at bedtime.   metoprolol tartrate 50 MG tablet Commonly known as: LOPRESSOR Take 1.5 tablets (75 mg total) by mouth 2 (two) times daily. What changed: how much to take   oxyCODONE 5 MG immediate release tablet Commonly known as: Oxy IR/ROXICODONE Take 1 tablet (5 mg total) by mouth every 8 (eight) hours as needed for severe pain. What changed: when to take this Notes to patient: LIMIT TO ONE PILL PER DAY FOR SEVERE HEADACHE   pantoprazole 40 MG tablet Commonly known as: PROTONIX Take 1 tablet (40 mg total) by mouth daily.   sertraline 50 MG tablet Commonly known as: ZOLOFT Take 1.5 tablets (75 mg total) by  mouth at bedtime.        Follow-up Information     [facility] Follow up in 1 month(s).   Specialty: Neurology Why: stroke clinic, office will call you with follow up appointment Contact information: [address] [place] [id number] [phone]        Malva LimesFisher, Donald E, MD Follow up.   Specialty: Family Medicine Why: Call in 1-2 days for post hospital  follow up Contact information: [address] [place] [id number] [phone]         Horton Chinaulkar, Krutika P, MD Follow up.   Specialty: Physical Medicine and Rehabilitation Why: office will call you with follow up appointment Contact information: [address] [place] [address] [phone]                 Signed: Jacquelynn Creeamela S Khing Belcher 10/13/2022, 6:23 PM

## 2022-10-16 NOTE — Discharge Summary (Incomplete)
Physician Discharge Summary  Patient ID: LIANNAH Stephenson MRN: [id number] DOB/AGE: [birth year] 52 y.o.  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 EXAM: ULTRASOUND ABDOMEN LIMITED RIGHT UPPER QUADRANT COMPARISON:  CT dated 09/30/2022. FINDINGS: Gallbladder: No gallstones or wall thickening visualized. No sonographic Murphy sign noted by sonographer. Common bile duct: Diameter: 2 mm Liver: The liver is unremarkable. Portal vein is patent on color Doppler imaging with normal direction of blood flow towards the liver. Other: None. IMPRESSION: Unremarkable right upper quadrant ultrasound. Electronically Signed   By: Anner Crete M.D.   On: 09/30/2022 20:27   DG Chest 2 View  Result Date: 09/30/2022 CLINICAL DATA:  Fever EXAM: CHEST - 2 VIEW COMPARISON:  None Available. FINDINGS: Unchanged cardiomediastinal silhouette. There is no focal airspace consolidation. There is no pleural effusion or evidence of pneumothorax. There is no acute osseous abnormality. Globular calcifications overlies the right acromiohumeral interval IMPRESSION: No evidence of acute cardiopulmonary disease. Calcific tendinosis of the distal right rotator cuff. Electronically Signed   By: Maurine Simmering M.D.   On: 09/30/2022 16:09   CT ABDOMEN PELVIS WO CONTRAST  Result Date: 09/30/2022 CLINICAL DATA:  Acute abdominal pain EXAM: CT ABDOMEN AND PELVIS WITHOUT CONTRAST  TECHNIQUE: Multidetector CT imaging of the abdomen and pelvis was performed following the standard protocol without IV contrast. RADIATION DOSE REDUCTION: This exam was performed according to the departmental dose-optimization program which includes automated exposure control, adjustment of the mA and/or kV according to patient size and/or use of iterative reconstruction technique. COMPARISON:  None Available. FINDINGS: Lower chest: Solid pulmonary nodule of the left lower lobe measuring 5 mm on series 5, image 7 Hepatobiliary: No focal liver abnormality. Gallbladder is distended with possible mild wall thickening, lack of IV contrast limits evaluation. No biliary ductal dilation. Pancreas: Unremarkable. No pancreatic ductal dilatation or surrounding inflammatory changes. Spleen: Normal in size without focal abnormality. Adrenals/Urinary Tract: Bilateral adrenal glands are unremarkable. No hydronephrosis or nephrolithiasis. Bladder is unremarkable. Stomach/Bowel: Stomach is within normal limits. Appendix appears normal. No evidence of bowel wall thickening, distention, or inflammatory changes. Vascular/Lymphatic: No significant vascular findings are present. No enlarged abdominal or pelvic lymph nodes. Reproductive: No adnexal masses. Other: No abdominal wall hernia or abnormality. No abdominopelvic ascites. Musculoskeletal: No acute or significant osseous findings. IMPRESSION: 1. Gallbladder is distended with possible mild wall thickening, lack of IV contrast limits evaluation. Correlate for symptoms of right upper quadrant pain and consider gallbladder ultrasound for further evaluation. 2. Solid pulmonary nodule of the left lower lobe measuring 5 mm. No follow-up needed if patient is low-risk.This recommendation follows the consensus statement: Guidelines for Management of Incidental Pulmonary Nodules Detected on CT Images: From the Fleischner Society 2017; Radiology 2017; 284:228-243. Electronically Signed   By:  Yetta Glassman M.D.   On: 09/30/2022 15:49   CT HEAD WO CONTRAST (5MM)  Result Date: 09/30/2022 CLINICAL DATA:  Hemorrhagic stroke follow-up. EXAM: CT HEAD WITHOUT CONTRAST TECHNIQUE: Contiguous axial images were obtained from the base of  the skull through the vertex without intravenous contrast. RADIATION DOSE REDUCTION: This exam was performed according to the departmental dose-optimization program which includes automated exposure control, adjustment of the mA and/or kV according to patient size and/or use of iterative reconstruction technique. COMPARISON:  CT head dated September 28, 2022. FINDINGS: Brain: Right thalamic hemorrhage has slightly decreased in size and density since the prior study. Surrounding edema is similar. No new infarct, hydrocephalus, extra-axial collection or mass lesion. No midline shift. Vascular: No hyperdense vessel or unexpected calcification. Skull: Normal. Negative for fracture or focal lesion. Sinuses/Orbits: No acute finding. Other: None. IMPRESSION: 1. Slightly decreased size and density of the right thalamic hemorrhage. No new acute finding. Electronically Signed   By: Obie Dredge M.D.   On: 09/30/2022 15:42   DG Abd 1 View  Result Date: 09/30/2022 CLINICAL DATA:  Nausea vomiting EXAM: ABDOMEN - 1 VIEW COMPARISON:  09/29/2022 FINDINGS: The bowel gas pattern is normal. No radio-opaque calculi or other significant radiographic abnormality are seen. IMPRESSION: Negative. Electronically Signed   By: Layla Maw M.D.   On: 09/30/2022 12:59   DG Abd 1 View  Result Date: 09/29/2022 CLINICAL DATA:  [id number] Emesis [id number] EXAM: ABDOMEN - 1 VIEW COMPARISON:  None Available. FINDINGS: Gaseous distension of colon. No dilated loops of small bowel are visualized. Air is visualized in the rectum. Pelvic phleboliths. IMPRESSION: Nonobstructive bowel gas pattern. If persistent concern for obstructive physiology, recommend dedicated CT abdomen pelvis with contrast.  Electronically Signed   By: Meda Klinefelter M.D.   On: 09/29/2022 16:51   CT HEAD WO CONTRAST ( )  Result Date: 09/29/2022 CLINICAL DATA:  Worsening headache EXAM: CT HEAD WITHOUT CONTRAST TECHNIQUE: Contiguous axial images were obtained from the base of the skull through the vertex without intravenous contrast. RADIATION DOSE REDUCTION: This exam was performed according to the departmental dose-optimization program which includes automated exposure control, adjustment of the mA and/or kV according to patient size and/or use of iterative reconstruction technique. 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 Likely unchanged surrounding edema and mild regional mass effect. No acute infarct, new hemorrhage, mass, or midline shift. No hydrocephalus or extra-axial collection. Vascular: No hyperdense vessel. Skull: Negative for fracture or focal lesion. Sinuses/Orbits: Clear paranasal sinuses. No acute finding in the orbits. Other: The mastoids are well aerated. IMPRESSION: 1. 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 2. No acute intracranial process. Electronically Signed   By: Wiliam Ke M.D.   On: 09/29/2022 01:43   CT HEAD WO CONTRAST ( )  Result Date: 09/24/2022 CLINICAL DATA:  52 year old female who presented with right basal ganglia hemorrhage on January 5th. Subsequent encounter. EXAM: CT HEAD WITHOUT CONTRAST TECHNIQUE: Contiguous axial images were obtained from the base of the skull through the vertex without intravenous contrast. RADIATION DOSE REDUCTION: This exam was performed according to the departmental dose-optimization program which includes automated exposure control, adjustment of the mA and/or kV according to patient size and/or use of  iterative reconstruction technique. COMPARISON:  09/19/2022 and earlier. FINDINGS: Brain: Hyperdense right lateral thalamic hemorrhage is stable and size and has begun to fade. More indistinct appearance of blood products now. Continued surrounding edema. Stable mild regional mass effect. Intraventricular blood has largely resolved. No ventriculomegaly. Basilar cisterns remain patent. Stable gray-white differentiation elsewhere. No new No acute intracranial  abnormality. Vascular: Calcified atherosclerosis at the skull base. No suspicious intracranial vascular hyperdensity. Skull: No acute osseous abnormality identified. Sinuses/Orbits: Visualized paranasal sinuses and mastoids are stable and well aerated. Other: Visualized orbits and scalp soft tissues are within normal limits. IMPRESSION: 1. Resolved intraventricular hemorrhage and fading of the right lateral thalamic blood products since 09/19/2022. Stable mild regional edema and mass effect. 2. No new intracranial abnormality. Electronically Signed   By: Genevie Ann M.D.   On: 09/24/2022 10:21   DG Wrist 2 Views Left  Result Date: 09/23/2022 CLINICAL DATA:  Left-sided wrist pain EXAM: LEFT WRIST - 2 VIEW COMPARISON:  None Available. FINDINGS: There is no evidence of fracture or dislocation. There is no evidence of arthropathy or other focal bone abnormality. Soft tissues are unremarkable. If there is persistent pain or further concern for scaphoid injury recommend follow-up imaging in 7-10 days as these injuries can be acutely x-ray occult. IMPRESSION: No acute osseous abnormality Electronically Signed   By: Jill Side M.D.   On: 09/23/2022 17:44   CT HEAD WO CONTRAST (5MM)  Result Date: 09/19/2022 CLINICAL DATA:  Intracranial hemorrhage EXAM: CT HEAD WITHOUT CONTRAST TECHNIQUE: Contiguous axial images were obtained from the base of the skull through the vertex without intravenous contrast. RADIATION DOSE REDUCTION: This exam was performed according to the  departmental dose-optimization program which includes automated exposure control, adjustment of the mA and/or kV according to patient size and/or use of iterative reconstruction technique. COMPARISON:  CT head 09/16/2022 FINDINGS: Brain: The 1.6 cm AP x 1.0 cm TV x 1.9 cm cc right thalamic hematoma is unchanged in size. Intraventricular extension of the body of the right lateral ventricle is similar to the prior study; however, blood in the third and fourth ventricles is decreased. There is mild surrounding edema with partial effacement of the adjacent right lateral ventricle but no midline shift. The ventricular system is stable in size and configuration without evidence of developing hydrocephalus. There is no new acute intracranial hemorrhage or extra-axial fluid collection. There is no acute territorial infarct. There is no mass lesion. Vascular: No hyperdense vessel or unexpected calcification. Skull: Normal. Negative for fracture or focal lesion. Sinuses/Orbits: Stable. IMPRESSION: 1. Stable size of the right thalamic hematoma with mild surrounding edema but no midline shift, and decreased intraventricular blood 2. No new acute intracranial pathology. Electronically Signed   By: Valetta Mole M.D.   On: 09/19/2022 20:34   ECHOCARDIOGRAM COMPLETE  Result Date: 09/17/2022    ECHOCARDIOGRAM REPORT   Patient Name:   ZYARA RILING Date of Exam: 09/17/2022 Medical Rec #:  [id number]        Height:       64.0 in Accession #:    [id number]       Weight:       203.7 lb Date of Birth:  1971/01/21        BSA:          1.972 m Patient Age:    40 years         BP:           125/79 mmHg Patient Gender: F                HR:           86 bpm. Exam Location:  Inpatient Procedure: 2D Echo, Cardiac Doppler and Color Doppler Indications:    Stroke  History:        Patient has no prior history of Echocardiogram  examinations.                 Risk Factors:Hypertension.  Sonographer:    Clayton Lefort RDCS (AE) Referring Phys: Altamease Oiler DE LA TORRE  Sonographer Comments: Suboptimal subcostal window. IMPRESSIONS  1. Left ventricular ejection fraction, by estimation, is 60 to 65%. The left ventricle has normal function. The left ventricle has no regional wall motion abnormalities. There is mild left ventricular hypertrophy. Left ventricular diastolic parameters are consistent with Grade I diastolic dysfunction (impaired relaxation).  2. Right ventricular systolic function is normal. The right ventricular size is mildly enlarged.  3. The mitral valve is abnormal. Trivial mitral valve regurgitation. No evidence of mitral stenosis.  4. The aortic valve is tricuspid. Aortic valve regurgitation is not visualized. No aortic stenosis is present. Comparison(s): No prior Echocardiogram. FINDINGS  Left Ventricle: Left ventricular ejection fraction, by estimation, is 60 to 65%. The left ventricle has normal function. The left ventricle has no regional wall motion abnormalities. The left ventricular internal cavity size was normal in size. There is  mild left ventricular hypertrophy. Left ventricular diastolic parameters are consistent with Grade I diastolic dysfunction (impaired relaxation). Right Ventricle: The right ventricular size is mildly enlarged. No increase in right ventricular wall thickness. Right ventricular systolic function is normal. Left Atrium: Left atrial size was normal in size. Right Atrium: Right atrial size was normal in size. Pericardium: There is no evidence of pericardial effusion. Mitral Valve: The mitral valve is abnormal. Mild to moderate mitral annular calcification. Trivial mitral valve regurgitation. No evidence of mitral valve stenosis. Tricuspid Valve: The tricuspid valve is not well visualized. Tricuspid valve regurgitation is not demonstrated. No evidence of tricuspid stenosis. Aortic Valve: The aortic valve is tricuspid. Aortic valve regurgitation is not visualized. No aortic stenosis is present. Aortic valve mean gradient  measures 4.0 mmHg. Aortic valve peak gradient measures 7.7 mmHg. Aortic valve area, by VTI measures 2.42 cm. Pulmonic Valve: The pulmonic valve was not well visualized. Pulmonic valve regurgitation is trivial. No evidence of pulmonic stenosis. Aorta: The aortic root is normal in size and structure. Venous: The inferior vena cava was not well visualized. IAS/Shunts: The interatrial septum was not well visualized.  LEFT VENTRICLE PLAX 2D LVIDd:         4.30 cm   Diastology LVIDs:         2.90 cm   LV e' medial:    6.64 cm/s LV PW:         1.20 cm   LV E/e' medial:  7.9 LV IVS:        1.30 cm   LV e' lateral:   6.20 cm/s LVOT diam:     2.00 cm   LV E/e' lateral: 8.5 LV SV:         62 LV SV Index:   31 LVOT Area:     3.14 cm  RIGHT VENTRICLE RV Basal diam:  3.80 cm RV Mid diam:    3.50 cm RV S prime:     14.50 cm/s TAPSE (M-mode): 2.3 cm LEFT ATRIUM             Index        RIGHT ATRIUM           Index LA diam:        3.10 cm 1.57 cm/m   RA Area:     18.40 cm LA Vol (A2C):   34.1 ml 17.29 ml/m  RA Volume:   53.70  ml  27.24 ml/m LA Vol (A4C):   22.1 ml 11.21 ml/m LA Biplane Vol: 27.7 ml 14.05 ml/m  AORTIC VALVE AV Area (Vmax):    2.42 cm AV Area (Vmean):   2.38 cm AV Area (VTI):     2.42 cm AV Vmax:           139.00 cm/s AV Vmean:          93.000 cm/s AV VTI:            0.254 m AV Peak Grad:      7.7 mmHg AV Mean Grad:      4.0 mmHg LVOT Vmax:         107.00 cm/s LVOT Vmean:        70.600 cm/s LVOT VTI:          0.196 m LVOT/AV VTI ratio: 0.77  AORTA Ao Root diam: 2.90 cm Ao Asc diam:  3.00 cm MITRAL VALVE MV Area (PHT): 2.77 cm    SHUNTS MV Decel Time: 274 msec    Systemic VTI:  0.20 m MV E velocity: 52.50 cm/s  Systemic Diam: 2.00 cm MV A velocity: 68.80 cm/s MV E/A ratio:  0.76 Vishnu Priya Mallipeddi Electronically signed by Lorelee Cover Mallipeddi Signature Date/Time: 09/17/2022/11:49:50 AM    Final    CT HEAD WO CONTRAST (5MM)  Result Date: 09/16/2022 CLINICAL DATA:  Headache, sudden, severe  Hydrocephalus EXAM: CT HEAD WITHOUT CONTRAST TECHNIQUE: Contiguous axial images were obtained from the base of the skull through the vertex without intravenous contrast. RADIATION DOSE REDUCTION: This exam was performed according to the departmental dose-optimization program which includes automated exposure control, adjustment of the mA and/or kV according to patient size and/or use of iterative reconstruction technique. COMPARISON:  1:28 p.m. FINDINGS: Brain: 12 x 17 x 20 mm (volume = 2.1 mL) hematoma within the right caudate body/superior thalamus is stable with extension of hemorrhage into the right lateral ventricle, third and fourth ventricle again noted. Ventricular size is normal and stable. No significant mass effect or midline shift. No acute infarct. Cerebellum is unremarkable. Vascular: No hyperdense vessel or unexpected calcification. Skull: Normal. Negative for fracture or focal lesion. Sinuses/Orbits: Visualized orbits are unremarkable. Paranasal sinuses are clear. Other: Mastoid air cells and middle ear cavities are clear. IMPRESSION: 1. Stable right caudate body/superior thalamus hematoma with extension of hemorrhage into the right lateral ventricle, third and fourth ventricle. Ventricular size is normal and stable. No significant mass effect or midline shift. 2. No acute infarct. Electronically Signed   By: Fidela Salisbury M.D.   On: 09/16/2022 21:44   MR BRAIN W WO CONTRAST  Result Date: 09/16/2022 CLINICAL DATA:  Nonhemorrhagic stroke. EXAM: MRI HEAD WITHOUT AND WITH CONTRAST TECHNIQUE: Multiplanar, multiecho pulse sequences of the brain and surrounding structures were obtained without and with intravenous contrast. CONTRAST:  9.87mL GADAVIST GADOBUTROL 1 MMOL/ML IV SOLN COMPARISON:  Head CT and CTA from earlier today FINDINGS: Brain: Known acute hematoma in the right thalamus and adjacent white matter with superior extension into the caudate body and right lateral ventricle. Blood clot reaches  the fourth ventricle, no detected change from earlier CT. Small area of shine through in the left periventricular white matter, other areas of mild diffusion hyperintensity are related to susceptibility artifact from intraventricular clot. White matter disease with notable periventricular ovoid pattern at the left lateral ventricle and hazy T2 hyperintensity in the right pons, demyelinating disease is considered. No abnormal enhancement at the level of the hemorrhage.  No masslike finding throughout the brain. Vascular: Major flow voids and vascular enhancements are preserved. There was preceding CTA. Skull and upper cervical spine: Normal marrow signal. Sinuses/Orbits: Negative. IMPRESSION: 1. No lesion or infarct seen underlying the right thalamic hematoma. No detected rebleeding and no hydrocephalus. 2. Areas of T2 hyperintensity with periventricular and pontine involvement, question symptoms of multiple sclerosis. Given the atheromatous changes by CTA this may instead reflect premature small vessel disease in this patient with hypertension. Electronically Signed   By: Tiburcio Pea M.D.   On: 09/16/2022 19:03   CT ANGIO HEAD NECK W WO CM  Result Date: 09/16/2022 CLINICAL DATA:  Neuro deficit, acute, stroke suspected. Right basal ganglia hemorrhage. Rule out AVM. EXAM: CT ANGIOGRAPHY HEAD AND NECK TECHNIQUE: Multidetector CT imaging of the head and neck was performed using the standard protocol during bolus administration of intravenous contrast. Multiplanar CT image reconstructions and MIPs were obtained to evaluate the vascular anatomy. Carotid stenosis measurements (when applicable) are obtained utilizing NASCET criteria, using the distal internal carotid diameter as the denominator. RADIATION DOSE REDUCTION: This exam was performed according to the departmental dose-optimization program which includes automated exposure control, adjustment of the mA and/or kV according to patient size and/or use of  iterative reconstruction technique. CONTRAST:  39mL OMNIPAQUE IOHEXOL 350 MG/ML SOLN COMPARISON:  None Available. FINDINGS: CTA NECK FINDINGS Aortic arch: Standard 3 vessel aortic arch. Wide patency of the brachiocephalic and subclavian arteries. Right carotid system: Patent without evidence of stenosis or dissection. Left carotid system: Patent with mild atheromatous wall thickening of the mid common carotid artery. No evidence of a significant stenosis or dissection. Vertebral arteries: Patent without evidence of stenosis or dissection. Strongly dominant left vertebral artery. Skeleton: Mild cervical spondylosis. Other neck: No evidence of cervical lymphadenopathy or mass. Upper chest: Clear lung apices. Review of the MIP images confirms the above findings CTA HEAD FINDINGS Anterior circulation: The internal carotid arteries are patent from skull base to carotid termini with mild atherosclerotic irregularity but no significant stenosis. ACAs and MCAs are patent with mild branch vessel irregularity but no evidence of a proximal branch occlusion or significant proximal stenosis. No aneurysm or vascular malformation is identified, and there is no spot sign within the right basal ganglia hemorrhage. Posterior circulation: The intracranial vertebral arteries are widely patent to the basilar. Patent PICA, AICA, and SCA origins are seen bilaterally. The basilar artery is patent with mild atherosclerotic irregularity but no significant stenosis. There is a moderate-sized right posterior communicating artery. Moderate right P1 and mild bilateral P2 stenoses are present. No aneurysm is identified. Venous sinuses: Poorly evaluated due to arterial contrast timing. Anatomic variants: None. Review of the MIP images confirms the above findings IMPRESSION: 1. No evidence of a vascular malformation. 2. Intracranial atherosclerosis including moderate right P1 and mild bilateral P2 stenoses. 3. Widely patent carotid and vertebral  arteries. Electronically Signed   By: Sebastian Ache M.D.   On: 09/16/2022 14:17   CT HEAD CODE STROKE WO CONTRAST  Result Date: 09/16/2022 CLINICAL DATA:  Code stroke.  Neuro deficit, acute, stroke suspected EXAM: CT HEAD WITHOUT CONTRAST TECHNIQUE: Contiguous axial images were obtained from the base of the skull through the vertex without intravenous contrast. RADIATION DOSE REDUCTION: This exam was performed according to the departmental dose-optimization program which includes automated exposure control, adjustment of the mA and/or kV according to patient size and/or use of iterative reconstruction technique. COMPARISON:  None Available. FINDINGS: Brain: Approximately 1.6 x 1.4 x 2.0 cm  acute hemorrhage (estimated volume of 2.2 mL) in the right basal ganglia extending and overlying corona radiata. Intraventricular extension of hemorrhage into the right lateral ventricle, third ventricle, and fourth ventricle. No hydrocephalus at this time. No midline shift. No evidence of acute large vascular territory infarct. Vascular: No hyperdense vessel identified. Skull: No acute fracture. Sinuses/Orbits: Mild right sphenoid sinus mucosal thickening. Other: No mastoid effusions. ASPECTS [facility] Stroke Program Early CT Score) IMPRESSION: Acute hemorrhage in the right basal ganglia extend overlying corona radiata, described above. Intraventricular extension of hemorrhage. Findings discussed with Dr. Quinn Axe at 1:34 PM via telephone. Electronically Signed   By: Margaretha Sheffield M.D.   On: 09/16/2022 13:35    Labs:  Basic Metabolic Panel: Recent Labs  Lab 10/07/22 0556 10/10/22 0614 10/13/22 0614  NA 138 136 137  K 3.7 4.3 4.2  CL 103 102 104  CO2 26 25 24   GLUCOSE 121* 106* 101*  BUN 16 18 31*  CREATININE 0.84 0.95 1.09*  CALCIUM 9.2 9.7 9.3    CBC:    Latest Ref Rng & Units 10/10/2022    6:14 AM 10/02/2022    7:06 AM 10/01/2022    9:59 AM  CBC  WBC 4.0 - 10.5 K/uL 8.1  9.1  8.0   Hemoglobin 12.0 -  15.0 g/dL 13.8  12.6  12.5   Hematocrit 36.0 - 46.0 % 40.3  35.4  36.4   Platelets 150 - 400 K/uL 387  237  248      CBG: No results for input(s): "GLUCAP" in the last 168 hours.  Brief HPI:   Kelly Stephenson is a 52 y.o. female with history of hypertension but no medications times years, chronic right shoulder pain with chronic Aleve use daily who was admitted on 09/16/2022 from work with sudden onset of left facial droop, LUE and LLE weakness with sensory deficits and problems speaking.  BP at admission was elevated to 10/110 and CT of head done showing acute hemorrhage in right basal ganglia and overlying corona radiata with extension into right lateral, third and fourth ventricles.  CTA head/neck was negative for vascular malformation.  She was started on Cleviprex and transferred to [facility] for management.  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  2D echo showed EF 60 to 65% with no wall abnormality.  Dr. Erlinda Hong felt the stroke was due to uncontrolled hypertension and BP meds added and titrated for better control.  Her lethargy was resolving however she continued to be limited by nausea/vomiting, poor p.o. intake, headaches as well as labile blood pressures.  Therapy was working with patient who continued to be limited by left-sided weakness with sensory deficits, bouts of lethargy as well as cognitive deficits.  CIR was recommended due to functional decline.   Hospital Course: FARZANA KOCI was admitted to rehab 09/20/2022 for inpatient therapies to consist of PT, ST and OT at least three hours five days a week. Past admission physiatrist, therapy team and rehab RN have worked together to provide customized collaborative inpatient rehab. Blood pressures were monitored on TID basis and medications have been titrated throughout her stay.  As reviewed steady state, with increase in activity she was noted to have orthostatic changes therefore hydralazine  and amlodipine were  discontinued and Cozaar was decreased to 25 mg daily.  Blood pressures are controlled on this regimen and patient has been educated extensively on medication compliance.  Hospital stay has been eventful in regards to  issues with lability as well as mood.  She was noted to have bouts of lability with pseudobulbar affect and her DEXA was attempted but discontinued due to side effects.  She has had high levels of anxiety and Lexapro was added and titrated upwards for mood stabilization.  CT of head with has been repeated on 1/13, 1/17 and 1/19 and showed decrease in size of bleed.  She continued to have issues with acute on chronic headaches and Topamax was added but discontinued due to sedating side effects.  Oxycodone was added and has been used on as needed basis to help manage pain.  Magnesium supplement was added due to suboptimal levels.  B12 deficiency was treated with dose of IM B12 on 01/11.  .   Rehab course: During patient's stay in rehab weekly team conferences were held to monitor patient's progress, set goals and discuss barriers to discharge. At admission, patient required mod assist with mobility and max assist with ADL tasks. She exhibited cognitive deficits affecting orientation, attention, memory and problem solving.  She  has had improvement in activity tolerance, balance, postural control as well as ability to compensate for deficits. She has had improvement in functional use LUE  and LLE as well as improvement in awareness. She is able to complete ADL tasks with min assist. She requires CGA  for transfers and to ambulate 100' with RW and cues for gait and safety. She is able to complete cognitive tasks with supervision. Family education has been completed regarding all aspects of safety and care. Marland Kitchen     Discharge disposition: 01-Home or Self Care  Diet: Heart Healthy.   Special Instructions:  Discharge Instructions     Ambulatory referral to Neurology    Complete by: As directed    Follow up with stroke clinic NP (Jessica Vanschaick or Darrol Angel, if both not available, consider Manson Allan, or Ahern) at [facility] in about 4 weeks. Thanks.   Ambulatory referral to Physical Medicine Rehab   Complete by: As directed    TC appt   Ambulatory referral to Psychiatry   Complete by: As directed       Allergies as of 10/13/2022   No Known Allergies      Medication List     STOP taking these medications    amLODipine 10 MG tablet Commonly known as: NORVASC   hydrALAZINE 100 MG tablet Commonly known as: APRESOLINE   hydrALAZINE 20 MG/ML injection Commonly known as: APRESOLINE   labetalol 5 MG/ML injection Commonly known as: NORMODYNE   rosuvastatin 40 MG tablet Commonly known as: CRESTOR       TAKE these medications    acetaminophen 325 MG tablet Commonly known as: TYLENOL Take 2 tablets (650 mg total) by mouth every 4 (four) hours as needed for mild pain. What changed: reasons to take this   fluticasone 50 MCG/ACT nasal spray Commonly known as: FLONASE Place 1 spray into both nostrils daily.   Foltanx 3-35-2 MG Tabs Take 1 tablet by mouth daily.   haloperidol 1 MG tablet Commonly known as: HALDOL Take 2 tablets (2 mg total) by mouth as needed for agitation. Take with ativan as needed if you feel that you are getting ready to breakdown or have severe anxiety attack.   loratadine 10 MG tablet Commonly known as: CLARITIN Take 1 tablet (10 mg total) by mouth daily.   LORazepam 1 MG tablet Commonly known as: Ativan Take 1 tablet (1 mg total) by mouth as needed. Take with  2 haldol tablets if you get in to severe anxiety/breakdown state.   losartan 25 MG tablet Commonly known as: COZAAR Take 1 tablet (25 mg total) by mouth daily. What changed:  medication strength how much to take   magnesium oxide 400 MG tablet Commonly known as: MAG-OX Take 1 tablet (400 mg total) by mouth at bedtime.   melatonin 3 MG Tabs  tablet Take 1 tablet (3 mg total) by mouth at bedtime.   metoprolol tartrate 50 MG tablet Commonly known as: LOPRESSOR Take 1.5 tablets (75 mg total) by mouth 2 (two) times daily. What changed: how much to take   oxyCODONE 5 MG immediate release tablet Commonly known as: Oxy IR/ROXICODONE Take 1 tablet (5 mg total) by mouth every 8 (eight) hours as needed for severe pain. What changed: when to take this Notes to patient: LIMIT TO ONE PILL PER DAY FOR SEVERE HEADACHE   pantoprazole 40 MG tablet Commonly known as: PROTONIX Take 1 tablet (40 mg total) by mouth daily.   sertraline 50 MG tablet Commonly known as: ZOLOFT Take 1.5 tablets (75 mg total) by mouth at bedtime.        Follow-up Information     [facility] Follow up in 1 month(s).   Specialty: Neurology Why: stroke clinic, office will call you with follow up appointment Contact information: [address] [place] [address] [phone]        Malva Limes, MD Follow up.   Specialty: Family Medicine Why: Call in 1-2 days for post hospital follow up Contact information: [address] [place] [address] [phone]         Horton Chin, MD Follow up.   Specialty: Physical Medicine and Rehabilitation Why: office will call you with follow up appointment Contact information: [address] [place] [address] [phone]                 Signed: Jacquelynn Cree 10/13/2022, 6:23 PM

## 2022-10-17 ENCOUNTER — Telehealth: Payer: Self-pay | Admitting: Registered Nurse

## 2022-10-17 NOTE — Telephone Encounter (Signed)
Transition Care Management Unsuccessful Follow-up Telephone Call  Date of discharge and from where:  10/13/2022 Inpatient Rehabilitation   Attempts:  1st Attempt  Reason for unsuccessful TCM follow-up call:  Left voice message

## 2022-10-21 ENCOUNTER — Encounter: Payer: Self-pay | Admitting: Family Medicine

## 2022-10-21 ENCOUNTER — Ambulatory Visit: Payer: BC Managed Care – PPO | Admitting: Family Medicine

## 2022-10-21 VITALS — BP 132/84 | HR 87

## 2022-10-21 DIAGNOSIS — F410 Panic disorder [episodic paroxysmal anxiety] without agoraphobia: Secondary | ICD-10-CM | POA: Diagnosis not present

## 2022-10-21 DIAGNOSIS — I10 Essential (primary) hypertension: Secondary | ICD-10-CM

## 2022-10-21 DIAGNOSIS — Z09 Encounter for follow-up examination after completed treatment for conditions other than malignant neoplasm: Secondary | ICD-10-CM | POA: Diagnosis not present

## 2022-10-21 DIAGNOSIS — I69254 Hemiplegia and hemiparesis following other nontraumatic intracranial hemorrhage affecting left non-dominant side: Secondary | ICD-10-CM

## 2022-10-21 MED ORDER — SERTRALINE HCL 100 MG PO TABS: 100.0000 mg | ORAL_TABLET | Freq: Every day | ORAL | 0 refills | Status: DC

## 2022-10-21 MED ORDER — METOPROLOL TARTRATE 50 MG PO TABS: 75.0000 mg | ORAL_TABLET | Freq: Two times a day (BID) | ORAL | 3 refills | Status: DC

## 2022-10-21 MED ORDER — LOSARTAN POTASSIUM 25 MG PO TABS: 25.0000 mg | ORAL_TABLET | Freq: Every day | ORAL | 3 refills | Status: DC

## 2022-10-21 NOTE — Assessment & Plan Note (Signed)
HF appt today; pt presents with complicated course from both inpatient care as well as rehab stay Acutely anxious in office setting, unable to stand for weight, unable to tolerate automatic bp or allow for door to be shut in office exam room Recommend BP control and neuro (including psych) follow up Referral placed to [facility] per pt's preference given this episode of leaving home and the anxiety which it has provoked  Encouraged to use PRN medications provided previously to assist in feelings of panic Unable to obtain lab samples today d/t acute distress; pt and family decline hospital transfer as pt was stable at home prior to appt today

## 2022-10-21 NOTE — Assessment & Plan Note (Signed)
Chronic, borderline Goal <130/<80 Continue to recommend home BP cuff for monitoring Continue current regimen- goal to check/record 1x/day Losartan 25 mg, 75 mg metoprolol BID

## 2022-10-21 NOTE — Progress Notes (Signed)
Established patient visit  Patient: Kelly Stephenson   DOB: 05/14/1971   52 y.o. Female  MRN: [id number] Visit Date: 10/21/2022  Today's healthcare provider: Gwyneth Sprout, FNP  Introduced to nurse practitioner role and practice setting.  All questions answered.  Discussed provider/patient relationship and expectations.  Chief Complaint  Patient presents with   Hospitalization Follow-up    Hospital--left side have weakness and anxiety   Subjective    HPI HPI     Hospitalization Follow-up    Additional comments: Hospital--left side have weakness and anxiety      Last edited by Elta Guadeloupe, CMA on 10/21/2022  2:26 PM.      Kelly Stephenson is a 52 y.o. female with history of hypertension but no medications times years, chronic right shoulder pain with chronic Aleve use daily who was admitted on 09/16/2022 from work with sudden onset of left facial droop, LUE and LLE weakness with sensory deficits and problems speaking.  BP at admission was elevated to 10/110 and CT of head done showing acute hemorrhage in right basal ganglia and overlying corona radiata with extension into right lateral, third and fourth ventricles.  CTA head/neck was negative for vascular malformation.  She was started on Cleviprex and transferred to [facility] for management.  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  2D echo showed EF 60 to 65% with no wall abnormality.  Dr. Erlinda Hong felt the stroke was due to uncontrolled hypertension and BP meds added and titrated for better control.  Her lethargy was resolving however she continued to be limited by nausea/vomiting, poor p.o. intake, headaches as well as labile blood pressures.  Therapy was working with patient who continued to be limited by left-sided weakness with sensory deficits, bouts of lethargy as well as cognitive deficits.  CIR was recommended due to functional decline.     Hospital Course: Kelly Stephenson was  admitted to rehab 09/20/2022 for inpatient therapies to consist of PT, ST and OT at least three hours five days a week. Past admission physiatrist, therapy team and rehab RN have worked together to provide customized collaborative inpatient rehab. Blood pressures were monitored on TID basis and medications have been titrated throughout her stay.  As reviewed steady state, with increase in activity she was noted to have orthostatic changes therefore hydralazine and amlodipine were  discontinued and Cozaar was decreased to 25 mg daily.  Blood pressures are controlled on this regimen and patient has been educated extensively on medication compliance.  Hospital stay has been eventful in regards to issues with lability as well as mood.  She was noted to have bouts of lability with pseudobulbar affect and nudexa was added but then discontinued due to side effects.  She has had high levels of anxiety and Lexapro was added and titrated upwards for mood stabilization.  CT of head with has been repeated on 1/13, 1/17 and 1/19 due to concerns of neurological changes and has shown decrease in size of bleed.   She continued to have issues with acute on chronic headaches and Topamax was added but discontinued due to sedating side effects.  Oxycodone was added and has been used on as needed basis to help manage pain.  Magnesium supplement was added due to suboptimal levels.  B12 deficiency was treated with dose of IM B12 on 01/11.  She did develop SIRS/sepsis with tachycardia, elevated blood pressure and temp 102. She was noted to  abnormal  LFTs felt to be possibly due to cholecystitis.  She was started on broad-spectrum antibiotics empirically.  IV fluids added due to AKI.  Abdominal ultrasound was done and question distended gallbladder without biliary duct dilatation.  She reported issues with pain and nausea therefore CT was done showing distended gallbladder without ductal dilatation.  Dr. Raynelle Highland  was consulted for  input and felt that rise in LFTs likely due to hydralazine and/or Lipitor which were discontinued.  He recommended supportive care with monitoring LFTs as these were improving.  Acute hepatitis panel was negative.  She did develop a rash question due to vancomycin and antibiotics were discontinued 1/22 as patient had defervesced and feeling better.   Patient has also had issues with high levels of anxiety and panic attacks.  Klonopin was added however discontinued due to side defects.  On 01/30, she had abrupt onset of breakdown during family education with speech therapy session.  Dr. Lovette Cliche with psychiatry was consulted for input and management and recommended 2 mg Haldol +1 mg Ativan to help combat acute symptoms.  This was effective with patient back to baseline and appropriate by the next day.  She was sent home with 5 pills each of Haldol/Ativan to use in case of recurrent episode and patient and family have been given instructions on psychiatric follow-up after discharge as well as symptoms/reasons to come back to hospital in terms of psych perspective.   Hyponatremia has resolved and abnormal LFTs have greatly improved.  She was noted to prerenal azotemia at discharge and was encouraged to drink at least 6 to 8 glasses of water daily.  Hypokalemia has resolved with adequate supplementation.  Follow-up CBC shows H&H to be within normal limits and reactive leukocytosis has resolved.  Foltx added due to low homocystine levels as well as suboptimal B12 level.  She was noted to have vitamin D deficiency and ergocalciferol was added for supplement.  Sleep-wake disruption has resolved and insomnia has been managed with low-dose melatonin at bedtime.  Her p.o. intake has been improving.  She has made gains during her stay and requires contact-guard to supervision at discharge.  She will continue to receive follow-up home health PT, OT and ST by [facility] home health after discharge.     Rehab course: During  patient's stay in rehab weekly team conferences were held to monitor patient's progress, set goals and discuss barriers to discharge. At admission, patient required mod assist with mobility and max assist with ADL tasks. She exhibited cognitive deficits affecting orientation, attention, memory and problem solving.  She  has had improvement in activity tolerance, balance, postural control as well as ability to compensate for deficits. She has had improvement in functional use LUE  and LLE as well as improvement in awareness. She is able to complete ADL tasks with min assist. She requires CGA  for transfers and to ambulate 100' with RW and cues for gait and safety. She is able to complete cognitive tasks with supervision. Family education has been completed regarding all aspects of safety and care. .    Medications: Outpatient Medications Prior to Visit  Medication Sig   acetaminophen (TYLENOL) 325 MG tablet Take 2 tablets (650 mg total) by mouth every 4 (four) hours as needed for mild pain.   fluticasone (FLONASE) 50 MCG/ACT nasal spray Place 1 spray into both nostrils daily.   haloperidol (HALDOL) 1 MG tablet Take 2 tablets (2 mg total) by mouth as needed for agitation. Take with ativan as needed if  you feel that you are getting ready to breakdown or have severe anxiety attack.   L-Methylfolate-B6-B12 (FOLTANX) 3-35-2 MG TABS Take 1 tablet by mouth daily.   loratadine (CLARITIN) 10 MG tablet Take 1 tablet (10 mg total) by mouth daily.   LORazepam (ATIVAN) 1 MG tablet Take 1 tablet (1 mg total) by mouth as needed. Take with 2 haldol tablets if you get in to severe anxiety/breakdown state.   magnesium oxide (MAG-OX) 400 MG tablet Take 1 tablet (400 mg total) by mouth at bedtime.   melatonin 3 MG TABS tablet Take 1 tablet (3 mg total) by mouth at bedtime.   oxyCODONE (OXY IR/ROXICODONE) 5 MG immediate release tablet Take 1 tablet (5 mg total) by mouth every 8 (eight) hours as needed for severe pain.    pantoprazole (PROTONIX) 40 MG tablet Take 1 tablet (40 mg total) by mouth daily.   [DISCONTINUED] losartan (COZAAR) 25 MG tablet Take 1 tablet (25 mg total) by mouth daily.   [DISCONTINUED] metoprolol tartrate (LOPRESSOR) 50 MG tablet Take 1.5 tablets (75 mg total) by mouth 2 (two) times daily.   [DISCONTINUED] sertraline (ZOLOFT) 50 MG tablet Take 1.5 tablets (75 mg total) by mouth at bedtime.   No facility-administered medications prior to visit.    Review of Systems  Last CBC Lab Results  Component Value Date   WBC 8.1 10/10/2022   HGB 13.8 10/10/2022   HCT 40.3 10/10/2022   MCV 83.6 10/10/2022   MCH 28.6 10/10/2022   RDW 13.2 10/10/2022   PLT 387 [id number]   Last metabolic panel Lab Results  Component Value Date   GLUCOSE 101 (H) 10/13/2022   NA 137 10/13/2022   K 4.2 10/13/2022   CL 104 10/13/2022   CO2 24 10/13/2022   BUN 31 (H) 10/13/2022   CREATININE 1.09 (H) 10/13/2022   GFRNONAA >60 10/13/2022   CALCIUM 9.3 10/13/2022   PROT 7.2 10/10/2022   ALBUMIN 3.6 10/10/2022   LABGLOB 2.6 11/27/2020   AGRATIO 1.7 11/27/2020   BILITOT 0.5 10/10/2022   ALKPHOS 207 (H) 10/10/2022   AST 23 10/10/2022   ALT 74 (H) 10/10/2022   ANIONGAP 9 10/13/2022   Last lipids Lab Results  Component Value Date   CHOL 310 (H) 09/16/2022   HDL 59 09/16/2022   LDLCALC 206 (H) 09/16/2022   TRIG 225 (H) 09/16/2022   CHOLHDL 5.3 09/16/2022   Last hemoglobin A1c Lab Results  Component Value Date   HGBA1C 5.4 09/16/2022   Last thyroid functions Lab Results  Component Value Date   TSH 3.439 10/01/2022   Last vitamin D Lab Results  Component Value Date   VD25OH 6.52 (L) 09/22/2022   Last vitamin B12 and Folate Lab Results  Component Value Date   VITAMINB12 179 (L) 09/21/2022     Objective    BP 132/84   Pulse 87   SpO2 98%   BP Readings from Last 3 Encounters:  10/21/22 132/84  10/13/22 (!) 140/79  09/20/22 (!) 126/59   Wt Readings from Last 3 Encounters:   09/20/22 193 lb 9 oz (87.8 kg)  09/20/22 203 lb 11.3 oz (92.4 kg)  09/16/22 203 lb 11.3 oz (92.4 kg)   SpO2 Readings from Last 3 Encounters:  10/21/22 98%  10/12/22 99%  09/20/22 98%   Physical Exam Constitutional:      General: She is in acute distress.  HENT:     Head: Normocephalic and atraumatic.  Cardiovascular:     Rate and Rhythm:  Normal rate and regular rhythm.     Pulses: Normal pulses.  Pulmonary:     Effort: Pulmonary effort is normal.  Abdominal:     General: Bowel sounds are normal.     Palpations: Abdomen is soft.  Neurological:     Mental Status: She is alert.  Psychiatric:        Attention and Perception: She is inattentive.        Mood and Affect: Mood is anxious. Affect is labile.        Speech: Speech is rapid and pressured and tangential.        Behavior: Behavior is agitated and hyperactive.        Thought Content: Thought content normal. Thought content does not include homicidal or suicidal ideation. Thought content does not include homicidal or suicidal plan.        Judgment: Judgment normal.     No results found for any visits on 10/21/22.  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)    HF appt today; pt presents with complicated course from both inpatient care as well as rehab stay Acutely anxious in office setting, unable to stand for weight, unable to tolerate automatic bp or allow for door to be shut in office exam room Recommend  BP control and neuro (including psych) follow up Referral placed to [facility] per pt's preference given this episode of leaving home and the anxiety which it has provoked  Encouraged to use PRN medications provided previously to assist in feelings of panic Unable to obtain lab samples today d/t acute distress; pt and family decline hospital transfer as pt was stable at home prior to appt today      Relevant Medications   sertraline (ZOLOFT) 100 MG tablet   Other Relevant Orders   Ambulatory referral to [place]   Ambulatory referral to Neurology   Ambulatory referral to Psychiatry   Ambulatory referral to Psychology     Other   Hospital discharge follow-up - Primary   Severe anxiety with panic    Increase zoloft 100 mg; 1 month f/u Encouraged to follow up with psych and neuro      Relevant Medications   sertraline (ZOLOFT) 100 MG tablet   Other Relevant Orders   Ambulatory referral to [place]   Ambulatory referral to Neurology   Ambulatory referral to Psychiatry   Ambulatory referral to Psychology   Return in about 4 weeks (around 11/18/2022) for chonic disease management.     Vonna Kotyk, FNP, have reviewed all documentation for this visit. The documentation on 10/21/22 for the exam, diagnosis, procedures, and orders are all accurate and complete.  Gwyneth Sprout, [place] [phone] (phone) [phone] (fax)  [place]

## 2022-10-21 NOTE — Patient Instructions (Signed)
-  get blood pressure monitor, check 1 time/day and write down; goal <130/<80; HR goal <100 -increase zoloft to 100 mg -use PRN (haldol/ativan) to assist -referral to home health and neuro

## 2022-10-21 NOTE — Assessment & Plan Note (Signed)
Increase zoloft 100 mg; 1 month f/u Encouraged to follow up with psych and neuro

## 2022-10-24 ENCOUNTER — Encounter
Payer: BC Managed Care – PPO | Attending: Physical Medicine and Rehabilitation | Admitting: Physical Medicine and Rehabilitation

## 2022-10-27 ENCOUNTER — Other Ambulatory Visit: Payer: Self-pay | Admitting: Family Medicine

## 2022-10-28 ENCOUNTER — Telehealth: Payer: Self-pay | Admitting: Family Medicine

## 2022-10-28 NOTE — Telephone Encounter (Signed)
[facility]MA paperwork was completed & faxed on 10/27/2022. A copy was left at the front office. Patient was informed.

## 2022-11-10 ENCOUNTER — Other Ambulatory Visit: Payer: Self-pay | Admitting: Family Medicine

## 2022-11-10 DIAGNOSIS — I610 Nontraumatic intracerebral hemorrhage in hemisphere, subcortical: Secondary | ICD-10-CM | POA: Diagnosis not present

## 2022-11-10 DIAGNOSIS — I619 Nontraumatic intracerebral hemorrhage, unspecified: Secondary | ICD-10-CM | POA: Diagnosis not present

## 2022-11-10 DIAGNOSIS — F411 Generalized anxiety disorder: Secondary | ICD-10-CM | POA: Diagnosis not present

## 2022-11-10 NOTE — Telephone Encounter (Signed)
Medication Refill - Medication: losartan (COZAAR) 25 MG tablet [id number]  L-Methylfolate-B6-B12 (FOLTANX) 3-35-2 MG TABS [id number]  pantoprazole (PROTONIX) 40 MG tablet [id number]  metoprolol tartrate (LOPRESSOR) 50 MG tablet [id number]  magnesium oxide (MAG-OX) 400 MG tablet [id number]  melatonin 3 MG TABS tablet [id number]    Has the patient contacted their pharmacy? No. (Agent: If no, request that the patient contact the pharmacy for the refill. If patient does not wish to contact the pharmacy document the reason why and proceed with request.) (Agent: If yes, when and what did the pharmacy advise?)  Preferred Pharmacy (with phone number or street name):  [id number]    Has the patient been seen for an appointment in the last year OR does the patient have an upcoming appointment? Yes.    Agent: Please be advised that RX refills may take up to 3 business days. We ask that you follow-up with your pharmacy.

## 2022-11-10 NOTE — Telephone Encounter (Signed)
Requested medication (s) are due for refill today: no all  Requested medication (s) are on the active medication list: yes  Last refill:  multiple dates  Future visit scheduled: yes  Notes to clinic:  Unable to refill per protocol, last refill by another provider. Routing for review.     Requested Prescriptions  Pending Prescriptions Disp Refills   L-Methylfolate-B6-B12 (FOLTANX) 3-35-2 MG TABS 30 tablet 0    Sig: Take 1 tablet by mouth daily.     There is no refill protocol information for this order     losartan (COZAAR) 25 MG tablet 90 tablet 3    Sig: Take 1 tablet (25 mg total) by mouth daily.     Cardiovascular:  Angiotensin Receptor Blockers Failed - 11/10/2022 10:21 AM      Failed - Cr in normal range and within 180 days    Creatinine, Ser  Date Value Ref Range Status  10/13/2022 1.09 (H) 0.44 - 1.00 mg/dL Final         Passed - K in normal range and within 180 days    Potassium  Date Value Ref Range Status  10/13/2022 4.2 3.5 - 5.1 mmol/L Final         Passed - Patient is not pregnant      Passed - Last BP in normal range    BP Readings from Last 1 Encounters:  10/21/22 132/84         Passed - Valid encounter within last 6 months    Recent Outpatient Visits           2 weeks ago Hospital discharge follow-up   [facility] Tally Joe T, [place]   1 year ago Chest pain, unspecified type   [facility] Birdie Sons, MD   1 year ago Elevated blood pressure reading   [facility] Birdie Sons, MD   2 years ago [place], Dionne Bucy, MD       Future Appointments             In 1 week Fisher, Kirstie Peri, MD [facility]             metoprolol tartrate (LOPRESSOR) 50 MG tablet 270 tablet 3    Sig: Take 1.5 tablets (75 mg total) by mouth 2 (two) times daily.     Cardiovascular:  Beta  Blockers Passed - 11/10/2022 10:21 AM      Passed - Last BP in normal range    BP Readings from Last 1 Encounters:  10/21/22 132/84         Passed - Last Heart Rate in normal range    Pulse Readings from Last 1 Encounters:  10/21/22 87         Passed - Valid encounter within last 6 months    Recent Outpatient Visits           2 weeks ago Hospital discharge follow-up   [facility] Tally Joe T, FNP   1 year ago Chest pain, unspecified type   [facility] Birdie Sons, MD   1 year ago Elevated blood pressure reading   [facility] Birdie Sons, MD   2 years ago [place], Dionne Bucy, MD       Future Appointments  In 1 week Fisher, Kirstie Peri, MD [facility], [facility]             pantoprazole (PROTONIX) 40 MG tablet 30 tablet 0    Sig: Take 1 tablet (40 mg total) by mouth daily.     Gastroenterology: Proton Pump Inhibitors Passed - 11/10/2022 10:21 AM      Passed - Valid encounter within last 12 months    Recent Outpatient Visits           2 weeks ago Hospital discharge follow-up   [facility] Tally Joe T, [place]   1 year ago Chest pain, unspecified type   [facility] Birdie Sons, MD   1 year ago Elevated blood pressure reading   [facility] Birdie Sons, MD   2 years ago [place], Dionne Bucy, MD       Future Appointments             In 1 week Fisher, Kirstie Peri, MD [facility], [facility]             magnesium oxide (MAG-OX) 400 MG tablet 30 tablet 0    Sig: Take 1 tablet (400 mg total) by mouth at bedtime.     Endocrinology:  Minerals - Magnesium Supplementation Failed - 11/10/2022 10:21 AM      Failed - Cr in normal range and  within 360 days    Creatinine, Ser  Date Value Ref Range Status  10/13/2022 1.09 (H) 0.44 - 1.00 mg/dL Final         Passed - Mg Level in normal range and within 360 days    Magnesium  Date Value Ref Range Status  10/06/2022 2.0 1.7 - 2.4 mg/dL Final    Comment:    Performed at [facility], [place] [address], [place], [place] [address]         Passed - Valid encounter within last 12 months    Recent Outpatient Visits           2 weeks ago Hospital discharge follow-up   [facility] Tally Joe T, FNP   1 year ago Chest pain, unspecified type   [facility] Birdie Sons, MD   1 year ago Elevated blood pressure reading   [facility] Birdie Sons, MD   2 years ago [place], MD       Future Appointments             In 1 week Fisher, Kirstie Peri, MD [place], [facility]             melatonin 3 MG TABS tablet 30 tablet 0    Sig: Take 1 tablet (3 mg total) by mouth at bedtime.     Over the Counter:  OTC Passed - 11/10/2022 10:21 AM      Passed - Valid encounter within last 12 months    Recent Outpatient Visits           2 weeks ago Hospital discharge follow-up   [facility] Tally Joe T, FNP   1 year ago Chest pain, unspecified type   [facility] Birdie Sons, MD   1 year ago Elevated blood pressure reading  [place], Donald E, MD   2 years ago [place], Dionne Bucy, MD       Future Appointments             In 1 week Fisher, Kirstie Peri, MD [facility], [facility]

## 2022-11-14 MED ORDER — MELATONIN 3 MG PO TABS: 3.0000 mg | ORAL_TABLET | Freq: Every day | ORAL | 0 refills | Status: DC

## 2022-11-14 MED ORDER — MAGNESIUM OXIDE 400 MG PO TABS: 400.0000 mg | ORAL_TABLET | Freq: Every day | ORAL | 0 refills | Status: AC

## 2022-11-14 MED ORDER — FOLTANX 3-35-2 MG PO TABS: 1.0000 | ORAL_TABLET | Freq: Every day | ORAL | 0 refills | Status: DC

## 2022-11-14 MED ORDER — PANTOPRAZOLE SODIUM 40 MG PO TBEC: 40.0000 mg | DELAYED_RELEASE_TABLET | Freq: Every day | ORAL | 0 refills | Status: DC

## 2022-11-14 NOTE — Telephone Encounter (Signed)
Kelly Stephenson pt's husband is calling to follow up on medication refills.   Pt is requesting a callback with an update.   Please advise.

## 2022-11-21 ENCOUNTER — Ambulatory Visit: Payer: BC Managed Care – PPO | Admitting: Family Medicine

## 2022-11-30 NOTE — Progress Notes (Unsigned)
I,J'ya E Hunter,acting as a scribe for Gwyneth Sprout, FNP.,have documented all relevant documentation on the behalf of Gwyneth Sprout, FNP,as directed by  Gwyneth Sprout, FNP while in the presence of Gwyneth Sprout, FNP.  Established patient visit  Patient: Kelly Stephenson   DOB: 1971/07/25   52 y.o. Female  MRN: [id number] Visit Date: 12/01/2022  Today's healthcare provider: Gwyneth Sprout, FNP  Re Introduced to nurse practitioner role and practice setting.  All questions answered.  Discussed provider/patient relationship and expectations.  Clearance to return to work, BP follow up, labs.  Subjective    HPI  Anxiety, Follow-up  She was last seen for anxiety 2 months ago. Changes made at last visit include Increase zoloft 100 mg; 1 month f/u Encouraged to follow up with psych and neuro.   She reports poor compliance with treatment. She reports good tolerance of treatment. She is not having side effects.   She feels her anxiety is mild and Improved since last visit.  Symptoms: No chest pain No difficulty concentrating  No dizziness No fatigue  No feelings of losing control No insomnia  No irritable No palpitations  No panic attacks No racing thoughts  No shortness of breath No sweating  No tremors/shakes    GAD-7 Results     No data to display          PHQ-9 Scores    12/01/2022    3:37 PM 12/11/2020   11:12 AM 11/27/2020    3:34 PM  PHQ9 SCORE ONLY  PHQ-9 Total Score 4 17 21     ---------------------------------------------------------------------------------------------------  Hypertension, follow-up  BP Readings from Last 3 Encounters:  12/01/22 114/79  10/21/22 132/84  10/13/22 (!) 140/79   Wt Readings from Last 3 Encounters:  12/01/22 176 lb 3.2 oz (79.9 kg)  09/20/22 193 lb 9 oz (87.8 kg)  09/20/22 203 lb 11.3 oz (92.4 kg)     She was last seen for hypertension 2 months ago.  BP at that visit was 132/84. Management since that visit includes Continue  current regimen- goal to check/record 1x/day Losartan 25 mg, 75 mg metoprolol BID.  She reports  good  compliance with treatment.   Outside blood pressures are being measured but not recorded . Symptoms: No chest pain No chest pressure  No palpitations No syncope  No dyspnea No orthopnea  No paroxysmal nocturnal dyspnea No lower extremity edema   Pertinent labs Lab Results  Component Value Date   CHOL 310 (H) 09/16/2022   HDL 59 09/16/2022   LDLCALC 206 (H) 09/16/2022   TRIG 225 (H) 09/16/2022   CHOLHDL 5.3 09/16/2022   Lab Results  Component Value Date   NA 137 10/13/2022   K 4.2 10/13/2022   CREATININE 1.09 (H) 10/13/2022   GFRNONAA >60 10/13/2022   GLUCOSE 101 (H) 10/13/2022   TSH 3.439 10/01/2022     The 10-year ASCVD risk score (Arnett DK, et al., 2019) is: 2.3%  Patient is ready to go back to work, is requesting a note regarding stroke that she had on the job. ---------------------------------------------------------------------------------------------------  Medications: Outpatient Medications Prior to Visit  Medication Sig   acetaminophen (TYLENOL) 325 MG tablet Take 2 tablets (650 mg total) by mouth every 4 (four) hours as needed for mild pain.   fluticasone (FLONASE) 50 MCG/ACT nasal spray Place 1 spray into both nostrils daily.   L-Methylfolate-B6-B12 (FOLTANX) 3-35-2 MG TABS Take 1 tablet by mouth daily.   loratadine (CLARITIN) 10  MG tablet Take 1 tablet (10 mg total) by mouth daily.   losartan (COZAAR) 25 MG tablet Take 1 tablet (25 mg total) by mouth daily.   magnesium oxide (MAG-OX) 400 MG tablet Take 1 tablet (400 mg total) by mouth at bedtime.   melatonin 3 MG TABS tablet Take 1 tablet (3 mg total) by mouth at bedtime.   metoprolol tartrate (LOPRESSOR) 50 MG tablet Take 1.5 tablets (75 mg total) by mouth 2 (two) times daily.   pantoprazole (PROTONIX) 40 MG tablet Take 1 tablet (40 mg total) by mouth daily.   [DISCONTINUED] haloperidol (HALDOL) 1 MG  tablet Take 2 tablets (2 mg total) by mouth as needed for agitation. Take with ativan as needed if you feel that you are getting ready to breakdown or have severe anxiety attack. (Patient not taking: Reported on 12/01/2022)   [DISCONTINUED] LORazepam (ATIVAN) 1 MG tablet Take 1 tablet (1 mg total) by mouth as needed. Take with 2 haldol tablets if you get in to severe anxiety/breakdown state. (Patient not taking: Reported on 12/01/2022)   [DISCONTINUED] oxyCODONE (OXY IR/ROXICODONE) 5 MG immediate release tablet Take 1 tablet (5 mg total) by mouth every 8 (eight) hours as needed for severe pain. (Patient not taking: Reported on 12/01/2022)   [DISCONTINUED] sertraline (ZOLOFT) 100 MG tablet Take 1 tablet (100 mg total) by mouth daily. (Patient not taking: Reported on 12/01/2022)   No facility-administered medications prior to visit.    Review of Systems    Objective    BP 114/79 (BP Location: Right Arm, Patient Position: Sitting, Cuff Size: Large)   Pulse 69   Temp 98.4 F (36.9 C) (Oral)   Resp 13   Ht 5\' 4"  (1.626 m)   Wt 176 lb 3.2 oz (79.9 kg)   SpO2 99%   BMI 30.24 kg/m   Physical Exam Vitals and nursing note reviewed.  Constitutional:      General: She is not in acute distress.    Appearance: Normal appearance. She is obese. She is not ill-appearing, toxic-appearing or diaphoretic.  HENT:     Head: Normocephalic and atraumatic.  Cardiovascular:     Rate and Rhythm: Normal rate and regular rhythm.     Pulses: Normal pulses.     Heart sounds: Normal heart sounds. No murmur heard.    No friction rub. No gallop.  Pulmonary:     Effort: Pulmonary effort is normal. No respiratory distress.     Breath sounds: Normal breath sounds. No stridor. No wheezing, rhonchi or rales.  Chest:     Chest wall: No tenderness.  Musculoskeletal:        General: No swelling, tenderness, deformity or signs of injury. Normal range of motion.     Right lower leg: No edema.     Left lower leg: No  edema.  Skin:    General: Skin is warm and dry.     Capillary Refill: Capillary refill takes less than 2 seconds.     Coloration: Skin is not jaundiced or pale.     Findings: No bruising, erythema, lesion or rash.  Neurological:     General: No focal deficit present.     Mental Status: She is alert and oriented to person, place, and time. Mental status is at baseline.     Cranial Nerves: No cranial nerve deficit.     Sensory: Sensory deficit present.     Motor: Weakness present.     Coordination: Coordination normal.     Gait: Gait  abnormal.     Comments: 4/5 LUE; 3/5 LLL with brace/PFO; use of walker  Psychiatric:        Mood and Affect: Mood normal.        Behavior: Behavior normal.        Thought Content: Thought content normal.        Judgment: Judgment normal.     No results found for any visits on 12/01/22.  Assessment & Plan     Problem List Items Addressed This Visit       Cardiovascular and Mediastinum   Primary hypertension    Chronic, stable Continue Metop 75 mg BID, Losartan at 25 mg Goal <130/<80 Continue lifestyle modification (low salt diet, regular exercise) to assist.      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) - Primary    Chronic, improving Continues to use walker Use of PFO to LLE Reports some improving sensory in LUE Continues to drag L foot at times; does better on tile vs carpet       Relevant Orders   Comprehensive Metabolic Panel (CMET)   Lipid panel     Other   Mixed hyperlipidemia    Chronic, previously elevated with extensive family history Not on medication at this time Recommend LP LDL goal of <70; advised patient medication may be needed      Relevant Orders   Comprehensive Metabolic Panel (CMET)   Lipid panel   Severe anxiety with panic     Chronic, improved Self wean from daily SSRI as well as abortive medications; reports "plan" in place to assist with stressors at work.  Made lifestyle changes since CVA Continue to monitor; PHQ stable at this time      Relevant Orders   Comprehensive Metabolic Panel (CMET)   Lipid panel   Return in about 3 months (around 03/03/2023) for annual examination.     Vonna Kotyk, FNP, have reviewed all documentation for this visit. The documentation on 12/01/22 for the exam, diagnosis, procedures, and orders are all accurate and complete.  Gwyneth Sprout, [place] [phone] (phone) [phone] (fax)  [place]

## 2022-12-01 ENCOUNTER — Ambulatory Visit: Payer: BC Managed Care – PPO | Admitting: Family Medicine

## 2022-12-01 ENCOUNTER — Encounter: Payer: Self-pay | Admitting: Family Medicine

## 2022-12-01 VITALS — BP 114/79 | HR 69 | Temp 98.4°F | Resp 13 | Ht 64.0 in | Wt 176.2 lb

## 2022-12-01 DIAGNOSIS — I10 Essential (primary) hypertension: Secondary | ICD-10-CM

## 2022-12-01 DIAGNOSIS — F410 Panic disorder [episodic paroxysmal anxiety] without agoraphobia: Secondary | ICD-10-CM

## 2022-12-01 DIAGNOSIS — I69254 Hemiplegia and hemiparesis following other nontraumatic intracranial hemorrhage affecting left non-dominant side: Secondary | ICD-10-CM

## 2022-12-01 DIAGNOSIS — E782 Mixed hyperlipidemia: Secondary | ICD-10-CM | POA: Insufficient documentation

## 2022-12-01 NOTE — Assessment & Plan Note (Signed)
Chronic, previously elevated with extensive family history Not on medication at this time Recommend LP LDL goal of <70; advised patient medication may be needed

## 2022-12-01 NOTE — Assessment & Plan Note (Signed)
Chronic, improved Self wean from daily SSRI as well as abortive medications; reports "plan" in place to assist with stressors at work.  Made lifestyle changes since CVA Continue to monitor; PHQ stable at this time

## 2022-12-01 NOTE — Assessment & Plan Note (Signed)
Chronic, improving Continues to use walker Use of PFO to LLE Reports some improving sensory in LUE Continues to drag L foot at times; does better on tile vs carpet

## 2022-12-01 NOTE — Assessment & Plan Note (Signed)
Chronic, stable Continue Metop 75 mg BID, Losartan at 25 mg Goal <130/<80 Continue lifestyle modification (low salt diet, regular exercise) to assist.

## 2022-12-02 ENCOUNTER — Other Ambulatory Visit: Payer: Self-pay | Admitting: Family Medicine

## 2022-12-02 LAB — COMPREHENSIVE METABOLIC PANEL
ALT: 20 IU/L (ref 0–32)
AST: 18 IU/L (ref 0–40)
Albumin/Globulin Ratio: 1.4 (ref 1.2–2.2)
Albumin: 4.3 g/dL (ref 3.8–4.9)
Alkaline Phosphatase: 133 IU/L — ABNORMAL HIGH (ref 44–121)
BUN/Creatinine Ratio: 21 (ref 9–23)
BUN: 20 mg/dL (ref 6–24)
Bilirubin Total: 0.2 mg/dL (ref 0.0–1.2)
CO2: 23 mmol/L (ref 20–29)
Calcium: 9.5 mg/dL (ref 8.7–10.2)
Chloride: 104 mmol/L (ref 96–106)
Creatinine, Ser: 0.95 mg/dL (ref 0.57–1.00)
Globulin, Total: 3.1 g/dL (ref 1.5–4.5)
Glucose: 101 mg/dL — ABNORMAL HIGH (ref 70–99)
Potassium: 4.4 mmol/L (ref 3.5–5.2)
Sodium: 141 mmol/L (ref 134–144)
Total Protein: 7.4 g/dL (ref 6.0–8.5)
eGFR: 73 mL/min/{1.73_m2} (ref >59)

## 2022-12-02 LAB — LIPID PANEL
Chol/HDL Ratio: 6.9 ratio — ABNORMAL HIGH (ref 0.0–4.4)
Cholesterol, Total: 311 mg/dL — ABNORMAL HIGH (ref 100–199)
HDL: 45 mg/dL (ref >39)
LDL Chol Calc (NIH): 209 mg/dL — ABNORMAL HIGH (ref 0–99)
Triglycerides: 282 mg/dL — ABNORMAL HIGH (ref 0–149)
VLDL Cholesterol Cal: 57 mg/dL — ABNORMAL HIGH (ref 5–40)

## 2022-12-02 MED ORDER — ROSUVASTATIN CALCIUM 20 MG PO TABS: 20.0000 mg | ORAL_TABLET | Freq: Every day | ORAL | 3 refills | Status: DC

## 2022-12-02 NOTE — Progress Notes (Signed)
Labs continue to normalize; recommend treatment for cholesterol. LDL is improved however, remains elevated. Goal is <70 and currently at 209 mg/dL. Will call in crestor 20 mg. We can repeat again in 3 months.

## 2022-12-07 ENCOUNTER — Other Ambulatory Visit: Payer: Self-pay | Admitting: Family Medicine

## 2022-12-08 DIAGNOSIS — M5416 Radiculopathy, lumbar region: Secondary | ICD-10-CM | POA: Diagnosis not present

## 2022-12-08 DIAGNOSIS — M5136 Other intervertebral disc degeneration, lumbar region: Secondary | ICD-10-CM | POA: Diagnosis not present

## 2022-12-08 DIAGNOSIS — M9902 Segmental and somatic dysfunction of thoracic region: Secondary | ICD-10-CM | POA: Diagnosis not present

## 2022-12-08 DIAGNOSIS — M9903 Segmental and somatic dysfunction of lumbar region: Secondary | ICD-10-CM | POA: Diagnosis not present

## 2022-12-11 DIAGNOSIS — F411 Generalized anxiety disorder: Secondary | ICD-10-CM | POA: Diagnosis not present

## 2022-12-11 DIAGNOSIS — I610 Nontraumatic intracerebral hemorrhage in hemisphere, subcortical: Secondary | ICD-10-CM | POA: Diagnosis not present

## 2022-12-11 DIAGNOSIS — I619 Nontraumatic intracerebral hemorrhage, unspecified: Secondary | ICD-10-CM | POA: Diagnosis not present

## 2023-01-10 DIAGNOSIS — I610 Nontraumatic intracerebral hemorrhage in hemisphere, subcortical: Secondary | ICD-10-CM | POA: Diagnosis not present

## 2023-01-10 DIAGNOSIS — F411 Generalized anxiety disorder: Secondary | ICD-10-CM | POA: Diagnosis not present

## 2023-01-10 DIAGNOSIS — I619 Nontraumatic intracerebral hemorrhage, unspecified: Secondary | ICD-10-CM | POA: Diagnosis not present

## 2023-01-20 ENCOUNTER — Other Ambulatory Visit: Payer: Self-pay | Admitting: Family Medicine

## 2023-01-20 DIAGNOSIS — F410 Panic disorder [episodic paroxysmal anxiety] without agoraphobia: Secondary | ICD-10-CM

## 2023-01-20 DIAGNOSIS — I69254 Hemiplegia and hemiparesis following other nontraumatic intracranial hemorrhage affecting left non-dominant side: Secondary | ICD-10-CM

## 2023-01-20 NOTE — Telephone Encounter (Signed)
Requested Prescriptions  Refused Prescriptions Disp Refills   sertraline (ZOLOFT) 100 MG tablet [Pharmacy Med Name: SERTRALINE HCL 100 MG TABLET] 90 tablet 0    Sig: TAKE 1 TABLET BY MOUTH EVERY DAY     Psychiatry:  Antidepressants - SSRI - sertraline Passed - 01/20/2023  2:14 AM      Passed - AST in normal range and within 360 days    AST  Date Value Ref Range Status  12/01/2022 18 0 - 40 IU/L Final         Passed - ALT in normal range and within 360 days    ALT  Date Value Ref Range Status  12/01/2022 20 0 - 32 IU/L Final         Passed - Completed PHQ-2 or PHQ-9 in the last 360 days      Passed - Valid encounter within last 6 months    Recent Outpatient Visits           1 month ago 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 Jacky Kindle, FNP   3 months ago Hospital discharge follow-up   [facility] Merita Norton T, FNP   2 years ago Chest pain, unspecified type   [facility] Malva Limes, MD   2 years ago Elevated blood pressure reading   [facility] Malva Limes, MD   2 years ago COVID-19   [facility] [place], Marzella Schlein, MD       Future Appointments             In 1 month Suzie Portela, Daryl Eastern, FNP [facility], [facility]

## 2023-01-24 ENCOUNTER — Other Ambulatory Visit: Payer: Self-pay | Admitting: Family Medicine

## 2023-01-24 DIAGNOSIS — F410 Panic disorder [episodic paroxysmal anxiety] without agoraphobia: Secondary | ICD-10-CM

## 2023-01-24 DIAGNOSIS — I69254 Hemiplegia and hemiparesis following other nontraumatic intracranial hemorrhage affecting left non-dominant side: Secondary | ICD-10-CM

## 2023-01-24 NOTE — Telephone Encounter (Signed)
Med dc'd 12/01/22 by Merita Norton FNP  Requested Prescriptions  Refused Prescriptions Disp Refills   sertraline (ZOLOFT) 100 MG tablet [Pharmacy Med Name: SERTRALINE HCL 100 MG TABLET] 90 tablet 0    Sig: TAKE 1 TABLET BY MOUTH EVERY DAY     Psychiatry:  Antidepressants - SSRI - sertraline Passed - 01/24/2023 10:53 AM      Passed - AST in normal range and within 360 days    AST  Date Value Ref Range Status  12/01/2022 18 0 - 40 IU/L Final         Passed - ALT in normal range and within 360 days    ALT  Date Value Ref Range Status  12/01/2022 20 0 - 32 IU/L Final         Passed - Completed PHQ-2 or PHQ-9 in the last 360 days      Passed - Valid encounter within last 6 months    Recent Outpatient Visits           1 month ago 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 Merita Norton T, FNP   2 years ago Chest pain, unspecified type   [facility] Malva Limes, MD   2 years ago Elevated blood pressure reading   [facility] Malva Limes, MD   2 years ago COVID-19   [facility] [place], Marzella Schlein, MD       Future Appointments             In 1 month Suzie Portela, Daryl Eastern, FNP [facility], [facility]

## 2023-01-26 ENCOUNTER — Encounter: Payer: Self-pay | Admitting: Family Medicine

## 2023-01-26 ENCOUNTER — Ambulatory Visit: Payer: BC Managed Care – PPO | Admitting: Family Medicine

## 2023-01-26 VITALS — BP 126/97 | HR 70 | Temp 97.5°F | Resp 18 | Wt 170.0 lb

## 2023-01-26 DIAGNOSIS — R7309 Other abnormal glucose: Secondary | ICD-10-CM

## 2023-01-26 DIAGNOSIS — I69254 Hemiplegia and hemiparesis following other nontraumatic intracranial hemorrhage affecting left non-dominant side: Secondary | ICD-10-CM | POA: Diagnosis not present

## 2023-01-26 DIAGNOSIS — R7401 Elevation of levels of liver transaminase levels: Secondary | ICD-10-CM

## 2023-01-26 DIAGNOSIS — I10 Essential (primary) hypertension: Secondary | ICD-10-CM | POA: Diagnosis not present

## 2023-01-26 DIAGNOSIS — E782 Mixed hyperlipidemia: Secondary | ICD-10-CM

## 2023-01-26 NOTE — Progress Notes (Signed)
Established patient visit   I,Roshena L Chambers,acting as a scribe for Jacky Kindle, FNP.,have documented all relevant documentation on the behalf of Jacky Kindle, FNP,as directed by  Jacky Kindle, FNP while in the presence of Jacky Kindle, FNP.    Patient: Kelly Stephenson   DOB: [birth year]   52 y.o. Female  MRN: [id number] Visit Date: 01/26/2023  Today's healthcare provider: Jacky Kindle, FNP  Re Introduced to nurse practitioner role and practice setting.  All questions answered.  Discussed provider/patient relationship and expectations.  Chief Complaint  Patient presents with   Medical Management of Chronic Issues   Subjective    HPI  Here to review medications.   Medications: Outpatient Medications Prior to Visit  Medication Sig   acetaminophen (TYLENOL) 325 MG tablet Take 2 tablets (650 mg total) by mouth every 4 (four) hours as needed for mild pain.   losartan (COZAAR) 25 MG tablet Take 1 tablet (25 mg total) by mouth daily.   magnesium oxide (MAG-OX) 400 MG tablet Take 1 tablet (400 mg total) by mouth at bedtime. (Patient taking differently: Take 500 mg by mouth at bedtime.)   melatonin 3 MG TABS tablet Take 1 tablet (3 mg total) by mouth at bedtime.   metoprolol tartrate (LOPRESSOR) 50 MG tablet Take 1.5 tablets (75 mg total) by mouth 2 (two) times daily.   pantoprazole (PROTONIX) 40 MG tablet TAKE 1 TABLET BY MOUTH EVERY DAY   rosuvastatin (CRESTOR) 20 MG tablet Take 1 tablet (20 mg total) by mouth daily.   sertraline (ZOLOFT) 100 MG tablet Take 100 mg by mouth daily.   fluticasone (FLONASE) 50 MCG/ACT nasal spray Place 1 spray into both nostrils daily. (Patient not taking: Reported on 01/26/2023)   loratadine (CLARITIN) 10 MG tablet Take 1 tablet (10 mg total) by mouth daily. (Patient not taking: Reported on 01/26/2023)   [DISCONTINUED] L-Methylfolate-B6-B12 (FOLTANX) 3-35-2 MG TABS Take 1 tablet by mouth daily. (Patient not taking: Reported on 01/26/2023)   No  facility-administered medications prior to visit.    Review of Systems     Objective    BP (!) 126/97 (BP Location: Right Arm, Cuff Size: Large)   Pulse 70   Temp (!) 97.5 F (36.4 C) (Oral)   Resp 18   Wt 170 lb (77.1 kg)   SpO2 98% Comment: room air  BMI 29.18 kg/m    Physical Exam Vitals and nursing note reviewed.  Constitutional:      General: She is not in acute distress.    Appearance: Normal appearance. She is overweight. She is not ill-appearing, toxic-appearing or diaphoretic.  HENT:     Head: Normocephalic and atraumatic.  Cardiovascular:     Rate and Rhythm: Normal rate and regular rhythm.     Pulses: Normal pulses.     Heart sounds: Normal heart sounds. No murmur heard.    No friction rub. No gallop.  Pulmonary:     Effort: Pulmonary effort is normal. No respiratory distress.     Breath sounds: Normal breath sounds. No stridor. No wheezing, rhonchi or rales.  Chest:     Chest wall: No tenderness.  Abdominal:     Palpations: Abdomen is soft.  Musculoskeletal:        General: No swelling, tenderness, deformity or signs of injury. Normal range of motion.     Right lower leg: No edema.     Left lower leg: No edema.  Skin:  General: Skin is warm and dry.     Capillary Refill: Capillary refill takes less than 2 seconds.     Coloration: Skin is not jaundiced or pale.     Findings: No bruising, erythema, lesion or rash.  Neurological:     General: No focal deficit present.     Mental Status: She is alert and oriented to person, place, and time. Mental status is at baseline.     Cranial Nerves: No cranial nerve deficit.     Sensory: No sensory deficit.     Motor: No weakness.     Coordination: Coordination normal.     Gait: Gait abnormal.     Comments: Weakness remains s/p CVA; however, ambulating without walker/cane   Psychiatric:        Mood and Affect: Mood is anxious.        Behavior: Behavior normal.        Thought Content: Thought content normal.         Judgment: Judgment normal.   No results found for any visits on 01/26/23.  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 Smaller meal size, adding snacks. Choosing water as drink of choice and increasing purposeful exercise.       Relevant Orders   Hemoglobin A1c   Mixed hyperlipidemia - Primary    Chronic, now on statin Repeat LP Recommend LDL 55 on crestor 20 without complaints       Relevant Orders   Lipid panel   Transaminitis    Acute, stable Repeat CMP      Relevant Orders   Comprehensive Metabolic Panel (CMET)   Return if symptoms worsen or fail to improve.     Leilani Merl, FNP, have reviewed all documentation for this visit. The documentation on 01/26/23 for the exam, diagnosis, procedures, and orders are all accurate and complete.  Jacky Kindle, FNP  [facility] [phone] (phone) [phone] (fax)  [facility]

## 2023-01-26 NOTE — Assessment & Plan Note (Signed)
Check A1c with hx of elevated glucose Continue to recommend balanced, lower carb meals. Smaller meal size, adding snacks. Choosing water as drink of choice and increasing purposeful exercise.

## 2023-01-26 NOTE — Assessment & Plan Note (Signed)
Chronic, now on statin Repeat LP Recommend LDL 55 on crestor 20 without complaints

## 2023-01-26 NOTE — Assessment & Plan Note (Signed)
Acute, stable Repeat CMP 

## 2023-01-26 NOTE — Assessment & Plan Note (Signed)
Chronic, borderline Will grab labs today Discussed addition of hctz vs losartan increase from 25 to 50 Defer change in metop 50 bid Repeat labs in setting of stress/dizziness

## 2023-01-26 NOTE — Assessment & Plan Note (Signed)
Weakness remains s/p CVA; however, ambulating without walker/cane  Will repeat CBC and CMP as well as electrolytes since coming off some medications with LP given new start of statin

## 2023-01-27 ENCOUNTER — Other Ambulatory Visit: Payer: Self-pay | Admitting: Family Medicine

## 2023-01-27 LAB — COMPREHENSIVE METABOLIC PANEL
Albumin: 4.5 g/dL (ref 3.8–4.9)
CO2: 21 mmol/L (ref 20–29)
Globulin, Total: 2.6 g/dL (ref 1.5–4.5)
Total Protein: 7.1 g/dL (ref 6.0–8.5)
eGFR: 81 mL/min/{1.73_m2} (ref >59)

## 2023-01-27 LAB — LIPID PANEL
Cholesterol, Total: 179 mg/dL (ref 100–199)
LDL Chol Calc (NIH): 105 mg/dL — ABNORMAL HIGH (ref 0–99)
Triglycerides: 124 mg/dL (ref 0–149)

## 2023-01-27 LAB — VITAMIN B6

## 2023-01-27 LAB — MAGNESIUM: Magnesium: 2 mg/dL (ref 1.6–2.3)

## 2023-01-27 LAB — VITAMIN D 25 HYDROXY (VIT D DEFICIENCY, FRACTURES): Vit D, 25-Hydroxy: 27.6 ng/mL — ABNORMAL LOW (ref 30.0–100.0)

## 2023-01-27 MED ORDER — VITAMIN D (ERGOCALCIFEROL) 1.25 MG (50000 UNIT) PO CAPS
50000.0000 [IU] | ORAL_CAPSULE | ORAL | 0 refills | Status: DC
Start: 2023-01-27 — End: 2023-03-01

## 2023-01-27 MED ORDER — ROSUVASTATIN CALCIUM 40 MG PO TABS: 40.0000 mg | ORAL_TABLET | Freq: Every day | ORAL | 3 refills | Status: DC

## 2023-01-27 MED ORDER — LOSARTAN POTASSIUM-HCTZ 50-12.5 MG PO TABS: 1.0000 | ORAL_TABLET | Freq: Every day | ORAL | 3 refills | Status: DC

## 2023-01-27 NOTE — Progress Notes (Signed)
LDL is half of what it was 1 month ago; this is very reassuring. However, goal LDL following a stroke is 16-10. Recommend increased to Crestor 40 mg; ok to use 2- of current tablet to finish this Rx.  Vit D is improved as well; continue supplement as it remains low.  B-12 has stabilized; b6 pending  All other labs are normal/stable.

## 2023-02-01 LAB — B12 AND FOLATE PANEL
Folate: 17.2 ng/mL (ref >3.0)
Vitamin B-12: 501 pg/mL (ref 232–1245)

## 2023-02-01 LAB — COMPREHENSIVE METABOLIC PANEL
ALT: 31 IU/L (ref 0–32)
AST: 21 IU/L (ref 0–40)
Albumin/Globulin Ratio: 1.7 (ref 1.2–2.2)
Alkaline Phosphatase: 115 IU/L (ref 44–121)
BUN/Creatinine Ratio: 25 — ABNORMAL HIGH (ref 9–23)
BUN: 22 mg/dL (ref 6–24)
Bilirubin Total: 0.3 mg/dL (ref 0.0–1.2)
Calcium: 9.8 mg/dL (ref 8.7–10.2)
Chloride: 103 mmol/L (ref 96–106)
Creatinine, Ser: 0.87 mg/dL (ref 0.57–1.00)
Glucose: 90 mg/dL (ref 70–99)
Potassium: 4.1 mmol/L (ref 3.5–5.2)
Sodium: 141 mmol/L (ref 134–144)

## 2023-02-01 LAB — CBC
Hematocrit: 41.3 % (ref 34.0–46.6)
Hemoglobin: 13.6 g/dL (ref 11.1–15.9)
MCH: 28.5 pg (ref 26.6–33.0)
MCHC: 32.9 g/dL (ref 31.5–35.7)
MCV: 86 fL (ref 79–97)
Platelets: 258 10*3/uL (ref 150–450)
RBC: 4.78 x10E6/uL (ref 3.77–5.28)
RDW: 13.8 % (ref 11.7–15.4)
WBC: 7.6 10*3/uL (ref 3.4–10.8)

## 2023-02-01 LAB — HEMOGLOBIN A1C
Est. average glucose Bld gHb Est-mCnc: 108 mg/dL
Hgb A1c MFr Bld: 5.4 % (ref 4.8–5.6)

## 2023-02-01 LAB — LIPID PANEL
Chol/HDL Ratio: 3.4 ratio (ref 0.0–4.4)
HDL: 52 mg/dL (ref >39)
VLDL Cholesterol Cal: 22 mg/dL (ref 5–40)

## 2023-02-10 DIAGNOSIS — I619 Nontraumatic intracerebral hemorrhage, unspecified: Secondary | ICD-10-CM | POA: Diagnosis not present

## 2023-02-10 DIAGNOSIS — F411 Generalized anxiety disorder: Secondary | ICD-10-CM | POA: Diagnosis not present

## 2023-02-10 DIAGNOSIS — I610 Nontraumatic intracerebral hemorrhage in hemisphere, subcortical: Secondary | ICD-10-CM | POA: Diagnosis not present

## 2023-03-01 ENCOUNTER — Encounter: Payer: Self-pay | Admitting: Family Medicine

## 2023-03-01 ENCOUNTER — Ambulatory Visit (INDEPENDENT_AMBULATORY_CARE_PROVIDER_SITE_OTHER): Payer: BC Managed Care – PPO | Admitting: Family Medicine

## 2023-03-01 VITALS — BP 112/63 | HR 72 | Ht 64.0 in | Wt 171.3 lb

## 2023-03-01 DIAGNOSIS — Z1211 Encounter for screening for malignant neoplasm of colon: Secondary | ICD-10-CM | POA: Diagnosis not present

## 2023-03-01 DIAGNOSIS — I69254 Hemiplegia and hemiparesis following other nontraumatic intracranial hemorrhage affecting left non-dominant side: Secondary | ICD-10-CM

## 2023-03-01 DIAGNOSIS — Z0000 Encounter for general adult medical examination without abnormal findings: Secondary | ICD-10-CM

## 2023-03-01 DIAGNOSIS — E782 Mixed hyperlipidemia: Secondary | ICD-10-CM

## 2023-03-01 DIAGNOSIS — Z1231 Encounter for screening mammogram for malignant neoplasm of breast: Secondary | ICD-10-CM

## 2023-03-01 DIAGNOSIS — I10 Essential (primary) hypertension: Secondary | ICD-10-CM | POA: Diagnosis not present

## 2023-03-01 DIAGNOSIS — F410 Panic disorder [episodic paroxysmal anxiety] without agoraphobia: Secondary | ICD-10-CM

## 2023-03-01 MED ORDER — PANTOPRAZOLE SODIUM 40 MG PO TBEC: 40.0000 mg | DELAYED_RELEASE_TABLET | Freq: Every day | ORAL | 3 refills | Status: DC

## 2023-03-01 MED ORDER — SERTRALINE HCL 50 MG PO TABS: 150.0000 mg | ORAL_TABLET | Freq: Every day | ORAL | 3 refills | Status: DC

## 2023-03-01 MED ORDER — VITAMIN D (ERGOCALCIFEROL) 1.25 MG (50000 UNIT) PO CAPS: 50000.0000 [IU] | ORAL_CAPSULE | ORAL | 0 refills | Status: DC

## 2023-03-01 MED ORDER — METOPROLOL SUCCINATE ER 200 MG PO TB24: 200.0000 mg | ORAL_TABLET | Freq: Every day | ORAL | 3 refills | Status: DC

## 2023-03-01 MED ORDER — EZETIMIBE 10 MG PO TABS
10.0000 mg | ORAL_TABLET | Freq: Every day | ORAL | 3 refills | Status: DC
Start: 2023-03-01 — End: 2023-11-08

## 2023-03-01 NOTE — Assessment & Plan Note (Signed)
Due for screening for mammogram, denies breast concerns, provided with phone number to call and schedule appointment for mammogram. Encouraged to repeat breast cancer screening every 1-2 years.  

## 2023-03-01 NOTE — Assessment & Plan Note (Signed)
Chronic, uncontrolled Defer repeat LP at this time On crestor 40 mg now LDL goal of 55-70 Add Zetia 10 mg

## 2023-03-01 NOTE — Assessment & Plan Note (Signed)
Chronic, at goal Switch from Metop 75 mg to Toprol XR 200 mg Goal 129/79

## 2023-03-01 NOTE — Patient Instructions (Addendum)
Please call and schedule your mammogram:  [facility]  [address], [address] [facility] [place],  [place]  [address] Get Driving Directions Main: [phone]  Sunday:Closed Monday:7:20 AM - 5:00 PM Tuesday:7:20 AM - 5:00 PM Wednesday:7:20 AM - 5:00 PM Thursday:7:20 AM - 5:00 PM Friday:7:20 AM - 4:30 PM Saturday:Closed  DRUG CHANGES: -Increase zoloft from 75 (1.5 tablet)--> 100 (1 tablet)--->NOW 150 mg 3 tablets -Change in Metop from 75 mg (1.5 tab) to 200 XR TOPROL -Refilled Vit D, Refilled Protonix -ADD zetia to assist LDL/bad cholesterol goal of 55-70

## 2023-03-01 NOTE — Assessment & Plan Note (Signed)
Denies concerns; willing to complete cologuard at this time

## 2023-03-01 NOTE — Assessment & Plan Note (Signed)
Due for mammogram; no complaints Things to do to keep yourself healthy  - Exercise at least 30-45 minutes a day, 3-4 days a week.  - Eat a low-fat diet with lots of fruits and vegetables, up to 7-9 servings per day.  - Seatbelts can save your life. Wear them always.  - Smoke detectors on every level of your home, check batteries every year.  - Eye Doctor - have an eye exam every 1-2 years  - Safe sex - if you may be exposed to STDs, use a condom.  - Alcohol -  If you drink, do it moderately, less than 2 drinks per day.  - Health Care Power of Attorney. Choose someone to speak for you if you are not able.  - Depression is common in our stressful world.If you're feeling down or losing interest in things you normally enjoy, please come in for a visit.  - Violence - If anyone is threatening or hurting you, please call immediately.

## 2023-03-01 NOTE — Progress Notes (Signed)
Complete physical exam  Patient: Kelly Stephenson   DOB: 1971-05-16   52 y.o. Female  MRN: [id number] Visit Date: 03/01/2023  Today's healthcare provider: Jacky Kindle, FNP  Re Introduced to nurse practitioner role and practice setting.  All questions answered.  Discussed provider/patient relationship and expectations.  Subjective    Kelly Stephenson is a 52 y.o. female who presents today for a complete physical exam.  She reports consuming a  DASH  diet when able too.  The patient reports she does PT once a week for 45 minutes, as well as walking daily for 30-45 minutes and continues some of her PT exercises at home for at least 15 minutes.  She generally feels fairly well. She reports sleeping well. She does not have additional problems to discuss today.   HPI  -Tetanus Vaccine: declined -Shingles Vaccine: declined -Colonoscopy: declined -Pap Smear: Last seen at [facility]. Ok with records being requested. Reports she will return to new office for updated screening  Past Medical History:  Diagnosis Date   Abdominal pain 10/01/2022   Anxiety    Chronic right shoulder pain    Diarrhea 10/01/2022   Gastrocnemius strain, left    did outpatient Tx w/ improvement   Hypertension    Hypertensive urgency 10/01/2022   Insomnia    Sepsis (HCC) 10/01/2022   Past Surgical History:  Procedure Laterality Date   ABDOMINAL HYSTERECTOMY     CESAREAN SECTION     Social History   Socioeconomic History   Marital status: Married    Spouse name: Freida Busman   Number of children: 3   Years of education: Not on file   Highest education level: Not on file  Occupational History   Not on file  Tobacco Use   Smoking status: Never   Smokeless tobacco: Never  Vaping Use   Vaping Use: Never used  Substance and Sexual Activity   Alcohol use: Yes    Alcohol/week: 1.0 standard drink of alcohol    Types: 1 Glasses of wine per week   Drug use: No   Sexual activity: Not on file  Other Topics Concern    Not on file  Social History Narrative   3 children   Social Determinants of Health   Financial Resource Strain: Not on file  Food Insecurity: No Food Insecurity (09/16/2022)   Hunger Vital Sign    Worried About Running Out of Food in the Last Year: Never true    Ran Out of Food in the Last Year: Never true  Transportation Needs: No Transportation Needs (09/16/2022)   PRAPARE - Administrator, Civil Service (Medical): No    Lack of Transportation (Non-Medical): No  Physical Activity: Not on file  Stress: Not on file  Social Connections: Not on file  Intimate Partner Violence: Not At Risk (09/16/2022)   Humiliation, Afraid, Rape, and Kick questionnaire    Fear of Current or Ex-Partner: No    Emotionally Abused: No    Physically Abused: No    Sexually Abused: No   Family Status  Relation Name Status   Mother  Alive   Father  Deceased at age 2       heart attack   Sister  Alive   Sister  Alive   Brother  Alive   MGF  (Not Specified)   Daughter  Alive   Daughter  Alive   Son  Alive   Mat Aunt  (Not Specified)   Nurse, mental health  (  Not Specified)   Oneal Grout  (Not Specified)   Other  (Not Specified)   Family History  Problem Relation Age of Onset   High blood pressure Mother    Osteoporosis Mother    High blood pressure Father    Heart Problems Father    Diabetes Father    Hypertension Father    Stroke Father    Heart attack Father    High blood pressure Sister    Depression Sister    Diabetes Sister    Hypertension Sister    High blood pressure Brother    Stroke Maternal Grandfather    Hypertension Maternal Aunt    Obesity Maternal Aunt    Hypertension Maternal Uncle    Kidney disease Maternal Uncle    Glaucoma Maternal Uncle    Stroke Paternal Uncle    Stroke Other    No Known Allergies  Patient Care Team: Malva Limes, MD as PCP - General (Family Medicine)   Medications: Outpatient Medications Prior to Visit  Medication Sig   acetaminophen  (TYLENOL) 325 MG tablet Take 2 tablets (650 mg total) by mouth every 4 (four) hours as needed for mild pain.   fluticasone (FLONASE) 50 MCG/ACT nasal spray Place 1 spray into both nostrils daily.   loratadine (CLARITIN) 10 MG tablet Take 1 tablet (10 mg total) by mouth daily.   losartan-hydrochlorothiazide (HYZAAR) 50-12.5 MG tablet Take 1 tablet by mouth daily.   magnesium oxide (MAG-OX) 400 MG tablet Take 1 tablet (400 mg total) by mouth at bedtime. (Patient taking differently: Take 500 mg by mouth at bedtime.)   melatonin 3 MG TABS tablet Take 1 tablet (3 mg total) by mouth at bedtime.   rosuvastatin (CRESTOR) 40 MG tablet Take 1 tablet (40 mg total) by mouth daily.   [DISCONTINUED] metoprolol tartrate (LOPRESSOR) 50 MG tablet Take 1.5 tablets (75 mg total) by mouth 2 (two) times daily.   [DISCONTINUED] pantoprazole (PROTONIX) 40 MG tablet TAKE 1 TABLET BY MOUTH EVERY DAY   [DISCONTINUED] sertraline (ZOLOFT) 100 MG tablet Take 100 mg by mouth daily.   [DISCONTINUED] Vitamin D, Ergocalciferol, (DRISDOL) 1.25 MG (50000 UNIT) CAPS capsule Take 1 capsule (50,000 Units total) by mouth every 7 (seven) days.   No facility-administered medications prior to visit.    Review of Systems  Constitutional: Negative.   HENT: Negative.    Eyes: Negative.   Respiratory: Negative.    Cardiovascular: Negative.   Gastrointestinal: Negative.   Endocrine: Negative.   Genitourinary: Negative.   Musculoskeletal: Negative.   Skin: Negative.   Allergic/Immunologic: Negative.   Neurological: Negative.   Hematological: Negative.   Psychiatric/Behavioral: Negative.      Last CBC Lab Results  Component Value Date   WBC 7.6 01/26/2023   HGB 13.6 01/26/2023   HCT 41.3 01/26/2023   MCV 86 01/26/2023   MCH 28.5 01/26/2023   RDW 13.8 01/26/2023   PLT 258 01/26/2023   Last metabolic panel Lab Results  Component Value Date   GLUCOSE 90 01/26/2023   NA 141 01/26/2023   K 4.1 01/26/2023   CL 103  01/26/2023   CO2 21 01/26/2023   BUN 22 01/26/2023   CREATININE 0.87 01/26/2023   EGFR 81 01/26/2023   CALCIUM 9.8 01/26/2023   PROT 7.1 01/26/2023   ALBUMIN 4.5 01/26/2023   LABGLOB 2.6 01/26/2023   AGRATIO 1.7 01/26/2023   BILITOT 0.3 01/26/2023   ALKPHOS 115 01/26/2023   AST 21 01/26/2023   ALT 31 01/26/2023  ANIONGAP 9 10/13/2022   Last lipids Lab Results  Component Value Date   CHOL 179 01/26/2023   HDL 52 01/26/2023   LDLCALC 105 (H) 01/26/2023   TRIG 124 01/26/2023   CHOLHDL 3.4 01/26/2023   Last hemoglobin A1c Lab Results  Component Value Date   HGBA1C 5.4 01/26/2023   Last thyroid functions Lab Results  Component Value Date   TSH 3.439 10/01/2022   Last vitamin D Lab Results  Component Value Date   VD25OH 27.6 (L) 01/26/2023   Last vitamin B12 and Folate Lab Results  Component Value Date   VITAMINB12 501 01/26/2023   FOLATE 17.2 01/26/2023      Objective    BP 112/63 (BP Location: Left Arm, Patient Position: Sitting, Cuff Size: Normal)   Pulse 72   Ht 5\' 4"  (1.626 m)   Wt 171 lb 4.8 oz (77.7 kg)   SpO2 98%   BMI 29.40 kg/m   BP Readings from Last 3 Encounters:  03/01/23 112/63  01/26/23 (!) 126/97  12/01/22 114/79   Wt Readings from Last 3 Encounters:  03/01/23 171 lb 4.8 oz (77.7 kg)  01/26/23 170 lb (77.1 kg)  12/01/22 176 lb 3.2 oz (79.9 kg)   SpO2 Readings from Last 3 Encounters:  03/01/23 98%  01/26/23 98%  12/01/22 99%   Physical Exam Vitals and nursing note reviewed.  Constitutional:      General: She is awake. She is not in acute distress.    Appearance: Normal appearance. She is well-developed, well-groomed and overweight. She is not ill-appearing, toxic-appearing or diaphoretic.  HENT:     Head: Normocephalic and atraumatic.     Jaw: There is normal jaw occlusion. No trismus, tenderness, swelling or pain on movement.     Right Ear: Hearing, tympanic membrane, ear canal and external ear normal. There is no impacted  cerumen.     Left Ear: Hearing, tympanic membrane, ear canal and external ear normal. There is no impacted cerumen.     Nose: Nose normal. No congestion or rhinorrhea.     Right Turbinates: Not enlarged, swollen or pale.     Left Turbinates: Not enlarged, swollen or pale.     Right Sinus: No maxillary sinus tenderness or frontal sinus tenderness.     Left Sinus: No maxillary sinus tenderness or frontal sinus tenderness.     Mouth/Throat:     Lips: Pink.     Mouth: Mucous membranes are moist. No injury.     Tongue: No lesions.     Pharynx: Oropharynx is clear. Uvula midline. No pharyngeal swelling, oropharyngeal exudate, posterior oropharyngeal erythema or uvula swelling.     Tonsils: No tonsillar exudate or tonsillar abscesses.  Eyes:     General: Lids are normal. Lids are everted, no foreign bodies appreciated. Vision grossly intact. Gaze aligned appropriately. No allergic shiner or visual field deficit.       Right eye: No discharge.        Left eye: No discharge.     Extraocular Movements: Extraocular movements intact.     Conjunctiva/sclera: Conjunctivae normal.     Right eye: Right conjunctiva is not injected. No exudate.    Left eye: Left conjunctiva is not injected. No exudate.    Pupils: Pupils are equal, round, and reactive to light.  Neck:     Thyroid: No thyroid mass, thyromegaly or thyroid tenderness.     Vascular: No carotid bruit.     Trachea: Trachea normal.  Cardiovascular:  Rate and Rhythm: Normal rate and regular rhythm.     Pulses: Normal pulses.          Carotid pulses are 2+ on the right side and 2+ on the left side.      Radial pulses are 2+ on the right side and 2+ on the left side.       Dorsalis pedis pulses are 2+ on the right side and 2+ on the left side.       Posterior tibial pulses are 2+ on the right side and 2+ on the left side.     Heart sounds: Normal heart sounds, S1 normal and S2 normal. No murmur heard.    No friction rub. No gallop.   Pulmonary:     Effort: Pulmonary effort is normal. No respiratory distress.     Breath sounds: Normal breath sounds and air entry. No stridor. No wheezing, rhonchi or rales.  Chest:     Chest wall: No tenderness.  Abdominal:     General: Abdomen is flat. Bowel sounds are normal. There is no distension.     Palpations: Abdomen is soft. There is no mass.     Tenderness: There is no abdominal tenderness. There is no right CVA tenderness, left CVA tenderness, guarding or rebound.     Hernia: No hernia is present.  Genitourinary:    Comments: Exam deferred; denies complaints Musculoskeletal:        General: No swelling, tenderness, deformity or signs of injury. Normal range of motion.     Cervical back: Full passive range of motion without pain, normal range of motion and neck supple. No edema, rigidity or tenderness. No muscular tenderness.     Right lower leg: No edema.     Left lower leg: No edema.  Lymphadenopathy:     Cervical: No cervical adenopathy.     Right cervical: No superficial, deep or posterior cervical adenopathy.    Left cervical: No superficial, deep or posterior cervical adenopathy.  Skin:    General: Skin is warm and dry.     Capillary Refill: Capillary refill takes less than 2 seconds.     Coloration: Skin is not jaundiced or pale.     Findings: No bruising, erythema, lesion or rash.  Neurological:     General: No focal deficit present.     Mental Status: She is alert and oriented to person, place, and time. Mental status is at baseline.     GCS: GCS eye subscore is 4. GCS verbal subscore is 5. GCS motor subscore is 6.     Sensory: Sensation is intact. No sensory deficit.     Motor: Motor function is intact. No weakness.     Coordination: Coordination is intact. Coordination normal.     Gait: Gait is intact. Gait normal.  Psychiatric:        Attention and Perception: Attention and perception normal.        Mood and Affect: Mood and affect normal.        Speech:  Speech normal.        Behavior: Behavior normal. Behavior is cooperative.        Thought Content: Thought content normal.        Cognition and Memory: Cognition and memory normal.        Judgment: Judgment normal.     Last depression screening scores    01/26/2023    1:40 PM 12/01/2022    3:37 PM 12/11/2020   11:12 AM  PHQ 2/9 Scores  PHQ - 2 Score 0 0 4  PHQ- 9 Score 10 4 17    Last fall risk screening    12/01/2022    3:37 PM  Fall Risk   Falls in the past year? 1  Number falls in past yr: 1  Injury with Fall? 1  Risk for fall due to : Impaired balance/gait   Last Audit-C alcohol use screening    12/01/2022    3:38 PM  Alcohol Use Disorder Test (AUDIT)  1. How often do you have a drink containing alcohol? 0  3. How often do you have six or more drinks on one occasion? 0   A score of 3 or more in women, and 4 or more in men indicates increased risk for alcohol abuse, EXCEPT if all of the points are from question 1   No results found for any visits on 03/01/23.  Assessment & Plan    Routine Health Maintenance and Physical Exam  Exercise Activities and Dietary recommendations  Goals   None     Immunization History  Administered Date(s) Administered   Influenza,inj,Quad PF,6+ Mos 07/20/2015   Td 09/12/2004    Health Maintenance  Topic Date Due   COVID-19 Vaccine (1) Never done   PAP SMEAR-Modifier  Never done   DTaP/Tdap/Td (2 - Tdap) 09/12/2014   Colonoscopy  Never done   MAMMOGRAM  Never done   Zoster Vaccines- Shingrix (1 of 2) Never done   INFLUENZA VACCINE  04/13/2023   Hepatitis C Screening  Completed   HIV Screening  Completed   HPV VACCINES  Aged Out    Discussed health benefits of physical activity, and encouraged her to engage in regular exercise appropriate for her age and condition.  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  - Eat a low-fat diet with lots of fruits and vegetables, up to 7-9 servings per day.  - Seatbelts can save your life. Wear them always.  - Smoke detectors on every level of your home, check batteries every year.  - Eye Doctor - have an eye exam every 1-2 years  - Safe sex - if you may be exposed to STDs, use a condom.  - Alcohol -  If you drink, do it moderately, less than 2 drinks per day.  - Health Care Power of Attorney. Choose someone to speak for you if you are not able.  - Depression is common in our stressful world.If you're feeling down or losing interest in things you normally enjoy, please come in for a visit.  - Violence - If anyone is threatening or hurting you, please call immediately.       Relevant Orders   MM 3D SCREENING MAMMOGRAM BILATERAL BREAST  Cologuard   Mixed hyperlipidemia    Chronic, uncontrolled Defer repeat LP at this time On crestor 40 mg now LDL goal of 55-70 Add Zetia 10 mg      Relevant Medications   ezetimibe (ZETIA) 10 MG tablet   metoprolol (TOPROL-XL) 200 MG 24 hr tablet   Screen for colon cancer    Denies concerns; willing to complete cologuard at this time      Relevant Orders   Cologuard   Screening mammogram for breast cancer    Due for screening for mammogram, denies breast concerns, provided with phone number to call and schedule appointment for mammogram. Encouraged to repeat breast cancer screening every 1-2  years.       Relevant Orders   MM 3D SCREENING MAMMOGRAM BILATERAL BREAST   Severe anxiety with panic    Chronic, variable Wishes to increase to zoloft 150 mg  Encourage 6-8 week follow up      Relevant Medications   sertraline (ZOLOFT) 50 MG tablet   Return in about 2 months (around 05/01/2023) for chonic disease management.    Leilani Merl, FNP, have reviewed all documentation for this visit. The documentation on 03/01/23 for the exam, diagnosis, procedures, and orders are all accurate and complete.  Jacky Kindle, FNP  [facility] [phone] (phone) [phone] (fax)  [facility]

## 2023-03-01 NOTE — Assessment & Plan Note (Signed)
Chronic, variable Wishes to increase to zoloft 150 mg  Encourage 6-8 week follow up

## 2023-03-01 NOTE — Assessment & Plan Note (Signed)
Chronic, improving Working with PT and walking for exercise

## 2023-03-12 DIAGNOSIS — F411 Generalized anxiety disorder: Secondary | ICD-10-CM | POA: Diagnosis not present

## 2023-03-12 DIAGNOSIS — I610 Nontraumatic intracerebral hemorrhage in hemisphere, subcortical: Secondary | ICD-10-CM | POA: Diagnosis not present

## 2023-03-12 DIAGNOSIS — I619 Nontraumatic intracerebral hemorrhage, unspecified: Secondary | ICD-10-CM | POA: Diagnosis not present

## 2023-03-23 ENCOUNTER — Other Ambulatory Visit: Payer: Self-pay | Admitting: Family Medicine

## 2023-03-24 MED ORDER — MELATONIN 3 MG PO TABS: 3.0000 mg | ORAL_TABLET | Freq: Every day | ORAL | 0 refills | Status: AC

## 2023-03-31 ENCOUNTER — Other Ambulatory Visit: Payer: Self-pay | Admitting: Family Medicine

## 2023-04-12 DIAGNOSIS — I610 Nontraumatic intracerebral hemorrhage in hemisphere, subcortical: Secondary | ICD-10-CM | POA: Diagnosis not present

## 2023-04-12 DIAGNOSIS — F411 Generalized anxiety disorder: Secondary | ICD-10-CM | POA: Diagnosis not present

## 2023-04-12 DIAGNOSIS — I619 Nontraumatic intracerebral hemorrhage, unspecified: Secondary | ICD-10-CM | POA: Diagnosis not present

## 2023-04-17 ENCOUNTER — Other Ambulatory Visit: Payer: Self-pay | Admitting: Family Medicine

## 2023-05-01 ENCOUNTER — Ambulatory Visit (INDEPENDENT_AMBULATORY_CARE_PROVIDER_SITE_OTHER): Payer: BC Managed Care – PPO | Admitting: Family Medicine

## 2023-05-01 DIAGNOSIS — Z91199 Patient's noncompliance with other medical treatment and regimen due to unspecified reason: Secondary | ICD-10-CM

## 2023-05-01 NOTE — Progress Notes (Unsigned)
Patient was not seen for appt d/t no call, no show, or late arrival >10 mins past appt time.   Elise T Payne, FNP  [facility] [address] [place], [place] [address] [phone] (phone) [phone] (fax) [facility]  

## 2023-05-13 DIAGNOSIS — F411 Generalized anxiety disorder: Secondary | ICD-10-CM | POA: Diagnosis not present

## 2023-05-13 DIAGNOSIS — I610 Nontraumatic intracerebral hemorrhage in hemisphere, subcortical: Secondary | ICD-10-CM | POA: Diagnosis not present

## 2023-05-13 DIAGNOSIS — I619 Nontraumatic intracerebral hemorrhage, unspecified: Secondary | ICD-10-CM | POA: Diagnosis not present

## 2023-06-12 DIAGNOSIS — I610 Nontraumatic intracerebral hemorrhage in hemisphere, subcortical: Secondary | ICD-10-CM | POA: Diagnosis not present

## 2023-06-12 DIAGNOSIS — F411 Generalized anxiety disorder: Secondary | ICD-10-CM | POA: Diagnosis not present

## 2023-06-12 DIAGNOSIS — I619 Nontraumatic intracerebral hemorrhage, unspecified: Secondary | ICD-10-CM | POA: Diagnosis not present

## 2023-07-12 ENCOUNTER — Other Ambulatory Visit: Payer: Self-pay | Admitting: Family Medicine

## 2023-07-12 DIAGNOSIS — E559 Vitamin D deficiency, unspecified: Secondary | ICD-10-CM | POA: Insufficient documentation

## 2023-07-12 MED ORDER — VITAMIN D (ERGOCALCIFEROL) 1.25 MG (50000 UNIT) PO CAPS: 50000.0000 [IU] | ORAL_CAPSULE | ORAL | 3 refills | Status: DC

## 2023-09-21 ENCOUNTER — Telehealth: Payer: Self-pay | Admitting: Family Medicine

## 2023-09-21 NOTE — Telephone Encounter (Signed)
 Addressed in [employer] [facility]

## 2023-09-21 NOTE — Telephone Encounter (Signed)
 Medication Refill -  Most Recent Primary Care Visit:  Provider: PAYNE, ELISE T  Department: [facility]  Visit Type: PHYSICAL  Date: 03/01/2023  Medication: losartan -hydrochlorothiazide (HYZAAR) 50-12.5 MG tablet   Has the patient contacted their pharmacy? Yes   Is this the correct pharmacy for this prescription? Yes  This is the patient's preferred pharmacy:  [id number] [place], [place] - [address] [place] [address] Phone: [phone] Fax: [phone]   Has the prescription been filled recently? Yes  Is the patient out of the medication? Yes  Has the patient been seen for an appointment in the last year OR does the patient have an upcoming appointment? Yes  Can we respond through MyChart? Yes  Agent: Please be advised that Rx refills may take up to 3 business days. We ask that you follow-up with your pharmacy.

## 2023-10-03 ENCOUNTER — Telehealth: Payer: Self-pay | Admitting: Family Medicine

## 2023-10-03 NOTE — Telephone Encounter (Signed)
Disp Refills Start End   pantoprazole (PROTONIX) 40 MG tablet 90 tablet 3 03/01/2023 --   Sig - Route: Take 1 tablet (40 mg total) by mouth daily. - Oral   Sent to pharmacy as: pantoprazole (PROTONIX) 40 MG tablet   E-Prescribing Status: Receipt confirmed by pharmacy (03/01/2023  3:56 PM EDT)     Has yr supply not needing until June

## 2023-10-03 NOTE — Telephone Encounter (Signed)
[facility] refill request for the following medications:  pantoprazole (PROTONIX) 40 MG tablet   Please advise.  

## 2023-10-05 NOTE — Telephone Encounter (Signed)
Received a [facility] from [facility] requesting a 90 day supply of this medication.  I do not see that it was sent in and the encounter was closed.  Please advise

## 2023-10-06 NOTE — Telephone Encounter (Signed)
Has yr supply not needing until June - PEC note below.

## 2023-11-08 ENCOUNTER — Other Ambulatory Visit: Payer: Self-pay | Admitting: Family Medicine

## 2023-11-08 MED ORDER — EZETIMIBE 10 MG PO TABS: 10.0000 mg | ORAL_TABLET | Freq: Every day | ORAL | 0 refills | Status: DC

## 2023-11-08 NOTE — Telephone Encounter (Signed)
 [facility] faxed refill request for the following medications:   magnesium oxide (MAG-OX) 400 MG tablet    losartan (COZAAR) 25 MG tablet    ezetimibe (ZETIA) 10 MG tablet     Please advise.

## 2023-11-08 NOTE — Telephone Encounter (Signed)
 LMTCB ok for PEC see to schedule patient an appointment and to verify Blood pressure medication.  LOV: 03/01/2023 Patient needs an appointment with another provider in the office.  Pharmacy requesting Losartan 25 mg Current Med list we have:losartan-hydrochlorothiazide (HYZAAR) 50-12.5 MG tablet to replaced Losartan 25 mg from 01/27/2023

## 2023-11-13 ENCOUNTER — Encounter: Payer: Self-pay | Admitting: Physician Assistant

## 2023-11-13 ENCOUNTER — Ambulatory Visit: Payer: BC Managed Care – PPO | Admitting: Physician Assistant

## 2023-11-13 VITALS — BP 110/72 | HR 67 | Resp 16 | Ht 64.0 in | Wt 168.0 lb

## 2023-11-13 DIAGNOSIS — I10 Essential (primary) hypertension: Secondary | ICD-10-CM | POA: Diagnosis not present

## 2023-11-13 DIAGNOSIS — E782 Mixed hyperlipidemia: Secondary | ICD-10-CM

## 2023-11-13 DIAGNOSIS — I6930 Unspecified sequelae of cerebral infarction: Secondary | ICD-10-CM

## 2023-11-13 DIAGNOSIS — F410 Panic disorder [episodic paroxysmal anxiety] without agoraphobia: Secondary | ICD-10-CM

## 2023-11-13 DIAGNOSIS — F339 Major depressive disorder, recurrent, unspecified: Secondary | ICD-10-CM

## 2023-11-13 DIAGNOSIS — I69254 Hemiplegia and hemiparesis following other nontraumatic intracranial hemorrhage affecting left non-dominant side: Secondary | ICD-10-CM | POA: Diagnosis not present

## 2023-11-13 DIAGNOSIS — R5383 Other fatigue: Secondary | ICD-10-CM

## 2023-11-13 DIAGNOSIS — R42 Dizziness and giddiness: Secondary | ICD-10-CM

## 2023-11-13 DIAGNOSIS — I69392 Facial weakness following cerebral infarction: Secondary | ICD-10-CM

## 2023-11-13 DIAGNOSIS — R413 Other amnesia: Secondary | ICD-10-CM

## 2023-11-13 NOTE — Progress Notes (Signed)
 Established patient visit  Patient: Kelly Stephenson   DOB: 01-Feb-1971   53 y.o. Female  MRN: [id number] Visit Date: 11/13/2023  Today's healthcare provider: Debera Lat, PA-C   Chief Complaint  Patient presents with   Fatigue    Medication refill Losartan. Follow up   Subjective      Discussed the use of AI scribe software for clinical note transcription with the patient, who gave verbal consent to proceed.  History of Present Illness   The patient, with a history of stroke, presents with balance issues and frequent falls over the past six months. She describes a feeling of disconnect between her brain and her left leg, leading to unexpected falls. The patient also reports episodes of dizziness, particularly when changing positions. She has been monitoring her blood pressure at home, which has been generally good. However, she has noticed occasional elevations in the diastolic number.  In addition to these symptoms, the patient has been experiencing short-term memory lapses. She describes instances where she forgets ongoing tasks, such as being on a phone call. The patient also reports a change in her vision, describing everything as appearing blurry.  The patient has been on medication for depression, which has remained at a consistent dosage. She expresses concern about her current state, feeling that she is not in the right spot and that something is not right. She also mentions a history of depression and having had to quit her job due to her current condition.           11/13/2023    4:01 PM 01/26/2023    1:40 PM 12/01/2022    3:37 PM  Depression screen PHQ 2/9  Decreased Interest 2 0 0  Down, Depressed, Hopeless 2 0 0  PHQ - 2 Score 4 0 0  Altered sleeping 2 3 3   Tired, decreased energy 3 3 1   Change in appetite 0 0 0  Feeling bad or failure about yourself  2 0 0  Trouble concentrating 3 3 0  Moving slowly or fidgety/restless 3 1 0  Suicidal thoughts  0 0  PHQ-9 Score  17 10 4   Difficult doing work/chores Very difficult Extremely dIfficult Not difficult at all      11/13/2023    4:02 PM  GAD 7 : Generalized Anxiety Score  Nervous, Anxious, on Edge 3  Control/stop worrying 2  Worry too much - different things 3  Trouble relaxing 2  Restless 0  Easily annoyed or irritable 2  Afraid - awful might happen 0  Total GAD 7 Score 12  Anxiety Difficulty Very difficult    Medications: Outpatient Medications Prior to Visit  Medication Sig   acetaminophen (TYLENOL) 325 MG tablet Take 2 tablets (650 mg total) by mouth every 4 (four) hours as needed for mild pain.   ezetimibe (ZETIA) 10 MG tablet Take 1 tablet (10 mg total) by mouth daily.   fluticasone (FLONASE) 50 MCG/ACT nasal spray Place 1 spray into both nostrils daily.   loratadine (CLARITIN) 10 MG tablet Take 1 tablet (10 mg total) by mouth daily.   losartan-hydrochlorothiazide (HYZAAR) 50-12.5 MG tablet Take 1 tablet by mouth daily.   magnesium oxide (MAG-OX) 400 MG tablet Take 1 tablet (400 mg total) by mouth at bedtime. (Patient taking differently: Take 500 mg by mouth at bedtime.)   melatonin 3 MG TABS tablet Take 1 tablet (3 mg total) by mouth at bedtime.   metoprolol (TOPROL-XL) 200 MG 24 hr tablet Take 1 tablet (200  mg total) by mouth daily.   pantoprazole (PROTONIX) 40 MG tablet Take 1 tablet (40 mg total) by mouth daily.   rosuvastatin (CRESTOR) 40 MG tablet Take 1 tablet (40 mg total) by mouth daily.   sertraline (ZOLOFT) 50 MG tablet Take 3 tablets (150 mg total) by mouth daily.   Vitamin D, Ergocalciferol, (DRISDOL) 1.25 MG (50000 UNIT) CAPS capsule Take 1 capsule (50,000 Units total) by mouth every 7 (seven) days.   No facility-administered medications prior to visit.    Review of Systems All negative Except see HPI       Objective    BP 110/72 (BP Location: Left Arm, Patient Position: Sitting, Cuff Size: Normal)   Pulse 67   Resp 16   Ht 5\' 4"  (1.626 m)   Wt 168 lb (76.2 kg)    SpO2 100%   BMI 28.84 kg/m     Physical Exam Vitals reviewed.  Constitutional:      General: She is not in acute distress.    Appearance: Normal appearance. She is well-developed. She is not diaphoretic.  HENT:     Head: Normocephalic and atraumatic.  Eyes:     General: No scleral icterus.    Conjunctiva/sclera: Conjunctivae normal.  Neck:     Thyroid: No thyromegaly.  Cardiovascular:     Rate and Rhythm: Normal rate and regular rhythm.     Pulses: Normal pulses.     Heart sounds: Normal heart sounds. No murmur heard. Pulmonary:     Effort: Pulmonary effort is normal. No respiratory distress.     Breath sounds: Normal breath sounds. No wheezing, rhonchi or rales.  Musculoskeletal:     Cervical back: Neck supple.     Right lower leg: No edema.     Left lower leg: No edema.  Lymphadenopathy:     Cervical: No cervical adenopathy.  Skin:    General: Skin is warm and dry.     Findings: No rash.  Neurological:     Mental Status: She is alert and oriented to person, place, and time. Mental status is at baseline.  Psychiatric:        Mood and Affect: Mood normal.        Behavior: Behavior normal.      No results found for any visits on 11/13/23.      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 Will follow-up  Severe anxiety with panic Chronic and stable.    11/13/2023    4:02 PM  GAD 7 : Generalized Anxiety Score  Nervous, Anxious, on Edge 3  Control/stop worrying 2  Worry too much - different things 3  Trouble relaxing 2  Restless 0  Easily annoyed or irritable 2  Afraid - awful might happen 0  Total GAD 7 Score 12  Anxiety Difficulty Very difficult   Continue zoloft 150mg  Relaxation/mindfulness  techniques were recommended. Psychodynamic psychotherapy might be considered : patient discovering and verbalizing unconscious conflicts Will follow-up  Depression Chronic    11/13/2023    4:01 PM 01/26/2023    1:40 PM 12/01/2022    3:37 PM  PHQ9 SCORE ONLY  PHQ-9 Total Score 17 10 4    Taking Zoloft 150 mg. Difficulty functioning and job loss may relate to depression and post-stroke sequelae. - Continue Zoloft 150  mg. - Consider mental health referral if symptoms persist or worsen.    Will follow-up  Dizziness - Ambulatory referral to Neurology - Ambulatory Referral to Neuro Rehab - Lipid panel - Hemoglobin A1c - TSH - Comprehensive metabolic panel - CBC with Differential/Platelet  Other fatigue - Ambulatory referral to Neurology - Ambulatory Referral to Neuro Rehab - Lipid panel - Hemoglobin A1c - TSH - Comprehensive metabolic panel - CBC with Differential/Platelet  Memory problem - Ambulatory referral to Neurology - Ambulatory Referral to Neuro Rehab  Post-stroke sequelae Stroke was a year ago Ongoing balance issues, dizziness, memory lapses, and left-sided weakness post-stroke. Lack of rehabilitation may contribute to symptom persistence. Neurology referral and neurorehabilitation prioritized. Weakness/numbness in the left lower extremity and left arm - Refer to neurology in [place] for evaluation and management. - Initiate neurorehabilitation for balance, dizziness, and left-sided weakness. - Perform blood work for electrolytes, kidney function, and vitamin levels. Will follow-up  Dizziness Dizziness may relate to post-stroke sequelae, blood pressure changes, or dehydration. Home blood pressure readings generally good, further monitoring needed. - Measure blood pressure and heart rate twice daily for two weeks. - Schedule follow-up with PCP to review data. - Increase water intake to 8-10 glasses daily.  Orders Placed This Encounter  Procedures   Lipid panel     Has the patient fasted?:   Yes   Hemoglobin A1c   TSH   Comprehensive metabolic panel    Has the patient fasted?:   Yes   CBC with Differential/Platelet   Ambulatory referral to Neurology    Referral Priority:   Routine    Referral Type:   Consultation    Referral Reason:   Specialty Services Required    Requested Specialty:   Neurology    Number of Visits Requested:   1   Ambulatory Referral to Neuro Rehab    Referral Priority:   Routine    Referral Type:   Consultation    Number of Visits Requested:   1    Return in about 2 weeks (around 11/27/2023) for BP f/u.   The patient was advised to call back or seek an in-person evaluation if the symptoms worsen or if the condition fails to improve as anticipated.  I discussed the assessment and treatment plan with the patient. The patient was provided an opportunity to ask questions and all were answered. The patient agreed with the plan and demonstrated an understanding of the instructions.  I, Debera Lat, PA-C have reviewed all documentation for this visit. The documentation on 11/13/2023  for the exam, diagnosis, procedures, and orders are all accurate and complete.  Debera Lat, [facility], MMS [facility] [phone] (phone) [phone] (fax)  [facility]

## 2023-11-14 DIAGNOSIS — I69392 Facial weakness following cerebral infarction: Secondary | ICD-10-CM | POA: Insufficient documentation

## 2023-11-20 ENCOUNTER — Other Ambulatory Visit: Payer: Self-pay | Admitting: Family Medicine

## 2023-11-20 ENCOUNTER — Telehealth: Payer: Self-pay | Admitting: Family Medicine

## 2023-11-20 ENCOUNTER — Ambulatory Visit: Payer: BC Managed Care – PPO | Admitting: Family Medicine

## 2023-11-20 MED ORDER — LOSARTAN POTASSIUM-HCTZ 50-12.5 MG PO TABS: 1.0000 | ORAL_TABLET | Freq: Every day | ORAL | 3 refills | Status: AC

## 2023-11-20 MED ORDER — PANTOPRAZOLE SODIUM 40 MG PO TBEC: 40.0000 mg | DELAYED_RELEASE_TABLET | Freq: Every day | ORAL | 3 refills | Status: AC

## 2023-11-20 NOTE — Telephone Encounter (Signed)
 Copied from CRM [id number]. Topic: Clinical - Medication Refill >> Nov 20, 2023 11:05 AM Higinio Roger wrote: Most Recent Primary Care Visit:  Provider: Debera Lat  Department: [facility]  Visit Type: OFFICE VISIT  Date: 11/13/2023  Medication: losartan-hydrochlorothiazide (HYZAAR) 50-12.5 MG tablet   Has the patient contacted their pharmacy? Yes (Agent: If no, request that the patient contact the pharmacy for the refill. If patient does not wish to contact the pharmacy document the reason why and proceed with request.) (Agent: If yes, when and what did the pharmacy advise?) Refill expired  Is this the correct pharmacy for this prescription? Yes If no, delete pharmacy and type the correct one.  This is the patient's preferred pharmacy:  [id number] [place], [place] - [address] [place] [address] Phone: [phone] Fax: [phone]  Has the prescription been filled recently? Yes  Is the patient out of the medication? Yes  Has the patient been seen for an appointment in the last year OR does the patient have an upcoming appointment? Yes  Can we respond through MyChart? Yes  Agent: Please be advised that Rx refills may take up to 3 business days. We ask that you follow-up with your pharmacy.

## 2023-11-20 NOTE — Telephone Encounter (Signed)
[facility] refill request for the following medications:  pantoprazole (PROTONIX) 40 MG tablet   Please advise.  

## 2023-12-04 ENCOUNTER — Ambulatory Visit: Admitting: Family Medicine

## 2023-12-04 ENCOUNTER — Encounter: Payer: Self-pay | Admitting: Family Medicine

## 2023-12-04 VITALS — BP 109/76 | HR 66 | Temp 98.1°F | Resp 16 | Ht 64.0 in | Wt 170.3 lb

## 2023-12-04 DIAGNOSIS — I10 Essential (primary) hypertension: Secondary | ICD-10-CM

## 2023-12-04 DIAGNOSIS — E538 Deficiency of other specified B group vitamins: Secondary | ICD-10-CM | POA: Diagnosis not present

## 2023-12-04 DIAGNOSIS — R413 Other amnesia: Secondary | ICD-10-CM

## 2023-12-04 DIAGNOSIS — F339 Major depressive disorder, recurrent, unspecified: Secondary | ICD-10-CM

## 2023-12-04 DIAGNOSIS — I6930 Unspecified sequelae of cerebral infarction: Secondary | ICD-10-CM | POA: Diagnosis not present

## 2023-12-04 DIAGNOSIS — E559 Vitamin D deficiency, unspecified: Secondary | ICD-10-CM

## 2023-12-04 DIAGNOSIS — E782 Mixed hyperlipidemia: Secondary | ICD-10-CM

## 2023-12-04 NOTE — Progress Notes (Signed)
 Established patient visit   Patient: Kelly Stephenson   DOB: 23-Aug-1971   53 y.o. Female  MRN: [id number] Visit Date: 12/04/2023  Today's healthcare provider: Mila Merry, MD   Chief Complaint  Patient presents with   Medical Management of Chronic Issues    Follow-up BP, average reading 120's/70-80's Patient would like to go over her medications.   Subjective    Discussed the use of AI scribe software for clinical note transcription with the patient, who gave verbal consent to proceed.  History of Present Illness   Kelly Stephenson is a 53 year old female with a history of hemorrhagic stroke who presents for evaluation of her current health status and disability qualification.  She experienced a hemorrhagic stroke in January of the previous year. Initially, she attempted to return to work on a part-time basis through the summer but eventually had to relinquish her position due to ongoing symptoms. Her condition fluctuates, with days of significant fatigue and instability. On days of exhaustion, she is unable to get out of bed, particularly if she overexerts herself. She no longer drives, as her driver's license lapsed, and she has chosen not to renew it at this time. She is currently in the process of applying for disability benefits.  She experiences significant fatigue and instability, describing her condition as 'up and down.' She monitors her blood pressure daily using a digital device and reports stable readings. She attributes some of her fatigue to inadequate water intake. She takes losartan and metoprolol for blood pressure management.  Her diet has improved significantly, focusing on fruits, vegetables, smoothies, yogurt, and nuts, with minimal eating out. She believes these changes have positively impacted her health.  She takes sertraline for anxiety and recalls a significant anxiety episode during her recovery, which she describes as a 'meltdown.' She reports  occasional episodes of increased anxiety and physical symptoms such as chest pain.  Her husband assists with managing her medications using an AM/PM pillbox. She takes supplements including B12 and vitamin D, although the current doses are unknown.       Medications: Outpatient Medications Prior to Visit  Medication Sig   acetaminophen (TYLENOL) 325 MG tablet Take 2 tablets (650 mg total) by mouth every 4 (four) hours as needed for mild pain.   Cyanocobalamin (B-12 PO) Take 1 tablet by mouth daily.   ezetimibe (ZETIA) 10 MG tablet Take 1 tablet (10 mg total) by mouth daily.   fluticasone (FLONASE) 50 MCG/ACT nasal spray Place 1 spray into both nostrils daily.   loratadine (CLARITIN) 10 MG tablet Take 1 tablet (10 mg total) by mouth daily.   losartan-hydrochlorothiazide (HYZAAR) 50-12.5 MG tablet Take 1 tablet by mouth daily.   magnesium oxide (MAG-OX) 400 MG tablet Take 1 tablet (400 mg total) by mouth at bedtime. (Patient taking differently: Take 500 mg by mouth at bedtime.)   melatonin 3 MG TABS tablet Take 1 tablet (3 mg total) by mouth at bedtime.   metoprolol (TOPROL-XL) 200 MG 24 hr tablet Take 1 tablet (200 mg total) by mouth daily.   pantoprazole (PROTONIX) 40 MG tablet Take 1 tablet (40 mg total) by mouth daily.   rosuvastatin (CRESTOR) 40 MG tablet Take 1 tablet (40 mg total) by mouth daily.   sertraline (ZOLOFT) 50 MG tablet Take 3 tablets (150 mg total) by mouth daily.   Vitamin D, Ergocalciferol, (DRISDOL) 1.25 MG (50000 UNIT) CAPS capsule Take 1 capsule (50,000 Units total) by mouth every 7 (  seven) days.   No facility-administered medications prior to visit.   Review of Systems     Objective    BP 109/76 (BP Location: Right Arm, Patient Position: Sitting, Cuff Size: Large)   Pulse 66   Temp 98.1 F (36.7 C) (Oral)   Resp 16   Ht 5\' 4"  (1.626 m)   Wt 170 lb 4.8 oz (77.2 kg)   SpO2 98%   BMI 29.23 kg/m   Physical Exam   General appearance: Well developed, well  nourished female, cooperative and in no acute distress Head: Normocephalic, without obvious abnormality, atraumatic Respiratory: Respirations even and unlabored, normal respiratory rate Extremities: All extremities are intact.  Skin: Skin color, texture, turgor normal. No rashes seen  Psych: Appropriate mood and affect. Neurologic: Mental status: Alert, oriented to person, place, and time, thought content appropriate.    Assessment & Plan       Hemorrhagic Stroke history Ongoing unsteadiness and fatigue post-stroke. No neurologist follow-up. Stress and hypertension were contributing factors. - Refer to neurologist for evaluation and management. - Encourage use of assistive devices for stability. - Monitor blood pressure.  Hypertension Well-controlled with losartan-hctz and metoprolol. Dehydration may contribute to fatigue. - Continue losartan and metoprolol. - Encourage adequate hydration. - Monitor blood pressure regularly.  Hyperlipidemia Cholesterol managed with rosuvastatin and ezetimibe and dietary changes. - Continue current cholesterol management regimen. - Order blood work to assess cholesterol levels.  Anxiety Managed with sertraline. Occasional increased anxiety and physical symptoms. - Continue sertraline. - Consider referral to mental health professional.  Vitamin B12 and D Deficiency Managed with supplements. Uncertain B12 dosage. - Check vitamin B12 and D levels with blood work. - Confirm dosage of B12 supplement.    No follow-ups on file.      Mila Merry, MD  [facility] [phone] (phone) [phone] (fax)  [facility]

## 2023-12-05 LAB — TSH: TSH: 1.72 u[IU]/mL (ref 0.450–4.500)

## 2023-12-05 LAB — COMPREHENSIVE METABOLIC PANEL
ALT: 78 IU/L — ABNORMAL HIGH (ref 0–32)
AST: 46 IU/L — ABNORMAL HIGH (ref 0–40)
Albumin: 4.5 g/dL (ref 3.8–4.9)
Alkaline Phosphatase: 133 IU/L — ABNORMAL HIGH (ref 44–121)
BUN/Creatinine Ratio: 26 — ABNORMAL HIGH (ref 9–23)
BUN: 24 mg/dL (ref 6–24)
Bilirubin Total: 0.2 mg/dL (ref 0.0–1.2)
CO2: 24 mmol/L (ref 20–29)
Calcium: 9.7 mg/dL (ref 8.7–10.2)
Chloride: 102 mmol/L (ref 96–106)
Creatinine, Ser: 0.92 mg/dL (ref 0.57–1.00)
Globulin, Total: 2.4 g/dL (ref 1.5–4.5)
Glucose: 92 mg/dL (ref 70–99)
Potassium: 3.9 mmol/L (ref 3.5–5.2)
Sodium: 141 mmol/L (ref 134–144)
Total Protein: 6.9 g/dL (ref 6.0–8.5)
eGFR: 75 mL/min/{1.73_m2} (ref >59)

## 2023-12-05 LAB — LIPID PANEL
Chol/HDL Ratio: 3 ratio (ref 0.0–4.4)
Cholesterol, Total: 170 mg/dL (ref 100–199)
HDL: 56 mg/dL (ref >39)
LDL Chol Calc (NIH): 93 mg/dL (ref 0–99)
Triglycerides: 116 mg/dL (ref 0–149)
VLDL Cholesterol Cal: 21 mg/dL (ref 5–40)

## 2023-12-05 LAB — LIPOPROTEIN A (LPA): Lipoprotein (a): 97.1 nmol/L — ABNORMAL HIGH (ref <75.0)

## 2023-12-05 LAB — CBC
Hematocrit: 44.7 % (ref 34.0–46.6)
Hemoglobin: 14.4 g/dL (ref 11.1–15.9)
MCH: 28 pg (ref 26.6–33.0)
MCHC: 32.2 g/dL (ref 31.5–35.7)
MCV: 87 fL (ref 79–97)
Platelets: 274 10*3/uL (ref 150–450)
RBC: 5.14 x10E6/uL (ref 3.77–5.28)
RDW: 13.1 % (ref 11.7–15.4)
WBC: 8.3 10*3/uL (ref 3.4–10.8)

## 2023-12-05 LAB — LDL CHOLESTEROL, DIRECT: LDL Direct: 101 mg/dL — ABNORMAL HIGH (ref 0–99)

## 2023-12-05 LAB — VITAMIN D 25 HYDROXY (VIT D DEFICIENCY, FRACTURES): Vit D, 25-Hydroxy: 49.1 ng/mL (ref 30.0–100.0)

## 2023-12-05 LAB — VITAMIN B12: Vitamin B-12: 564 pg/mL (ref 232–1245)

## 2023-12-11 ENCOUNTER — Other Ambulatory Visit: Payer: Self-pay

## 2023-12-11 DIAGNOSIS — R7989 Other specified abnormal findings of blood chemistry: Secondary | ICD-10-CM

## 2023-12-29 ENCOUNTER — Ambulatory Visit: Admitting: Family Medicine

## 2024-01-18 ENCOUNTER — Telehealth: Payer: Self-pay | Admitting: Family Medicine

## 2024-01-18 MED ORDER — METOPROLOL SUCCINATE ER 200 MG PO TB24: 200.0000 mg | ORAL_TABLET | Freq: Every day | ORAL | 3 refills | Status: AC

## 2024-01-18 NOTE — Addendum Note (Signed)
 Addended by: Cassondra Stachowski E on: 01/18/2024 02:31 PM   Modules accepted: Orders

## 2024-01-18 NOTE — Telephone Encounter (Signed)
 refilled

## 2024-01-18 NOTE — Telephone Encounter (Signed)
 [facility] following medications:  metoprolol  (TOPROL -XL) 200 MG 24 hr tablet    Please advise

## 2024-02-06 ENCOUNTER — Telehealth: Payer: Self-pay | Admitting: Family Medicine

## 2024-02-06 ENCOUNTER — Other Ambulatory Visit: Payer: Self-pay

## 2024-02-06 MED ORDER — ROSUVASTATIN CALCIUM 40 MG PO TABS: 40.0000 mg | ORAL_TABLET | Freq: Every day | ORAL | 3 refills | Status: AC

## 2024-02-06 MED ORDER — SERTRALINE HCL 50 MG PO TABS: 150.0000 mg | ORAL_TABLET | Freq: Every day | ORAL | 3 refills | Status: AC

## 2024-02-06 NOTE — Telephone Encounter (Signed)
 Rosuvastatin  has been refilled, routed other medication to provider.

## 2024-02-06 NOTE — Telephone Encounter (Addendum)
 [facility] for the following medications:     sertraline  (ZOLOFT ) 50 MG tablet    rosuvastatin  (CRESTOR ) 40 MG tablet [   Please advise

## 2024-04-07 ENCOUNTER — Telehealth: Payer: Self-pay | Admitting: Family Medicine

## 2024-04-07 DIAGNOSIS — R7989 Other specified abnormal findings of blood chemistry: Secondary | ICD-10-CM

## 2024-04-07 NOTE — Telephone Encounter (Signed)
 Please advise pt it is time to recheck liver functions since they were a little elevated in March. Order has been placed, she does not need to be fasting.

## 2024-04-08 NOTE — Telephone Encounter (Signed)
 Left message to call us  back. I have also send her a [employer]

## 2024-04-24 ENCOUNTER — Other Ambulatory Visit: Payer: Self-pay | Admitting: Family Medicine

## 2024-05-21 DIAGNOSIS — R7989 Other specified abnormal findings of blood chemistry: Secondary | ICD-10-CM | POA: Diagnosis not present

## 2024-05-22 ENCOUNTER — Ambulatory Visit: Payer: Self-pay | Admitting: Family Medicine

## 2024-05-22 ENCOUNTER — Ambulatory Visit: Admitting: Family Medicine

## 2024-05-22 ENCOUNTER — Encounter: Payer: Self-pay | Admitting: Family Medicine

## 2024-05-22 VITALS — BP 113/74 | HR 69 | Ht 64.0 in | Wt 177.9 lb

## 2024-05-22 DIAGNOSIS — F339 Major depressive disorder, recurrent, unspecified: Secondary | ICD-10-CM

## 2024-05-22 DIAGNOSIS — E538 Deficiency of other specified B group vitamins: Secondary | ICD-10-CM

## 2024-05-22 DIAGNOSIS — R7989 Other specified abnormal findings of blood chemistry: Secondary | ICD-10-CM | POA: Diagnosis not present

## 2024-05-22 DIAGNOSIS — E782 Mixed hyperlipidemia: Secondary | ICD-10-CM | POA: Diagnosis not present

## 2024-05-22 DIAGNOSIS — E559 Vitamin D deficiency, unspecified: Secondary | ICD-10-CM

## 2024-05-22 DIAGNOSIS — K760 Fatty (change of) liver, not elsewhere classified: Secondary | ICD-10-CM | POA: Diagnosis not present

## 2024-05-22 DIAGNOSIS — Z124 Encounter for screening for malignant neoplasm of cervix: Secondary | ICD-10-CM

## 2024-05-22 DIAGNOSIS — Z8679 Personal history of other diseases of the circulatory system: Secondary | ICD-10-CM

## 2024-05-22 DIAGNOSIS — I10 Essential (primary) hypertension: Secondary | ICD-10-CM | POA: Diagnosis not present

## 2024-05-22 DIAGNOSIS — Z1211 Encounter for screening for malignant neoplasm of colon: Secondary | ICD-10-CM

## 2024-05-22 DIAGNOSIS — Z1231 Encounter for screening mammogram for malignant neoplasm of breast: Secondary | ICD-10-CM

## 2024-05-22 LAB — HEPATIC FUNCTION PANEL
ALT: 58 IU/L — ABNORMAL HIGH (ref 0–32)
AST: 38 IU/L (ref 0–40)
Albumin: 4.1 g/dL (ref 3.8–4.9)
Alkaline Phosphatase: 128 IU/L — ABNORMAL HIGH (ref 44–121)
Bilirubin Total: 0.2 mg/dL (ref 0.0–1.2)
Bilirubin, Direct: 0.09 mg/dL (ref 0.00–0.40)
Total Protein: 7 g/dL (ref 6.0–8.5)

## 2024-05-22 LAB — LIPID PANEL
Chol/HDL Ratio: 3.2 ratio (ref 0.0–4.4)
Cholesterol, Total: 175 mg/dL (ref 100–199)
HDL: 55 mg/dL (ref >39)
LDL Chol Calc (NIH): 95 mg/dL (ref 0–99)
Triglycerides: 142 mg/dL (ref 0–149)
VLDL Cholesterol Cal: 25 mg/dL (ref 5–40)

## 2024-05-22 NOTE — Patient Instructions (Addendum)
 Please contact [phone] to schedule your mammogram. You will be asked your location preference to have procedure performed. You have two options listed below.  1) [facility] located at [address] [place], [place] [address] 2) [facility] located at [address] [place], [place] [address]  Upon results being received our office will contact you. As well as all results can be viewed through your MyChart. Please feel free to contact us  if you have any further questions or concerns.    I recommend that you get the Prevnar 20 vaccine to protect yourself from certain dangerous strains of pneumonia. You can get Prevnar 20 at your pharmacy, or call our office at [phone] at your earliest convenience to schedule this vaccine.

## 2024-05-22 NOTE — Progress Notes (Signed)
 Established patient visit   Patient: Kelly Stephenson   DOB: 1971-08-27   53 y.o. Female  MRN: [id number] Visit Date: 05/22/2024  Today's healthcare provider: Nancyann Perry, MD   Chief Complaint  Patient presents with   Medical Management of Chronic Issues   Hypertension    Patient reports she has not monitored at home for the last month. She reports no smoking. No symptoms to report.    Subjective    Discussed the use of AI scribe software for clinical note transcription with the patient, who gave verbal consent to proceed.  History of Present Illness   Kelly Stephenson is a 53 year old female with history of ICH who presents for a routine follow-up visit.  She feels generally well but reports increased sleep and fatigue, though not worse than usual. She has not monitored her blood pressure at home for about a month due to her dog damaging the equipment.  She is currently taking rosuvastatin  and Zetia  for cholesterol management. Her recent lab results showed LDL cholesterol now at 95, and it had been over 200 previously. S  She is taking sertraline  (Zoloft ) for anxiety and depression and feels it is effective, although she experiences increased anxiety when she misses doses.  Her last Pap smear was possibly in 2016. She received a Cologuard box about a year ago but has not used it yet. She has no family history of colon cancer.  She has applied for disability and is awaiting updates, with the last information indicating a request for medical records.     Last metabolic panel Lab Results  Component Value Date   GLUCOSE 92 12/04/2023   NA 141 12/04/2023   K 3.9 12/04/2023   CL 102 12/04/2023   CO2 24 12/04/2023   BUN 24 12/04/2023   CREATININE 0.92 12/04/2023   EGFR 75 12/04/2023   CALCIUM  9.7 12/04/2023   PROT 7.0 05/21/2024   ALBUMIN 4.1 05/21/2024   LABGLOB 2.4 12/04/2023   AGRATIO 1.7 01/26/2023   BILITOT 0.2 05/21/2024   ALKPHOS 128 (H) 05/21/2024   AST 38  05/21/2024   ALT 58 (H) 05/21/2024   ANIONGAP 9 10/13/2022     Medications: Outpatient Medications Prior to Visit  Medication Sig   acetaminophen  (TYLENOL ) 325 MG tablet Take 2 tablets (650 mg total) by mouth every 4 (four) hours as needed for mild pain.   Cyanocobalamin  (B-12 PO) Take 1 tablet by mouth daily.   ezetimibe  (ZETIA ) 10 MG tablet TAKE 1 TABLET BY MOUTH EVERY DAY   loratadine  (CLARITIN ) 10 MG tablet Take 1 tablet (10 mg total) by mouth daily.   losartan -hydrochlorothiazide (HYZAAR) 50-12.5 MG tablet Take 1 tablet by mouth daily.   magnesium  oxide (MAG-OX) 400 MG tablet Take 1 tablet (400 mg total) by mouth at bedtime. (Patient taking differently: Take 500 mg by mouth at bedtime.)   metoprolol  (TOPROL -XL) 200 MG 24 hr tablet Take 1 tablet (200 mg total) by mouth daily.   pantoprazole  (PROTONIX ) 40 MG tablet Take 1 tablet (40 mg total) by mouth daily.   rosuvastatin  (CRESTOR ) 40 MG tablet Take 1 tablet (40 mg total) by mouth daily.   sertraline  (ZOLOFT ) 50 MG tablet Take 3 tablets (150 mg total) by mouth daily.   Vitamin D , Ergocalciferol , (DRISDOL ) 1.25 MG (50000 UNIT) CAPS capsule Take 1 capsule (50,000 Units total) by mouth every 7 (seven) days.   fluticasone  (FLONASE ) 50 MCG/ACT nasal spray Place 1 spray into both nostrils  daily. (Patient not taking: Reported on 05/22/2024)   melatonin 3 MG TABS tablet Take 1 tablet (3 mg total) by mouth at bedtime. (Patient not taking: Reported on 05/22/2024)   No facility-administered medications prior to visit.   Review of Systems  Constitutional:  Negative for appetite change, chills, fatigue and fever.  Respiratory:  Negative for chest tightness and shortness of breath.   Cardiovascular:  Negative for chest pain and palpitations.  Gastrointestinal:  Negative for abdominal pain, nausea and vomiting.  Neurological:  Negative for dizziness and weakness.       Objective    BP 113/74 (BP Location: Left Arm, Patient Position: Sitting,  Cuff Size: Normal)   Pulse 69   Ht 5' 4 (1.626 m)   Wt 177 lb 14.4 oz (80.7 kg)   SpO2 99%   BMI 30.54 kg/m   Physical Exam   General: Appearance:    Mildly obese female in no acute distress  Eyes:    PERRL, conjunctiva/corneas clear, EOM's intact       Lungs:     Clear to auscultation bilaterally, respirations unlabored  Heart:    Normal heart rate. Normal rhythm. No murmurs, rubs, or gallops.    MS:   All extremities are intact.    Neurologic:   Awake, alert, oriented x 3. No apparent focal neurological defect.        Assessment & Plan    1. Mixed hyperlipidemia (Primary) She is tolerating rosuvastatin  and ezetimibe  well with no adverse effects.    2. Primary hypertension Well controlled.  Continue current medications.    3. Elevated LFTs Mostly normalized  4. Hepatic steatosis  Fibrosis 4 Score = .95 (Low risk)        Interpretation for patients with NAFLD          <1.30       -  F0-F1 (Low risk)          1.30-2.67 -  Indeterminate           >2.67      -  F3-F4 (High risk)     Validated for ages 51-65     5. Colon cancer screening  - Cologuard  6. Depression, recurrent (HCC) In remission well controlled on current dose of sertraline  which will be continued.   7. Encounter for screening mammogram for malignant neoplasm of breast  - MM 3D SCREENING MAMMOGRAM BILATERAL BREAST; Future  8. Cervical cancer screening  - Ambulatory referral to Obstetrics / Gynecology  9. History of intracranial hemorrhage Continue aggressive risk factor management.   Return in about 1 year (around 05/22/2025) for Hypertension.     Nancyann Perry, MD  [facility] [phone] (phone) [phone] (fax)  [facility]

## 2024-07-20 ENCOUNTER — Other Ambulatory Visit: Payer: Self-pay | Admitting: Family Medicine

## 2024-08-10 ENCOUNTER — Other Ambulatory Visit: Payer: Self-pay | Admitting: Family Medicine

## 2024-08-10 DIAGNOSIS — E559 Vitamin D deficiency, unspecified: Secondary | ICD-10-CM

## 2025-05-26 ENCOUNTER — Ambulatory Visit: Admitting: Family Medicine
